# Patient Record
Sex: Female | Born: 1940 | Race: White | Hispanic: No | Smoking: Former smoker
Health system: Southern US, Community
[De-identification: ages and names within clinical notes are randomized; demographics above are authoritative.]

## PROBLEM LIST (undated history)

## (undated) DIAGNOSIS — M1990 Unspecified osteoarthritis, unspecified site: Secondary | ICD-10-CM

## (undated) DIAGNOSIS — J309 Allergic rhinitis, unspecified: Secondary | ICD-10-CM

## (undated) DIAGNOSIS — J449 Chronic obstructive pulmonary disease, unspecified: Secondary | ICD-10-CM

## (undated) DIAGNOSIS — M8580 Other specified disorders of bone density and structure, unspecified site: Secondary | ICD-10-CM

## (undated) DIAGNOSIS — N3281 Overactive bladder: Secondary | ICD-10-CM

## (undated) DIAGNOSIS — C4492 Squamous cell carcinoma of skin, unspecified: Secondary | ICD-10-CM

## (undated) DIAGNOSIS — I10 Essential (primary) hypertension: Secondary | ICD-10-CM

## (undated) DIAGNOSIS — G43909 Migraine, unspecified, not intractable, without status migrainosus: Secondary | ICD-10-CM

## (undated) DIAGNOSIS — F410 Panic disorder [episodic paroxysmal anxiety] without agoraphobia: Secondary | ICD-10-CM

## (undated) HISTORY — DX: Other specified disorders of bone density and structure, unspecified site: M85.80

## (undated) HISTORY — DX: Allergic rhinitis, unspecified: J30.9

## (undated) HISTORY — DX: Unspecified osteoarthritis, unspecified site: M19.90

## (undated) HISTORY — PX: CHOLECYSTECTOMY: SHX55

## (undated) HISTORY — DX: Migraine, unspecified, not intractable, without status migrainosus: G43.909

## (undated) HISTORY — DX: Chronic obstructive pulmonary disease, unspecified: J44.9

## (undated) HISTORY — PX: TOTAL ABDOMINAL HYSTERECTOMY W/ BILATERAL SALPINGOOPHORECTOMY: SHX83

## (undated) HISTORY — DX: Squamous cell carcinoma of skin, unspecified: C44.92

## (undated) HISTORY — DX: Overactive bladder: N32.81

## (undated) HISTORY — DX: Essential (primary) hypertension: I10

## (undated) HISTORY — DX: Panic disorder (episodic paroxysmal anxiety): F41.0

---

## 2002-02-05 ENCOUNTER — Encounter: Payer: Self-pay | Admitting: Family Medicine

## 2002-02-05 ENCOUNTER — Encounter: Admission: RE | Admit: 2002-02-05 | Discharge: 2002-02-05 | Payer: Self-pay | Admitting: Family Medicine

## 2002-06-19 ENCOUNTER — Encounter: Payer: Self-pay | Admitting: Family Medicine

## 2002-06-19 ENCOUNTER — Encounter: Admission: RE | Admit: 2002-06-19 | Discharge: 2002-06-19 | Payer: Self-pay | Admitting: Family Medicine

## 2003-06-21 ENCOUNTER — Encounter: Admission: RE | Admit: 2003-06-21 | Discharge: 2003-06-21 | Payer: Self-pay | Admitting: Family Medicine

## 2003-12-14 ENCOUNTER — Emergency Department (HOSPITAL_COMMUNITY): Admission: EM | Admit: 2003-12-14 | Discharge: 2003-12-14 | Payer: Self-pay | Admitting: Emergency Medicine

## 2004-07-29 ENCOUNTER — Ambulatory Visit: Payer: Self-pay | Admitting: Pulmonary Disease

## 2004-12-03 ENCOUNTER — Encounter: Admission: RE | Admit: 2004-12-03 | Discharge: 2004-12-03 | Payer: Self-pay | Admitting: Family Medicine

## 2004-12-10 ENCOUNTER — Encounter: Admission: RE | Admit: 2004-12-10 | Discharge: 2004-12-10 | Payer: Self-pay | Admitting: Family Medicine

## 2005-01-19 ENCOUNTER — Ambulatory Visit (HOSPITAL_COMMUNITY): Admission: RE | Admit: 2005-01-19 | Discharge: 2005-01-19 | Payer: Self-pay | Admitting: *Deleted

## 2005-01-19 ENCOUNTER — Encounter (INDEPENDENT_AMBULATORY_CARE_PROVIDER_SITE_OTHER): Payer: Self-pay | Admitting: *Deleted

## 2005-02-18 ENCOUNTER — Ambulatory Visit: Payer: Self-pay | Admitting: Pulmonary Disease

## 2005-07-27 ENCOUNTER — Encounter: Admission: RE | Admit: 2005-07-27 | Discharge: 2005-07-27 | Payer: Self-pay | Admitting: Psychiatry

## 2005-11-11 ENCOUNTER — Ambulatory Visit: Payer: Self-pay | Admitting: Pulmonary Disease

## 2005-12-25 ENCOUNTER — Encounter: Admission: RE | Admit: 2005-12-25 | Discharge: 2005-12-25 | Payer: Self-pay | Admitting: Family Medicine

## 2006-01-19 ENCOUNTER — Ambulatory Visit: Payer: Self-pay | Admitting: Pulmonary Disease

## 2006-01-21 ENCOUNTER — Ambulatory Visit: Payer: Self-pay | Admitting: Pulmonary Disease

## 2006-06-14 ENCOUNTER — Ambulatory Visit: Payer: Self-pay | Admitting: Pulmonary Disease

## 2006-09-25 ENCOUNTER — Emergency Department (HOSPITAL_COMMUNITY): Admission: EM | Admit: 2006-09-25 | Discharge: 2006-09-26 | Payer: Self-pay | Admitting: Emergency Medicine

## 2006-11-11 ENCOUNTER — Ambulatory Visit: Payer: Self-pay | Admitting: Pulmonary Disease

## 2006-12-28 ENCOUNTER — Encounter: Admission: RE | Admit: 2006-12-28 | Discharge: 2006-12-28 | Payer: Self-pay | Admitting: Family Medicine

## 2007-02-28 ENCOUNTER — Ambulatory Visit: Payer: Self-pay | Admitting: Emergency Medicine

## 2007-04-06 ENCOUNTER — Ambulatory Visit: Payer: Self-pay | Admitting: Emergency Medicine

## 2007-04-06 DIAGNOSIS — J449 Chronic obstructive pulmonary disease, unspecified: Secondary | ICD-10-CM

## 2007-07-14 HISTORY — PX: OTHER SURGICAL HISTORY: SHX169

## 2007-12-19 ENCOUNTER — Ambulatory Visit: Payer: Self-pay | Admitting: Internal Medicine

## 2007-12-30 ENCOUNTER — Encounter: Admission: RE | Admit: 2007-12-30 | Discharge: 2007-12-30 | Payer: Self-pay | Admitting: Family Medicine

## 2008-01-16 ENCOUNTER — Ambulatory Visit: Payer: Self-pay | Admitting: Emergency Medicine

## 2008-03-14 ENCOUNTER — Encounter: Payer: Self-pay | Admitting: Emergency Medicine

## 2008-05-29 ENCOUNTER — Ambulatory Visit: Payer: Self-pay | Admitting: Emergency Medicine

## 2008-06-26 ENCOUNTER — Ambulatory Visit (HOSPITAL_BASED_OUTPATIENT_CLINIC_OR_DEPARTMENT_OTHER): Admission: RE | Admit: 2008-06-26 | Discharge: 2008-06-26 | Payer: Self-pay | Admitting: Urology

## 2008-06-26 ENCOUNTER — Encounter: Payer: Self-pay | Admitting: Urology

## 2008-07-13 HISTORY — PX: OTHER SURGICAL HISTORY: SHX169

## 2008-09-11 ENCOUNTER — Ambulatory Visit: Payer: Self-pay | Admitting: Emergency Medicine

## 2008-12-31 ENCOUNTER — Encounter: Admission: RE | Admit: 2008-12-31 | Discharge: 2008-12-31 | Payer: Self-pay | Admitting: Family Medicine

## 2009-02-22 ENCOUNTER — Ambulatory Visit: Payer: Self-pay | Admitting: Emergency Medicine

## 2009-08-20 ENCOUNTER — Ambulatory Visit: Payer: Self-pay | Admitting: Emergency Medicine

## 2009-08-27 ENCOUNTER — Telehealth (INDEPENDENT_AMBULATORY_CARE_PROVIDER_SITE_OTHER): Payer: Self-pay | Admitting: *Deleted

## 2009-09-05 ENCOUNTER — Encounter: Payer: Self-pay | Admitting: Emergency Medicine

## 2009-09-23 ENCOUNTER — Encounter: Payer: Self-pay | Admitting: Emergency Medicine

## 2010-01-14 ENCOUNTER — Encounter: Admission: RE | Admit: 2010-01-14 | Discharge: 2010-01-14 | Payer: Self-pay | Admitting: Family Medicine

## 2010-02-19 ENCOUNTER — Ambulatory Visit: Payer: Self-pay | Admitting: Emergency Medicine

## 2010-04-10 ENCOUNTER — Telehealth (INDEPENDENT_AMBULATORY_CARE_PROVIDER_SITE_OTHER): Payer: Self-pay | Admitting: *Deleted

## 2010-04-24 ENCOUNTER — Telehealth (INDEPENDENT_AMBULATORY_CARE_PROVIDER_SITE_OTHER): Payer: Self-pay | Admitting: *Deleted

## 2010-07-10 ENCOUNTER — Encounter: Payer: Self-pay | Admitting: Emergency Medicine

## 2010-07-17 ENCOUNTER — Telehealth: Payer: Self-pay | Admitting: Emergency Medicine

## 2010-07-17 ENCOUNTER — Ambulatory Visit
Admission: RE | Admit: 2010-07-17 | Discharge: 2010-07-17 | Payer: Self-pay | Source: Home / Self Care | Attending: Emergency Medicine | Admitting: Emergency Medicine

## 2010-07-17 DIAGNOSIS — J9611 Chronic respiratory failure with hypoxia: Secondary | ICD-10-CM | POA: Insufficient documentation

## 2010-07-17 DIAGNOSIS — J9610 Chronic respiratory failure, unspecified whether with hypoxia or hypercapnia: Secondary | ICD-10-CM

## 2010-08-01 ENCOUNTER — Ambulatory Visit
Admission: RE | Admit: 2010-08-01 | Discharge: 2010-08-01 | Payer: Self-pay | Source: Home / Self Care | Attending: Emergency Medicine | Admitting: Emergency Medicine

## 2010-08-05 ENCOUNTER — Ambulatory Visit
Admission: RE | Admit: 2010-08-05 | Discharge: 2010-08-05 | Payer: Self-pay | Source: Home / Self Care | Attending: Emergency Medicine | Admitting: Emergency Medicine

## 2010-08-12 NOTE — Progress Notes (Signed)
Summary: prescript  for Advair---90 day supply  Phone Note Call from Patient   Caller: Patient Call For: byrum Summary of Call: pt is doing good on advair 250/50 would like prescript called to [facility]. [facility] [phone] Initial call taken by: Rickard Patience,  April 24, 2010 9:40 AM  Follow-up for Phone Call        called and spoke with pt.  per phone note on 04/10/2010, RB recommended pt restart Advair 250/50 and call us back in 2 weeks with feedback.  Pt states her breathing and cough have improved on Advair.  Pt requests a 90 day supply sent to her local pharmacy.  Will forward message to RB to make sure ok to send rx.  Aundra Millet Reynolds LPN  April 24, 2010 10:40 AM   Thank you, would go ahead and sent script Leslye Peer MD  April 24, 2010 11:20 AM  Follow-up by: Leslye Peer MD,  April 24, 2010 11:20 AM  Additional Follow-up for Phone Call Additional follow up Details #1::        rx sent to pharmacy.  pt aware.  Aundra Millet Reynolds LPN  April 24, 2010 11:48 AM     Prescriptions: ADVAIR DISKUS 250-50 MCG/DOSE  MISC (FLUTICASONE-SALMETEROL) inhale 1 puff two times a day  #180 x 3   Entered by:   Arman Filter LPN   Authorized by:   Leslye Peer MD   Signed by:   Arman Filter LPN on 42/59/5638   Method used:   [facility] to        [facility]  [phone]* (retail)       [address] [place], [place]  [address]       Ph: [phone] or [phone]       Fax: [phone]   RxID:   [id number]

## 2010-08-12 NOTE — Progress Notes (Signed)
Summary: resume using advair? coughing/ SOB  Phone Note Call from Patient Call back at [facility] Phone [phone]   Caller: Patient Call For: BYRUM Summary of Call: pt has begun using her inhaler again (which she hasn't had to use in yrs). also coughing again and having trouble breathing while in shower. wants to discuss getting back on advair.  [facility] Initial call taken by: Tivis Ringer, CNA,  April 10, 2010 9:43 AM  Follow-up for Phone Call        Called and spoke with pt.  She states that since she stopped the advair, she has had increased SOB with exertion and now has developed cough- mainly dry but sometimes produces clear sputum.  She has had to use rescue inhaler at least two times a day the past wk.  Would like to discuss starting back on advair.  Pls advise thanks Follow-up by: Vernie Murders,  April 10, 2010 10:42 AM  Additional Follow-up for Phone Call Additional follow up Details #1::        Please ask patient to restart her Advair, then call us in 2 weeks to see if she improves. Thanks Leslye Peer MD  April 10, 2010 5:07 PM  Additional Follow-up by: Leslye Peer MD,  April 10, 2010 5:07 PM    Additional Follow-up for Phone Call Additional follow up Details #2::    Called and spoke with pt and advised her to restart her advair. I advised pt to call us back in 2 weeks to give [place] feedback on how well she is doing since starting back advair. Pt advised me she would.  Carver Fila  April 10, 2010 5:11 PM

## 2010-08-12 NOTE — Assessment & Plan Note (Signed)
Summary: COPD   Visit Type:  Follow-up Primary Provider/Referring Provider:  Ehinger  CC:  COPD.  She says her breathing is doing well. No complaints today.Marland Kitchen  History of Present Illness: 70 year old female with severe COPD maintained on Advair and Spiriva.  Has been on exertional O2.  ROV 02/22/09 -- regular visit, has been doing well. Wearing her O2 with exertion. Taking Spiriva and Advair. Still very active. Hasn't restarted cigarettes, quit 4/09.  No flares or exacerbations since last visit.  Rarely uses as needed SABA.   ROV 08/20/09 -- Returns feeling well. Hasn't restarted smoking!  Hasn't been using her O2 with exertion. Using Spiriva and Advair.  No exacerbations since last visit. Able to exert without limitations. No wheezing. Sometimes has cough, usually related to nasal drainage. Hasn't needed her SABA.   ROV 02/19/10 -- regular f/u for COPD. She has been doing well, able to exert, do yardwork. Not smoking. No exacerbations.      Current Medications (verified): 1)  Advair Diskus 250-50 Mcg/dose  Misc (Fluticasone-Salmeterol) .... Inhale 1 Puff Two Times A Day 2)  Spiriva Handihaler 18 Mcg  Caps (Tiotropium Bromide Monohydrate) .... Inhale 1 Capule Once Daily 3)  Multivitamins   Tabs (Multiple Vitamin) .... Take 1 Tablet By Mouth Once A Day 4)  Calcium 600 600 Mg  Tabs (Calcium Carbonate) .... Take 1 Tablet By Mouth Once A Day 5)  Vitamin D3 2000 Unit Caps (Cholecalciferol) .Marland Kitchen.. 1 By Mouth Daily  Allergies (verified): 1)  ! Erythromycin 2)  ! Ceclor 3)  ! Biaxin 4)  ! Iodine  Vital Signs:  Patient profile:   70 year old female Height:      66 inches (167.64 cm) Weight:      132 pounds (60.00 kg) BMI:     21.38 O2 Sat:      94 % on Room air Temp:     98.3 degrees F (36.83 degrees C) oral Pulse rate:   89 / minute BP sitting:   134 / 82  (left arm) Cuff size:   regular  Vitals Entered By: Michel Bickers CMA (February 19, 2010 10:49 AM)  O2 Sat at Rest %:  94 O2 Flow:   Room air CC: COPD.  She says her breathing is doing well. No complaints today. Comments Medications reviewed with the patient. Daytime phone verified. Michel Bickers CMA  February 19, 2010 10:49 AM   Physical Exam  General:  well developed, well nourished, in no acute distress Head:  normocephalic and atraumatic Eyes:  conjunctiva and sclera clear Nose:  no deformity, discharge, inflammation, or lesions Mouth:  no exudate Neck:  no masses, thyromegaly, or abnormal cervical nodes Lungs:  distant but clear, no wheeze Heart:  regular rate and rhythm, S1, S2 without murmurs, rubs, gallops, or clicks Abdomen:  not examined Msk:  no deformity or scoliosis noted with normal posture Extremities:  trace pretibial edema.  Neurologic:  non-focal.  Skin:  intact without lesions or rashes Psych:  alert and cooperative; normal mood and affect; normal attention span and concentration   Medications Added to Medication List This Visit: 1)  Proair Hfa 108 (90 Base) Mcg/act Aers (Albuterol sulfate) .... 2 puffs q4h as needed for sob  Other Orders: Est. Patient Level IV ([address]) Prescription Created Electronically [phone])  Patient Instructions: 1)  Continue your Spiriva daily 2)  Stop your Advair for now. We will see how you do off this medication.  3)  If your breathing worsens off  the Advair, call our office so we can discuss restarting.  4)  Use ProAir as needed.  5)  Flu shot in the Fall.  6)  Follow up with Dr Delton Coombes in 6 months or as needed.  Prescriptions: PROAIR HFA 108 (90 BASE) MCG/ACT AERS (ALBUTEROL SULFATE) 2 puffs q4h as needed for SOB  #1 x 5   Entered and Authorized by:   Leslye Peer MD   Signed by:   Leslye Peer MD on 02/19/2010   Method used:   [facility] to        [facility]  [phone][facility] (retail)       [address] [place], [place]  [address]       Ph: [phone] or [phone]       Fax: [phone]   RxID:   [id number]

## 2010-08-12 NOTE — Assessment & Plan Note (Signed)
Summary: COPD   Visit Type:  Follow-up Primary Provider/Referring Provider:  Ehinger  CC:  COPD. The patient states her breathing is doing great. No changes..  History of Present Illness: 70 year old female with severe COPD maintained on Advair and Spiriva.  Has been on exertional O2.  ROV 02/22/09 -- regular visit, has been doing well. Wearing her O2 with exertion. Taking Spiriva and Advair. Still very active. Hasn't restarted cigarettes, quit 4/09.  No flares or exacerbations since last visit.  Rarely uses as needed SABA.   ROV 08/20/09 -- Returns feeling well. Hasn't restarted smoking!  Hasn't been using her O2 with exertion. Using Spiriva and Advair.  No exacerbations since last visit. Able to exert without limitations. No wheezing. Sometimes has cough, usually related to nasal drainage. Hasn't needed her SABA.      Current Medications (verified): 1)  Advair Diskus 250-50 Mcg/dose  Misc (Fluticasone-Salmeterol) .... Inhale 1 Puff Two Times A Day 2)  Spiriva Handihaler 18 Mcg  Caps (Tiotropium Bromide Monohydrate) .... Inhale 1 Capule Once Daily 3)  Multivitamins   Tabs (Multiple Vitamin) .... Take 1 Tablet By Mouth Once A Day 4)  Calcium 600 600 Mg  Tabs (Calcium Carbonate) .... Take 1 Tablet By Mouth Once A Day 5)  Proventil Hfa 108 (90 Base) Mcg/act  Aers (Albuterol Sulfate) .... 2 Puffs Every 4 Hours As Needed For Shortness of Breath 6)  Vitamin D3 2000 Unit Caps (Cholecalciferol) .Marland Kitchen.. 1 By Mouth Daily  Allergies (verified): 1)  ! Erythromycin 2)  ! Ceclor 3)  ! Biaxin 4)  ! Iodine  Vital Signs:  Patient profile:   70 year old female Height:      66 inches (167.64 cm) Weight:      134.50 pounds (61.14 kg) BMI:     21.79 O2 Sat:      98 % on Room air Temp:     97.7 degrees F (36.50 degrees C) oral Pulse rate:   90 / minute BP sitting:   112 / 74  (left arm) Cuff size:   regular  Vitals Entered By: Michel Bickers CMA (August 20, 2009 10:15 AM)  O2 Sat at Rest %:  98 O2  Flow:  Room air  Serial Vital Signs/Assessments:  Comments: 10:59 AM Ambulatory Pulse Oximetry  Resting; HR__90___    02 Sat_____ 97 Lap1 (185 feet)   HR__90___   02 Sat__96___ Lap2 (185 feet)   HR__105___   02 Sat__94___    Lap3 (185 feet)   HR___106_   02 Sat__94___  _x__Test Completed without Difficulty ___Test Stopped due to:  By: Michel Bickers CMA    Physical Exam  General:  well developed, well nourished, in no acute distress Head:  normocephalic and atraumatic Eyes:  conjunctiva and sclera clear Nose:  no deformity, discharge, inflammation, or lesions Mouth:  no exudate Neck:  no masses, thyromegaly, or abnormal cervical nodes Lungs:  distant but clear, no wheeze Heart:  regular rate and rhythm, S1, S2 without murmurs, rubs, gallops, or clicks Abdomen:  not examined Msk:  no deformity or scoliosis noted with normal posture Extremities:  trace pretibial edema.  Neurologic:  non-focal.  Skin:  intact without lesions or rashes Psych:  alert and cooperative; normal mood and affect; normal attention span and concentration   Impression & Recommendations:  Problem # 1:  COPD (ICD-496) Well-managed, but not clear to me that she should be exerting on RA.  - repeat walking oximetry today and discuss her O2  use - same BD's - f/u 6 months or as needed.   Medications Added to Medication List This Visit: 1)  Vitamin D3 2000 Unit Caps (Cholecalciferol) .Marland Kitchen.. 1 by mouth daily  Other Orders: Est. Patient Level III (09323)  Patient Instructions: 1)  Continue your Spiriva and Advair as your are taking them.  2)  Walking oximetry today showed that your level does not drop with exertion. 3)  Use your Proventil as needed  4)  Follow up with Dr Delton Coombes in 6 months or as needed.

## 2010-08-12 NOTE — Progress Notes (Signed)
Summary: Records request from [facility]  Request for records received from [facility]. Request forwarded to [employer]. Wilder Glade  August 27, 2009 12:55 PM

## 2010-08-12 NOTE — Miscellaneous (Signed)
Summary: Oxygen Recertification Notice  Oxygen Recertification Notice   Imported By: Lenard Forth 11/08/2009 12:32:34  _____________________________________________________________________  External Attachment:    Type:   Image     Comment:   External Document

## 2010-08-12 NOTE — Letter (Signed)
Summary: CMN for Oxygen/[facility]  CMN for Oxygen/[facility]   Imported By: Sherian Rein 09/25/2009 12:20:50  _____________________________________________________________________  External Attachment:    Type:   Image     Comment:   External Document

## 2010-08-13 ENCOUNTER — Ambulatory Visit (INDEPENDENT_AMBULATORY_CARE_PROVIDER_SITE_OTHER): Payer: Medicare Other | Admitting: Adult Health

## 2010-08-13 ENCOUNTER — Encounter: Payer: Self-pay | Admitting: Adult Health

## 2010-08-13 DIAGNOSIS — J9610 Chronic respiratory failure, unspecified whether with hypoxia or hypercapnia: Secondary | ICD-10-CM

## 2010-08-13 DIAGNOSIS — J449 Chronic obstructive pulmonary disease, unspecified: Secondary | ICD-10-CM

## 2010-08-14 NOTE — Assessment & Plan Note (Signed)
Summary: Acute NP office visit - COPD   Primary Provider/Referring Provider:  Ehinger  CC:  prod cough with yellow mucus and sore throat x49month - was given amoxicillin by PCP and pred taper by MW.  states she has improved but is not back to baseline.Marland Kitchen  History of Present Illness: 67 yowf quit smoking 2009  with severe COPD maintained on Advair and Spiriva.  Has been on exertional O2.  ROV 02/22/09 -- regular visit, has been doing well. Wearing her O2 with exertion. Taking Spiriva and Advair. Still very active. Hasn't restarted cigarettes, quit 4/09.  No flares or exacerbations since last visit.  Rarely uses as needed SABA.   ROV 08/20/09 -- Returns feeling well. Hasn't restarted smoking!  Hasn't been using her O2 with exertion. Using Spiriva and Advair.  No exacerbations since last visit. Able to exert without limitations. No wheezing. Sometimes has cough, usually related to nasal drainage. Hasn't needed her SABA.   ROV 02/19/10 -- regular f/u for COPD. She has been doing well, able to exert, do yardwork. Not smoking. No exacerbations. Continue your Spiriva daily Stop your Advair for now. We will see how you do off this medication.  If your breathing worsens off the Advair,  restarted  Use ProAir as needed.  July 17, 2010 ov acute onset x 8 days drippy nose worse short of breath.  forgetting am advair but consistent with spiriva. hasn't taken proaire yet as per Dr Kavin Leech instructions ? why?  rx by Ehinger with amox > mucus less yelow, still coughing violently and used up a half bottle of hycodan already.>>tx w/ steorid taper , changed to [facility]  August 01, 2010--Returns for an acute office visit. Complains of prod cough with yellow mucus, sore throat x50month - was given amoxicillin by PCP and pred taper by MW.  states she has improved but is not back to baseline. Still cough alot. Prednisone helped alot w/ decreased cough. Denies chest pain,  orthopnea, hemoptysis, fever, n/v/d, edema,  headache.   Medications Prior to Update: 1)  Spiriva Handihaler 18 Mcg  Caps (Tiotropium Bromide Monohydrate) .... Inhale 1 Capule Once Daily 2)  Multivitamins   Tabs (Multiple Vitamin) .... Take 1 Tablet By Mouth Once A Day 3)  Calcium 600 600 Mg  Tabs (Calcium Carbonate) .... Take 1 Tablet By Mouth Once A Day 4)  Vitamin D3 2000 Unit Caps (Cholecalciferol) .Marland Kitchen.. 1 By Mouth Daily 5)  Proair Hfa 108 (90 Base) Mcg/act Aers (Albuterol Sulfate) .... 2 Puffs Q4h As Needed For Sob 6)  Alprazolam 0.5 Mg Tabs (Alprazolam) .Marland Kitchen.. 1 Every 6 Hrs As Needed 7)  Sumatriptan Succinate 100 Mg Tabs (Sumatriptan Succinate) .Marland Kitchen.. 1 Once Daily As Needed For Migraine 8)  Mucinex 600 Mg Xr12h-Tab (Guaifenesin) .Marland Kitchen.. 1 Every 12 Hrs As Needed 9)  Amoxicillin 500 Mg Caps (Amoxicillin) .... 2 Two Times A Day Per Dr Manus Gunning 10)  Hycodan Syrup .Marland Kitchen.. 1 Tsp Every 6 Hrs As Needed 11)  Dulera 200-5 Mcg/act Aero (Mometasone Furo-Formoterol Fum) .... 2 Puffs First Thing  in Am and 2 Puffs Again in Pm About 12 Hours Later 12)  Prednisone 10 Mg  Tabs (Prednisone) .... 4 Each Am X 2 Days,  3 X 2days, 2x2 Days, and 1x2 Days  Current Medications (verified): 1)  Spiriva Handihaler 18 Mcg  Caps (Tiotropium Bromide Monohydrate) .... Inhale 1 Capule Once Daily 2)  Multivitamins   Tabs (Multiple Vitamin) .... Take 1 Tablet By Mouth Once A Day 3)  Calcium  600 600 Mg  Tabs (Calcium Carbonate) .... Take 1 Tablet By Mouth Once A Day 4)  Vitamin D3 2000 Unit Caps (Cholecalciferol) .Marland Kitchen.. 1 By Mouth Daily 5)  Proair Hfa 108 (90 Base) Mcg/act Aers (Albuterol Sulfate) .... 2 Puffs Q4h As Needed For Sob 6)  Alprazolam 0.5 Mg Tabs (Alprazolam) .Marland Kitchen.. 1 Every 6 Hrs As Needed 7)  Sumatriptan Succinate 100 Mg Tabs (Sumatriptan Succinate) .Marland Kitchen.. 1 Once Daily As Needed For Migraine 8)  Mucinex 600 Mg Xr12h-Tab (Guaifenesin) .Marland Kitchen.. 1 Every 12 Hrs As Needed 9)  Hycodan Syrup .Marland Kitchen.. 1 Tsp Every 6 Hrs As Needed 10)  Dulera 200-5 Mcg/act Aero (Mometasone  Furo-Formoterol Fum) .... 2 Puffs First Thing  in Am and 2 Puffs Again in Pm About 12 Hours Later  Allergies (verified): 1)  ! Erythromycin 2)  ! Ceclor 3)  ! Biaxin 4)  ! Iodine  Past History:  Past Medical History: Last updated: 07/17/2010 COPD     - HFA 75% p coaching July 17, 2010   Family History: Last updated: December 31, 2007 mother died age 105 from cancer father died age 43 from heart attack 2 siblings 1 brother alive age 97  excellent health 1 sister  alive age 52 hx of heart problems and  cancer  Social History: Last updated: 08/01/2010 retired former smoker - quit 2009 married 3 children  Risk Factors: Smoking Status: quit (04/06/2007)  Social History: retired former smoker - quit 2009 married 3 children  Review of Systems      See HPI  Vital Signs:  Patient profile:   70 year old female Height:      66 inches Weight:      135.13 pounds BMI:     21.89 O2 Sat:      92 % on Room air Temp:     98.8 degrees F oral Pulse rate:   103 / minute BP sitting:   142 / 78  (left arm) Cuff size:   regular  Vitals Entered By: Boone Master CNA/MA (August 01, 2010 4:09 PM)  O2 Flow:  Room air CC: prod cough with yellow mucus, sore throat x8month - was given amoxicillin by PCP and pred taper by MW.  states she has improved but is not back to baseline. Is Patient Diabetic? No Comments Medications reviewed with patient Daytime contact number verified with patient. Boone Master CNA/MA  August 01, 2010 4:10 PM    Physical Exam  Additional Exam:  HEENT mild turbinate edema.  Oropharynx no thrush or excess pnd or cobblestoning.  No JVD or cervical adenopathy. Mild accessory muscle hypertrophy. Trachea midline, nl thryroid. Chest was hyperinflated by percussion with diminished breath sounds and moderate increased exp time without wheeze. Hoover sign positive at mid inspiration. Regular rate and rhythm without murmur gallop or rub or increase P2 or edema.  Abd: no  hsm, nl excursion. Ext warm without cyanosis or clubbing.     Impression & Recommendations:  Problem # 1:  COPD (ICD-496) Slow to resolve exacerbation  xray pending.  albuterol neb in office  Plan;  Prednisone taper over next week.  Mucinex two times a day for congestion Delsym 2 tsp two times a day as needed cough  Increase fliuids and rest.  Please contact office for sooner follow up if symptoms do not improve or worsen   Medications Added to Medication List This Visit: 1)  Prednisone 10 Mg Tabs (Prednisone) .... 4 tabs for 2 days, then 3 tabs for 2 days, 2  tabs for 2 days, then 1 tab for 2 days, then stop  Other Orders: T-2 View CXR (71020TC) Est. Patient Level IV ([address]109)  Patient Instructions: 1)  Prednisone taper over next week.  2)  Mucinex two times a day for congestion 3)  Delsym 2 tsp two times a day as needed cough  4)  Increase fliuids and rest.  5)  Please contact office for sooner follow up if symptoms do not improve or worsen  Prescriptions: PREDNISONE 10 MG TABS (PREDNISONE) 4 tabs for 2 days, then 3 tabs for 2 days, 2 tabs for 2 days, then 1 tab for 2 days, then stop  #20 x 0   Entered and Authorized by:   Rubye Oaks NP   Signed by:   Rubye Oaks NP on 08/01/2010   Method used:   [facility]        [facility]  [phone]* (retail)       [address] [place], [place]  [address]       Ph: [phone] or [phone]       Fax: [phone]   RxID:   [id number]    Immunization History:  Influenza Immunization History:    Influenza:  historical (05/24/2010)

## 2010-08-14 NOTE — Assessment & Plan Note (Signed)
Summary: COPD, URI   Visit Type:  Follow-up Primary Provider/Referring Provider:  Manus Gunning  CC:  Followup COPD after 2 acute visits this month.  Pt states that her breathing has improved but not quite back to baseline.  Still c/o prod cough with yellow sputum.  She states that she had fever 3 days ago.  Marland Kitchen  History of Present Illness: 79 yowf quit smoking 2009  with severe COPD maintained on Advair and Spiriva.  Has been on exertional O2.  ROV 02/19/10 -- regular f/u for COPD. She has been doing well, able to exert, do yardwork. Not smoking. No exacerbations. Continue your Spiriva daily Stop your Advair for now. We will see how you do off this medication.  If your breathing worsens off the Advair,  restarted  Use ProAir as needed.  July 17, 2010 ov acute onset x 8 days drippy nose worse short of breath.  forgetting am advair but consistent with spiriva. hasn't taken proaire yet as per Dr Kavin Leech instructions ? why?  rx by Ehinger with amox > mucus less yelow, still coughing violently and used up a half bottle of hycodan already.>>tx w/ steorid taper , changed to [facility]  August 01, 2010--Returns for an acute office visit. Complains of prod cough with yellow mucus, sore throat x32month - was given amoxicillin by PCP and pred taper by MW.  states she has improved but is not back to baseline. Still cough alot. Prednisone helped alot w/ decreased cough. Denies chest pain,  orthopnea, hemoptysis, fever, n/v/d, edema, headache.   ROV 08/05/10 -- severe COPD, exacerbated in the last month with apparent URI. Was treated initially with abx, then with steroid taper (finished mid Jan). Changed from Advair to [facility] for possible contributipon to coughing. Started another pred taper for continued URI signs, yellow sputum, yellow nasal congestion, occas nasal blood. Cough is slightly better.   Current Medications (verified): 1)  Multivitamins   Tabs (Multiple Vitamin) .... Take 1 Tablet By Mouth Once A  Day 2)  Calcium 600 600 Mg  Tabs (Calcium Carbonate) .... Take 1 Tablet By Mouth Once A Day 3)  Vitamin D3 2000 Unit Caps (Cholecalciferol) .Marland Kitchen.. 1 By Mouth Daily 4)  Proair Hfa 108 (90 Base) Mcg/act Aers (Albuterol Sulfate) .... 2 Puffs Q4h As Needed For Sob 5)  Alprazolam 0.5 Mg Tabs (Alprazolam) .Marland Kitchen.. 1 Every 6 Hrs As Needed 6)  Sumatriptan Succinate 100 Mg Tabs (Sumatriptan Succinate) .Marland Kitchen.. 1 Once Daily As Needed For Migraine 7)  Mucinex 600 Mg Xr12h-Tab (Guaifenesin) .Marland Kitchen.. 1 Every 12 Hrs As Needed 8)  Dulera 200-5 Mcg/act Aero (Mometasone Furo-Formoterol Fum) .... 2 Puffs First Thing  in Am and 2 Puffs Again in Pm About 12 Hours Later 9)  Prednisone 10 Mg Tabs (Prednisone) .... 4 Tabs For 2 Days, Then 3 Tabs For 2 Days, 2 Tabs For 2 Days, Then 1 Tab For 2 Days, Then Stop  Allergies (verified): 1)  ! Erythromycin 2)  ! Ceclor 3)  ! Biaxin 4)  ! Iodine  Vital Signs:  Patient profile:   70 year old female Weight:      135 pounds O2 Sat:      97 % on Room air Temp:     98.0 degrees F oral Pulse rate:   101 / minute BP sitting:   162 / 90  (left arm)  Vitals Entered By: Vernie Murders (August 05, 2010 12:04 PM)  O2 Flow:  Room air  Physical Exam  General:  well  developed, well nourished, in no acute distress Head:  normocephalic and atraumatic Eyes:  conjunctiva and sclera clear Nose:  no deformity, discharge, inflammation, or lesions Mouth:  no exudate Neck:  no masses, thyromegaly, or abnormal cervical nodes Lungs:  distant but clear, no wheeze Heart:  regular rate and rhythm, S1, S2 without murmurs, rubs, gallops, or clicks Abdomen:  not examined Msk:  no deformity or scoliosis noted with normal posture Extremities:  trace pretibial edema.  Neurologic:  non-focal.  Skin:  intact without lesions or rashes Psych:  alert and cooperative; normal mood and affect; normal attention span and concentration   Impression & Recommendations:  Problem # 1:  COPD (ICD-496) With  another URI, being rx for AE - finish pred - nasal hygiene - decongestants - rov 2 weeks  Other Orders: Est. Patient Level IV (16109)  Patient Instructions: 1)  Finish your prednisone.  2)  Continue the [facility] and Spiriva; we will consider changing back to Advar in the future.  3)  Use nasal saline washes once daily for 2 weeks 4)  Try decongestants that contain either bromphenerimine or chlorphenerimine as directed  5)  Follow with Dr Delton Coombes in 2 weeks

## 2010-08-14 NOTE — Assessment & Plan Note (Signed)
Summary: Pulmonary/ acute ext ov with hfa 75%   Primary Provider/Referring Provider:  Ehinger  CC:  Increased SOB and prod cough with yellow sputum and wheezing x 1 wk. .  History of Present Illness: 26 yowf quit smoking 2009  with severe COPD maintained on Advair and Spiriva.  Has been on exertional O2.  ROV 02/22/09 -- regular visit, has been doing well. Wearing her O2 with exertion. Taking Spiriva and Advair. Still very active. Hasn't restarted cigarettes, quit 4/09.  No flares or exacerbations since last visit.  Rarely uses as needed SABA.   ROV 08/20/09 -- Returns feeling well. Hasn't restarted smoking!  Hasn't been using her O2 with exertion. Using Spiriva and Advair.  No exacerbations since last visit. Able to exert without limitations. No wheezing. Sometimes has cough, usually related to nasal drainage. Hasn't needed her SABA.   ROV 02/19/10 -- regular f/u for COPD. She has been doing well, able to exert, do yardwork. Not smoking. No exacerbations. Continue your Spiriva daily Stop your Advair for now. We will see how you do off this medication.  If your breathing worsens off the Advair,  restarted  Use ProAir as needed.  July 17, 2010 ov acute onset x 8 days drippy nose worse short of breath.  forgetting am advair but consistent with spiriva. hasn't taken proaire yet as per Dr Kavin Leech instructions ? why?  rx by Ehinger with amox > mucus less yelow, still coughing violently and used up a half bottle of hycodan already. Pt denies any significant sore throat, dysphagia, itching, sneezing,  nasal congestion or excess secretions,  fever, chills, sweats, unintended wt loss, pleuritic or exertional cp, hempoptysis,  orthopnea pnd or leg swelling. Pt also denies any obvious fluctuation in symptoms with weather or environmental change or other alleviating or aggravating factors.          Current Medications (verified): 1)  Advair Diskus 250-50 Mcg/dose  Misc (Fluticasone-Salmeterol) ....  Inhale 1 Puff Two Times A Day 2)  Spiriva Handihaler 18 Mcg  Caps (Tiotropium Bromide Monohydrate) .... Inhale 1 Capule Once Daily 3)  Multivitamins   Tabs (Multiple Vitamin) .... Take 1 Tablet By Mouth Once A Day 4)  Calcium 600 600 Mg  Tabs (Calcium Carbonate) .... Take 1 Tablet By Mouth Once A Day 5)  Vitamin D3 2000 Unit Caps (Cholecalciferol) .Marland Kitchen.. 1 By Mouth Daily 6)  Proair Hfa 108 (90 Base) Mcg/act Aers (Albuterol Sulfate) .... 2 Puffs Q4h As Needed For Sob 7)  Alprazolam 0.5 Mg Tabs (Alprazolam) .Marland Kitchen.. 1 Every 6 Hrs As Needed 8)  Sumatriptan Succinate 100 Mg Tabs (Sumatriptan Succinate) .Marland Kitchen.. 1 Once Daily As Needed For Migraine 9)  Mucinex 600 Mg Xr12h-Tab (Guaifenesin) .Marland Kitchen.. 1 Every 12 Hrs As Needed 10)  Amoxicillin 500 Mg Caps (Amoxicillin) .... 2 Two Times A Day Per Dr Manus Gunning 11)  Hycodan Syrup .Marland Kitchen.. 1 Tsp Every 6 Hrs As Needed  Allergies (verified): 1)  ! Erythromycin 2)  ! Ceclor 3)  ! Biaxin 4)  ! Iodine  Past History:  Past Medical History: COPD     - HFA 75% p coaching July 17, 2010   Vital Signs:  Patient profile:   70 year old female Weight:      135.50 pounds O2 Sat:      93 % on 3 L/min pulsed Temp:     98.2 degrees F oral Pulse rate:   102 / minute BP sitting:   146 / 76  (left arm)  Vitals Entered By: Vernie Murders (July 17, 2010 11:55 AM)  O2 Flow:  3 L/min pulsed CC: Increased SOB, prod cough with yellow sputum and wheezing x 1 wk.    Physical Exam  Additional Exam:  very anxious amb wf pink puffer pattern breathing wt 234 > 132 July 17, 2010  HEENT mild turbinate edema.  Oropharynx no thrush or excess pnd or cobblestoning.  No JVD or cervical adenopathy. Mild accessory muscle hypertrophy. Trachea midline, nl thryroid. Chest was hyperinflated by percussion with diminished breath sounds and moderate increased exp time without wheeze. Hoover sign positive at mid inspiration. Regular rate and rhythm without murmur gallop or rub or increase P2 or  edema.  Abd: no hsm, nl excursion. Ext warm without cyanosis or clubbing.     Impression & Recommendations:  Problem # 1:  COPD (ICD-496)   DDX of  difficult airways managment all start with A and  include Adherence, Ace Inhibitors, Acid Reflux, Active Sinus Disease, Alpha 1 Antitripsin deficiency, Anxiety masquerading as Airways dz,  ABPA,  allergy(esp in young), Aspiration (esp in elderly), Adverse effects of DPI,  Active smokers, plus one B  = Beta blocker use..    Adherence:  not following Dr Kavin Leech instructions, not yet on proaire as needed, not yet used the advair Rec trial of dulera 200 .  I spent extra time with the patient today explaining optimal mdi  technique.  This improved from  50-75% p coaching  ? adverse effect of Advair (excess cough in setting of relatively benign uri) > try off  ? acid reflux (excess cough) > short term rx.  Problem # 2:  RESPIRATORY FAILURE, CHRONIC (ICD-518.83) Relatively well compensated despite aecopd  Medications Added to Medication List This Visit: 1)  Alprazolam 0.5 Mg Tabs (Alprazolam) .Marland Kitchen.. 1 every 6 hrs as needed 2)  Sumatriptan Succinate 100 Mg Tabs (Sumatriptan succinate) .Marland Kitchen.. 1 once daily as needed for migraine 3)  Mucinex 600 Mg Xr12h-tab (Guaifenesin) .Marland Kitchen.. 1 every 12 hrs as needed 4)  Amoxicillin 500 Mg Caps (Amoxicillin) .... 2 two times a day per dr Manus Gunning 5)  Hycodan Syrup  .Marland Kitchen.. 1 tsp every 6 hrs as needed 6)  Dulera 200-5 Mcg/act Aero (Mometasone furo-formoterol fum) .... 2 puffs first thing  in am and 2 puffs again in pm about 12 hours later 7)  Prednisone 10 Mg Tabs (Prednisone) .... 4 each am x 2 days,  3 x 2days, 2x2 days, and 1x2 days  Other Orders: Prescription Created Electronically [id number]) Est. Patient Level IV (60454) HFA Instruction [id number])  Patient Instructions: 1)  Stop advair for now  2)  Start dulera 200 2 puffs first thing  in am and 2 puffs again in pm about 12 hours later and Work on inhaler technique:  relax  and blow all the way out then take a nice smooth deep breath back in, triggering the inhaler at same time you start breathing in  3)  Prednisone x 8 days taper 4)  Prilosec before bfast and pepcid 20 mg at bedtime as long as coughing ( reflux is to cough what oxygen is to fire)  5)  Dr Delton Coombes 2-3 weeks sooner if needed Prescriptions: PREDNISONE 10 MG  TABS (PREDNISONE) 4 each am x 2 days,  3 x 2days, 2x2 days, and 1x2 days  #20 x 0   Entered and Authorized by:   Nyoka Cowden MD   Signed by:   Nyoka Cowden MD on 07/17/2010  Method used:   Electronically to        [facility]  [phone]* (retail)       [address] [place], [place]  [address]       Ph: [phone] or [phone]       Fax: [phone]   RxID:   [id number]

## 2010-08-14 NOTE — Progress Notes (Signed)
Summary: sick  Phone Note Call from Patient Call back at Home Phone [phone]   Caller: Patient Call For: byrum Reason for Call: Talk to Nurse Summary of Call: Patient went to primary care doctor last week for what she thought was a cold and was prescribed abx. She still has 2 days remaining. Patient states she has not gotten any better--cough, sob, wheezing, chest tightness.  Patient calling requesting appt.   Initial call taken by: Lehman Prom,  July 17, 2010 8:40 AM  Follow-up for Phone Call        pt states she saw her pcp x 1 week ago for chest congestion, productive cough, and SOB and he gave hre rx for amoxicilin x10days. pt has completed 7 days of the course with no relief. She states she feel sworse. She c/o increased SOb and chest congestion and she has increased the use of her home O2 as well.  pt is requesting a n appt, so I set her to see MW today at 11:45. Carron Curie CMA  July 17, 2010 9:05 AM

## 2010-08-14 NOTE — Letter (Signed)
Summary: [facility]   Imported By: Sherian Rein 07/29/2010 15:50:09  _____________________________________________________________________  External Attachment:    Type:   Image     Comment:   External Document

## 2010-08-28 ENCOUNTER — Encounter: Payer: Self-pay | Admitting: Emergency Medicine

## 2010-08-28 ENCOUNTER — Ambulatory Visit (INDEPENDENT_AMBULATORY_CARE_PROVIDER_SITE_OTHER): Payer: Medicare Other | Admitting: Emergency Medicine

## 2010-08-28 DIAGNOSIS — J069 Acute upper respiratory infection, unspecified: Secondary | ICD-10-CM

## 2010-08-28 DIAGNOSIS — J449 Chronic obstructive pulmonary disease, unspecified: Secondary | ICD-10-CM

## 2010-08-28 NOTE — Assessment & Plan Note (Addendum)
Summary: Acute NP office visit - COPD   Vital Signs:  Patient profile:   70 year old female Height:      66 inches Weight:      138.50 pounds BMI:     22.44 O2 Sat:      95 % on Room air Temp:     99.3 degrees F oral Pulse rate:   94 / minute BP sitting:   136 / 80  (left arm) Cuff size:   regular  Vitals Entered By: Boone Master CNA/MA (August 13, 2010 11:37 AM)  O2 Flow:  Room air CC: still sick - states not coughing as much but mucus has turned green in color, low grade temp, sinus congestion - states she thinks she began declining again after finishing the pred taper.  denies wheezing, SOB Is Patient Diabetic? No Comments Medications reviewed with patient Daytime contact number verified with patient. Boone Master CNA/MA  August 13, 2010 11:38 AM    Primary Provider/Referring Provider:  Manus Gunning  CC:  still sick - states not coughing as much but mucus has turned green in color, low grade temp, sinus congestion - states she thinks she began declining again after finishing the pred taper.  denies wheezing, and SOB.  History of Present Illness: 89 yowf quit smoking 2009  with severe COPD maintained on Advair and Spiriva.  Has been on exertional O2. 2009>> FEV1 of1.35 or 59% of predicted, an FVC of 3.1 or 98% of predicted, and a ratio of 43% consistent with severe airflow limitation.  ROV 02/19/10 -- regular f/u for COPD. She has been doing well, able to exert, do yardwork. Not smoking. No exacerbations. Continue your Spiriva daily Stop your Advair for now. We will see how you do off this medication.  If your breathing worsens off the Advair,  restarted  Use ProAir as needed.  July 17, 2010 ov acute onset x 8 days drippy nose worse short of breath.  forgetting am advair but consistent with spiriva. hasn't taken proaire yet as per Dr Kavin Leech instructions ? why?  rx by Ehinger with amox > mucus less yelow, still coughing violently and used up a half bottle of hycodan  already.>>tx w/ steorid taper , changed to [facility]  August 01, 2010--Returns for an acute office visit. Complains of prod cough with yellow mucus, sore throat x3month - was given amoxicillin by PCP and pred taper by MW.  states she has improved but is not back to baseline. Still cough alot. Prednisone helped alot w/ decreased cough.   ROV 08/05/10 -- severe COPD, exacerbated in the last month with apparent URI. Was treated initially with abx, then with steroid taper (finished mid Jan). Changed from Advair to [facility] for possible contributipon to coughing. Started another pred taper for continued URI signs, yellow sputum, yellow nasal congestion, occas nasal blood. Cough is slightly better.  August 13, 2010 --Returns for persistent symptoms of cough and congestion. Seen several times over last month for bronchitic symptoms with wheezing , cough/congeseiton. Tx w/ steroid taper x2 , amoxicilin. She improves some but never totally resolves. Now over last few days cough is worse with thick green mucus. Sinus drainge andcongestion with low grade fever. CXR 2 weeks ago with no acute changes. Has been using saline rinse that does help. Denies chest pain,  orthopnea, hemoptysis, n/v/d, edema, headache.    Medications Prior to Update: 1)  Dulera 200-5 Mcg/act Aero (Mometasone Furo-Formoterol Fum) .... 2 Puffs First Thing  in Am and 2 Puffs Again in Pm About 12 Hours Later 2)  Multivitamins   Tabs (Multiple Vitamin) .... Take 1 Tablet By Mouth Once A Day 3)  Calcium 600 600 Mg  Tabs (Calcium Carbonate) .... Take 1 Tablet By Mouth Once A Day 4)  Vitamin D3 2000 Unit Caps (Cholecalciferol) .Marland Kitchen.. 1 By Mouth Daily 5)  Proair Hfa 108 (90 Base) Mcg/act Aers (Albuterol Sulfate) .... 2 Puffs Q4h As Needed For Sob 6)  Alprazolam 0.5 Mg Tabs (Alprazolam) .Marland Kitchen.. 1 Every 6 Hrs As Needed 7)  Sumatriptan Succinate 100 Mg Tabs (Sumatriptan Succinate) .Marland Kitchen.. 1 Once Daily As Needed For Migraine 8)  Mucinex 600 Mg Xr12h-Tab  (Guaifenesin) .Marland Kitchen.. 1 Every 12 Hrs As Needed 9)  Prednisone 10 Mg Tabs (Prednisone) .... 4 Tabs For 2 Days, Then 3 Tabs For 2 Days, 2 Tabs For 2 Days, Then 1 Tab For 2 Days, Then Stop  Current Medications (verified): 1)  Dulera 200-5 Mcg/act Aero (Mometasone Furo-Formoterol Fum) .... 2 Puffs First Thing  in Am and 2 Puffs Again in Pm About 12 Hours Later 2)  Multivitamins   Tabs (Multiple Vitamin) .... Take 1 Tablet By Mouth Once A Day 3)  Calcium 600 600 Mg  Tabs (Calcium Carbonate) .... Take 1 Tablet By Mouth Once A Day 4)  Vitamin D3 2000 Unit Caps (Cholecalciferol) .Marland Kitchen.. 1 By Mouth Daily 5)  Proair Hfa 108 (90 Base) Mcg/act Aers (Albuterol Sulfate) .... 2 Puffs Q4h As Needed For Sob 6)  Alprazolam 0.5 Mg Tabs (Alprazolam) .Marland Kitchen.. 1 Every 6 Hrs As Needed 7)  Sumatriptan Succinate 100 Mg Tabs (Sumatriptan Succinate) .Marland Kitchen.. 1 Once Daily As Needed For Migraine 8)  Mucinex 600 Mg Xr12h-Tab (Guaifenesin) .Marland Kitchen.. 1 Every 12 Hrs As Needed 9)  Levaquin 750 Mg Tabs (Levofloxacin) .Marland Kitchen.. 1 By Mouth Once Daily 10)  Spiriva Handihaler 18 Mcg Caps (Tiotropium Bromide Monohydrate) .... Inhale Contents of 1 Capsule Once A Day  Allergies (verified): 1)  ! Erythromycin 2)  ! Ceclor 3)  ! Biaxin 4)  ! Iodine  Past History:  Past Medical History: Last updated: 07/17/2010 COPD     - HFA 75% p coaching July 17, 2010   Family History: Last updated: 12/21/2007 mother died age 63 from cancer father died age 69 from heart attack 2 siblings 1 brother alive age 18  excellent health 1 sister  alive age 36 hx of heart problems and  cancer  Social History: Last updated: 08/01/2010 retired former smoker - quit 2009 married 3 children  Risk Factors: Smoking Status: quit (04/06/2007)  Review of Systems      See HPI  Physical Exam  Additional Exam:  GEN: A/Ox3; pleasant , NAD HEENT:  [place]/AT, , EACs-clear, TMs-wnl, NOSE-clea drainage sinus tenderness , THROAT-clear NECK:  Supple w/ fair ROM; no JVD;  normal carotid impulses w/o bruits; no thyromegaly or nodules palpated; no lymphadenopathy. RESP  Coarse BS w/no wheezing noted. CARD:  RRR, no m/r/g   GI:   Soft & nt; nml bowel sounds; no organomegaly or masses detected. [place]: Warm bil,  no calf tenderness edema, clubbing, pulses intact Neuro:intact w/ no focal deficitis noted.   Impression & Recommendations:  Problem # 1:  COPD (ICD-496)  Slow to resolve exacerbation with possible associated sinusitis Plan:  Levaquin 750mg  once daily for 7 days Mucinex two times a day as needed congestion  Delsym as needed cough  Increase water intake, rest  Tylenol as needed  Saline nasal rinse two  times a day  Please contact office for sooner follow up if symptoms do not improve or worsen  follow up Dr. Delton Coombes in 2 weeks as planned.   Orders: Est. Patient Level IV ([address])  Medications Added to Medication List This Visit: 1)  Levaquin 750 Mg Tabs (Levofloxacin) .Marland Kitchen.. 1 by mouth once daily 2)  Spiriva Handihaler 18 Mcg Caps (Tiotropium bromide monohydrate) .... Inhale contents of 1 capsule once a day  Patient Instructions: 1)  Levaquin 750mg  once daily for 7 days 2)  Mucinex two times a day as needed congestion  3)  Delsym as needed cough  4)  Increase water intake, rest  5)  Tylenol as needed  6)  Saline nasal rinse two times a day  7)  Please contact office for sooner follow up if symptoms do not improve or worsen  8)  follow up Dr. Delton Coombes in 2 weeks as planned.  Prescriptions: LEVAQUIN 750 MG TABS (LEVOFLOXACIN) 1 by mouth once daily  #7 x 0   Entered and Authorized by:   Rubye Oaks NP   Signed by:   Rubye Oaks NP on 08/13/2010   Method used:   [facility] to        [facility]  [phone]* (retail)       [address] [place], [place]  [address]       Ph: [phone] or [phone]       Fax: [phone]   RxID:   [id number]    Orders Added: 1)  Est. Patient Level IV [03474]

## 2010-09-03 NOTE — Assessment & Plan Note (Signed)
Summary: URI, COPD   Visit Type:  Follow-up Primary Provider/Referring Provider:  Ehinger  CC:  f/u 2 wks.-started last pm, temp 100(last pm), sorethroat, cough-yellow, no energy, no sob, and nowheezing.  History of Present Illness: 70 yowf quit smoking 2009  with severe COPD maintained on Advair and Spiriva.  Has been on exertional O2. 2009>> FEV1 of1.35 or 59% of predicted, an FVC of 3.1 or 98% of predicted, and a ratio of 43% consistent with severe airflow limitation.  July 17, 2010 ov acute onset x 8 days drippy nose worse short of breath.  forgetting am advair but consistent with spiriva. hasn't taken proaire yet as per Dr Kavin Leech instructions ? why?  rx by Ehinger with amox > mucus less yelow, still coughing violently and used up a half bottle of hycodan already.>>tx w/ steorid taper , changed to [facility]  August 01, 2010--Returns for an acute office visit. Complains of prod cough with yellow mucus, sore throat x43month - was given amoxicillin by PCP and pred taper by MW.  states she has improved but is not back to baseline. Still cough alot. Prednisone helped alot w/ decreased cough.   ROV 08/05/10 -- severe COPD, exacerbated in the last month with apparent URI. Was treated initially with abx, then with steroid taper (finished mid Jan). Changed from Advair to [facility] for possible contributipon to coughing. Started another pred taper for continued URI signs, yellow sputum, yellow nasal congestion, occas nasal blood. Cough is slightly better.   August 13, 2010 --Returns for persistent symptoms of cough and congestion. Seen several times over last month for bronchitic symptoms with wheezing , cough/congeseiton. Tx w/ steroid taper x2 , amoxicilin. She improves some but never totally resolves. Now over last few days cough is worse with thick green mucus. Sinus drainge and congestion with low grade fever. CXR 2 weeks ago with no acute changes. Has been using saline rinse that does help. Denies  chest pain,  orthopnea, hemoptysis, n/v/d, edema, headache.   08/28/10 -- hx severe COPD, allergies. Has been treated with several rounds of pred and abx for persistent symptoms. The residual symptoms and nasal and sinus congestion, clear drainage, sore throat started last night. Breathing is OK, no wheezing. Changed back to Advair from [facility] a week ago. Has not been requiring ProAir. Not using decongestants. She is using mucinex.   Preventive Screening-Counseling & Management  Alcohol-Tobacco     Smoking Status: quit     Year Quit: 2009  Current Medications (verified): 1)  Multivitamins   Tabs (Multiple Vitamin) .... Take 1 Tablet By Mouth Once A Day 2)  Calcium 600 600 Mg  Tabs (Calcium Carbonate) .... Take 1 Tablet By Mouth Once A Day 3)  Vitamin D3 2000 Unit Caps (Cholecalciferol) .Marland Kitchen.. 1 By Mouth Daily 4)  Proair Hfa 108 (90 Base) Mcg/act Aers (Albuterol Sulfate) .... 2 Puffs Q4h As Needed For Sob 5)  Alprazolam 0.5 Mg Tabs (Alprazolam) .Marland Kitchen.. 1 Every 6 Hrs As Needed 6)  Sumatriptan Succinate 100 Mg Tabs (Sumatriptan Succinate) .Marland Kitchen.. 1 Once Daily As Needed For Migraine 7)  Spiriva Handihaler 18 Mcg Caps (Tiotropium Bromide Monohydrate) .... Inhale Contents of 1 Capsule Once A Day 8)  Advair Diskus 250-50 Mcg/dose Aepb (Fluticasone-Salmeterol) .Marland Kitchen.. 1 Puff Two Times A Day  Allergies (verified): 1)  ! Erythromycin 2)  ! Ceclor 3)  ! Biaxin 4)  ! Iodine  Past History:  Family History: Last updated: 12/29/07 mother died age 48 from cancer father died age  56 from heart attack 2 siblings 1 brother alive age 1  excellent health 1 sister  alive age 66 hx of heart problems and  cancer  Social History: Last updated: 08/01/2010 retired former smoker - quit 2009 married 3 children  Risk Factors: Smoking Status: quit (08/28/2010)  Vital Signs:  Patient profile:   71 year old female Height:      66 inches Weight:      134.25 pounds O2 Sat:      96 % on Room air Temp:     98.4  degrees F oral Pulse rate:   76 / minute BP sitting:   112 / 80  (left arm) Cuff size:   regular  Vitals Entered By: Elray Buba RN (August 28, 2010 10:43 AM)  O2 Flow:  Room air CC: f/u 2 wks.-started last pm,temp 100(last pm),sorethroat,cough-yellow,no energy,no sob,nowheezing Is Patient Diabetic? No Comments Medications reviewed with patient ,phone # verified. Elray Buba RN  August 28, 2010 10:44 AM    Physical Exam  General:  well developed, well nourished, in no acute distress Head:  normocephalic and atraumatic Eyes:  conjunctiva and sclera clear Nose:  congested, clear drainage Mouth:  no exudate, + erythema Neck:  no masses, thyromegaly, or abnormal cervical nodes Lungs:  distant but clear, no wheeze Heart:  regular rate and rhythm, S1, S2 without murmurs, rubs, gallops, or clicks Abdomen:  not examined Msk:  no deformity or scoliosis noted with normal posture Extremities:  trace pretibial edema.  Neurologic:  non-focal.  Skin:  intact without lesions or rashes Psych:  alert and cooperative; normal mood and affect; normal attention span and concentration   Impression & Recommendations:  Problem # 1:  UPPER RESPIRATORY INFECTION (ICD-465.9)  Continue nasal saline washes two times a day  Start using OTC decongestants that contain clorphenerimine or bromphenerimine as directed up to every 4 hours If your symptoms persist w may decide to perform a CT scan of your sinuses Use your Spiriva, Advair as you are doing Follow up with Dr Delton Coombes in 2 -3 weeks.   The following medications were removed from the medication list:    Mucinex 600 Mg Xr12h-tab (Guaifenesin) .Marland Kitchen... 1 every 12 hrs as needed  Orders: Est. Patient Level IV ([address])  Problem # 2:  COPD (ICD-496) No current evidence flare, but high risk for this to occur  Medications Added to Medication List This Visit: 1)  Advair Diskus 250-50 Mcg/dose Aepb (Fluticasone-salmeterol) .Marland Kitchen.. 1 puff two times a  day  Patient Instructions: 1)  Continue nasal saline washes two times a day  2)  Start using OTC decongestants that contain clorphenerimine or bromphenerimine as directed up to every 4 hours 3)  If your symptoms persist w may decide to perform a CT scan of your sinuses 4)  Use your Spiriva, Advair as you are doing 5)  Follow up with Dr Delton Coombes in 2 -3 weeks.

## 2010-09-16 ENCOUNTER — Ambulatory Visit: Payer: Medicare Other | Admitting: Emergency Medicine

## 2010-09-22 ENCOUNTER — Encounter: Payer: Self-pay | Admitting: Emergency Medicine

## 2010-09-22 ENCOUNTER — Ambulatory Visit (INDEPENDENT_AMBULATORY_CARE_PROVIDER_SITE_OTHER): Payer: Medicare Other | Admitting: Emergency Medicine

## 2010-09-22 DIAGNOSIS — J449 Chronic obstructive pulmonary disease, unspecified: Secondary | ICD-10-CM

## 2010-09-30 NOTE — Assessment & Plan Note (Signed)
Summary: COPD   Visit Type:  Follow-up Primary Provider/Referring Provider:  Ehinger  CC:  COPD...pt says her breathing is doing well and has no complaints today.Marland Kitchen  History of Present Illness: 58 yowf quit smoking 2009  with severe COPD maintained on Advair and Spiriva.  Has been on exertional O2. 2009>> FEV1 of1.35 or 59% of predicted, an FVC of 3.1 or 98% of predicted, and a ratio of 43% consistent with severe airflow limitation.  July 17, 2010 ov acute onset x 8 days drippy nose worse short of breath.  forgetting am advair but consistent with spiriva. hasn't taken proaire yet as per Dr Kavin Leech instructions ? why?  rx by Ehinger with amox > mucus less yelow, still coughing violently and used up a half bottle of hycodan already.>>tx w/ steorid taper , changed to [facility]  August 01, 2010--Returns for an acute office visit. Complains of prod cough with yellow mucus, sore throat x24month - was given amoxicillin by PCP and pred taper by MW.  states she has improved but is not back to baseline. Still cough alot. Prednisone helped alot w/ decreased cough.   ROV 08/05/10 -- severe COPD, exacerbated in the last month with apparent URI. Was treated initially with abx, then with steroid taper (finished mid Jan). Changed from Advair to [facility] for possible contributipon to coughing. Started another pred taper for continued URI signs, yellow sputum, yellow nasal congestion, occas nasal blood. Cough is slightly better.   August 13, 2010 --Returns for persistent symptoms of cough and congestion. Seen several times over last month for bronchitic symptoms with wheezing , cough/congeseiton. Tx w/ steroid taper x2 , amoxicilin. She improves some but never totally resolves. Now over last few days cough is worse with thick green mucus. Sinus drainge and congestion with low grade fever. CXR 2 weeks ago with no acute changes. Has been using saline rinse that does help. Denies chest pain,  orthopnea, hemoptysis,  n/v/d, edema, headache.   08/28/10 -- hx severe COPD, allergies. Has been treated with several rounds of pred and abx for persistent symptoms. The residual symptoms and nasal and sinus congestion, clear drainage, sore throat started last night. Breathing is OK, no wheezing. Changed back to Advair from [facility] a week ago. Has not been requiring ProAir. Not using decongestants. She is using mucinex.   09/22/10 -- COPD, allergies, cough. Took about a week before she felt better. Used OTC decongestants. On Advair and Spiriva. Not requiring ProAir.   Preventive Screening-Counseling & Management  Alcohol-Tobacco     Smoking Status: quit  Current Medications (verified): 1)  Multivitamins   Tabs (Multiple Vitamin) .... Take 1 Tablet By Mouth Once A Day 2)  Calcium 600 600 Mg  Tabs (Calcium Carbonate) .... Take 1 Tablet By Mouth Once A Day 3)  Vitamin D3 2000 Unit Caps (Cholecalciferol) .Marland Kitchen.. 1 By Mouth Daily 4)  Proair Hfa 108 (90 Base) Mcg/act Aers (Albuterol Sulfate) .... 2 Puffs Q4h As Needed For Sob 5)  Alprazolam 0.5 Mg Tabs (Alprazolam) .Marland Kitchen.. 1 Every 6 Hrs As Needed 6)  Sumatriptan Succinate 100 Mg Tabs (Sumatriptan Succinate) .Marland Kitchen.. 1 Once Daily As Needed For Migraine 7)  Spiriva Handihaler 18 Mcg Caps (Tiotropium Bromide Monohydrate) .... Inhale Contents of 1 Capsule Once A Day 8)  Advair Diskus 250-50 Mcg/dose Aepb (Fluticasone-Salmeterol) .Marland Kitchen.. 1 Puff Two Times A Day  Allergies (verified): 1)  ! Erythromycin 2)  ! Ceclor 3)  ! Biaxin 4)  ! Iodine  Vital Signs:  Patient  profile:   70 year old female Height:      66 inches (167.64 cm) Weight:      137.13 pounds (62.33 kg) BMI:     22.21 O2 Sat:      96 % on Room air Temp:     98.3 degrees F (36.83 degrees C) oral Pulse rate:   87 / minute BP sitting:   136 / 74  (left arm) Cuff size:   regular  Vitals Entered By: Michel Bickers CMA (September 22, 2010 11:02 AM)  O2 Sat at Rest %:  96 O2 Flow:  Room air CC: COPD...pt says her breathing is  doing well and has no complaints today. Comments Medications reviewed with patient Michel Bickers CMA  September 22, 2010 11:07 AM   Physical Exam  General:  well developed, well nourished, in no acute distress Head:  normocephalic and atraumatic Eyes:  conjunctiva and sclera clear Nose:  no deformity, discharge, inflammation, or lesions Mouth:  no exudate,  Neck:  no masses, thyromegaly, or abnormal cervical nodes Lungs:  distant but clear, no wheeze Heart:  regular rate and rhythm, S1, S2 without murmurs, rubs, gallops, or clicks Abdomen:  not examined Msk:  no deformity or scoliosis noted with normal posture Extremities:  trace pretibial edema.  Neurologic:  non-focal.  Skin:  intact without lesions or rashes Psych:  alert and cooperative; normal mood and affect; normal attention span and concentration   Impression & Recommendations:  Problem # 1:  COPD (ICD-496) Continue your Spiriva and Advair  Use ProAir as needed  Follow up with Dr Delton Coombes in 6 months or as needed   Other Orders: Est. Patient Level III (16109)  Patient Instructions: 1)  Continue your Spiriva and Advair  2)  Use ProAir as needed  3)  Follow up with Dr Delton Coombes in 6 months or as needed

## 2010-11-25 NOTE — Op Note (Signed)
NAMECharmaine, [name]                 ACCOUNT NO.:  [id number]   MEDICAL RECORD NO.:  [id number]          PATIENT TYPE:  AMB   LOCATION:  [facility]                         FACILITY:  [facility]   PHYSICIAN:  Excell Seltzer. Annabell Howells, M.D.    DATE OF BIRTH:  Aug 20, 1940   DATE OF PROCEDURE:  06/26/2008  DATE OF DISCHARGE:                               OPERATIVE REPORT   PROCEDURE:  Cystoscopy, bilateral retrograde pyelograms with  interpretation, multiple bladder biopsies with fulguration.   PREOPERATIVE DIAGNOSIS:  Bladder tumor.   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   SURGEON:  Dr. Bjorn Pippin.   ANESTHESIA:  General.   SPECIMEN:  Biopsies from the bladder neck, left trigone, posterior wall,  right dome and left dome.   DRAINS:  18-French Foley catheter.   COMPLICATIONS:  None.   INDICATIONS:  [name] is a 70 year old white female who originally  presented with frequency and urgency with presumed history of recurrent  urinary tract infection.  She was found to have a negative urine, but  cystoscopy in the office revealed a papillary tumor near the left  trigone and was felt to require TURBT.  The ureteral orifice was not  apparent.  It was felt that retrograde pyelography was indicated.   FINDINGS OF THE PROCEDURE:  The patient was taken to the operating room  where general anesthetic was induced.  She was placed in lithotomy  position.  She was prepped with chlorhexidine solution because of an  iodine allergy.  She was draped in usual sterile fashion.  A time out  was performed.  She had been given Cipro, and PAS hose had been fitted.  A paralytic agent was given for the procedure as well.   Once positioned, cystoscopy was performed using the 22-French scope and  12 and 70 degrees lenses.  Examination revealed a normal urethra.  The  bladder wall had mild trabeculation.  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  There were  additional areas on the posterior wall and dome that were of concern.   The retrograde pyelograms were performed using a 5-French open-end  catheter and Omnipaque.   The left retrograde pyelogram revealed an unremarkable ureter and  intrarenal collecting system.   The right retrograde pyelogram revealed an unremarkable ureter and  intrarenal collecting system.   After completion of retrograde pyelogram, a cup biopsy forceps was used  to take biopsies from the left trigone, the posterior wall, the left and  right dome, and the bladder neck.  Attempt was made to take biopsies  adjacent to the left ureteral orifice.  However, I was unable to get  sufficient tissue.   Once the biopsies had been obtained, the biopsy sites were fulgurated  with a Bugbee electrode.   Once hemostasis achieved, an 18-French Foley  catheter was inserted.  The  balloon was filled with 10 mL of sterile fluid.  The catheter was placed  to straight drainage.  The patient's anesthetic was reversed.  She was  taken to recovery room in stable condition.  There were no  complications.      Excell Seltzer. Annabell Howells, M.D.  Electronically Signed     JJW/MEDQ  D:  06/26/2008  T:  06/26/2008  Job:  [id number]

## 2010-11-25 NOTE — Assessment & Plan Note (Signed)
[facility]                             PULMONARY OFFICE NOTE   Ariel Gonzalez                        MRN:          [id number]  DATE:02/28/2007                            DOB:          Feb 09, 1941    SUBJECTIVE:  Ariel Gonzalez is a 70 year old woman who has been followed by  Dr. Gailen Shelter for COPD.  She quit smoking approximately 2 years  ago and has been maintained on Advair 250/50 one inhalation b.i.d. and  Spiriva once daily.  She also uses DuoNeb on a p.r.n. basis.  She tells  me that she has been doing fairly well with decent exertional tolerance.  She is able to do her usual activities.  Over the last 2 days, she feels  that she has begun to develop some upper respiratory type symptoms with  some nasal congestion and nasal drainage.  This morning she began to  have some cough productive of greenish phlegm and some associated chest  tightness.  She has also had some low grade fevers.   CURRENT MEDICATIONS:  1. Advair 250/50, one inhalation b.i.d.  2. Spiriva one inhalation daily.  3. Multivitamin once daily.  4. Calcium 600 mg daily.  5. DuoNeb one inhaled nebulizer p.r.n.   PHYSICAL EXAMINATION:  GENERAL:  This is a well-appearing thin woman who  is in no distress on room air.  VITAL SIGNS:  Her weight is 126 pounds, temperature is 98.8, blood  pressure 120/80, heart rate 91, SpO2 92% on room air.  HEENT:  Benign.  NECK:  Has no lymphadenopathy or stridor.  LUNGS:  Distant but clear.  She has no wheezing in the normal  respiratory cycle or on a forced expiration.  HEART:  Regular without murmur.  ABDOMEN:  Benign.  EXTREMITIES:  No cyanosis, clubbing, or edema.   IMPRESSION:  Chronic obstructive pulmonary disease with a possible early  bronchitic flare in the setting of upper respiratory infection.   PLAN:  1. I will continue her Spiriva and Advair as her maintenance regimen.  2. I will give her a prescription for doxycycline  100 mg b.i.d. for 7      days.  She will fill this prescription if she continues to have      purulent sputum over the next couple of days.  I will not use any      prednisone at this time given the fact that she has no wheezing on      exam.  3. I will repeat her pulmonary function tests at her next visit after      this exacerbation has resolved.  4. I will add albuterol to be used on an as needed basis and      discontinue her DuoNeb.  5. I will see her back in 1 month to review the results of her      pulmonary function testing or sooner if she has any difficulty in      the interim.     Ariel Peer, MD  Electronically Signed    RSB/MedQ  DD: 02/28/2007  DT: 02/28/2007  Job #: [id number]   cc:   Bryan Lemma. Manus Gunning, M.D.

## 2010-11-25 NOTE — Assessment & Plan Note (Signed)
[facility]                             PULMONARY OFFICE NOTE   DAO, MEARNS                        MRN:          [id number]  DATE:04/06/2007                            DOB:          April 14, 1941    SUBJECTIVE:  This is a follow up visit for Ariel Gonzalez who is a 70-year-  old woman with severe chronic obstructive pulmonary disease. She was  followed by Dr. Danice Goltz and then seen by me last month. We  treated with doxycycline for an acute bronchitis which resolved with  this therapy. She now states that her breathing is doing quite well. She  is able to do her usual activities. She is not using supplemental  albuterol. She had pulmonary function testing performed today to compare  to her prior studies and she is here to review the results of these.   CURRENT MEDICATIONS:  1. Advair 250/50 1 inhalation b.i.d.  2. Spiriva 1 inhalation daily.  3. Multivitamin once daily.  4. Calcium 600 mg daily.  5. DuoNebs every 6 hours p.r.n. shortness of breath.   PHYSICAL EXAMINATION:  GENERAL:  This is a pleasant well appearing. She  is in no distress on room air.  VITAL SIGNS:  Temperature 98.4, blood pressure 136/74, heart rate 90,  SPO2 98% on room air.  HEENT:  Benign.  NECK:  Supple without lymphadenopathy or strider.  LUNGS:  Distant, but clear if she does not wheeze on a forced  expiration.  HEART:  Regular without murmur.  ABDOMEN:  Soft and nontender, nondistended, with positive bowel sounds.  EXTREMITIES:  No cyanosis, clubbing, or edema.   STUDIES:  Pulmonary function testing performed today showed an FEV1 of  1.35 or 59% of predicted, an FVC of 3.1 or 98% of predicted, and a ratio  of 43% consistent with severe airflow limitation. There was improvement  compared with her last study which was done in 2007 and these numbers  were grossly stable when compared with her initial PFTs from 2005. Her  lung volume showed mild hyperinflation.  Her diffusion capacity was  normal.   IMPRESSION:  Severe chronic obstructive pulmonary disease, currently  clinically stable.   PLAN:  1. Continue Spiriva 1 inhalation daily and Advair 250/50 1 inhalation      b.i.d. with supplemental albuterol as needed. She is not using this      frequently.  2. She will obtain her flu shot from Dr. Manus Gunning this fall. She tells      me that she has also been receiving her Pneumovax from Dr. Manus Gunning      and is not due for that inoculation for another few years.  3. I will follow up with Ariel Gonzalez in six months or sooner if she      has any difficulty in the interim.     Leslye Peer, MD  Electronically Signed    RSB/MedQ  DD: 04/06/2007  DT: 04/07/2007  Job #: [id number]   cc:   Bryan Lemma. Manus Gunning, M.D.

## 2010-11-28 NOTE — Assessment & Plan Note (Signed)
[facility]                             PULMONARY OFFICE NOTE   Ariel Gonzalez, Ariel Gonzalez                        MRN:          [id number]  DATE:06/14/2006                            DOB:          11/23/1940    This very pleasant 70 year old white female who I follow here for  chronic obstructive pulmonary disease.  She does have an asthmatic  bronchitic component.  She is currently maintained on Advair and  Spiriva.  The patient presents today stating that she is doing very  well.  She has finally discontinued smoking.  She denies any dyspnea on  exertion over her usual.  She actually has been increased in stamina and  her activities remain full during the day.   The patient denies any fevers, chills or sweats.   CURRENT MEDICATIONS:  Are as noted on the intake sheet.  These have been  reviewed and are accurate.   PHYSICAL EXAMINATION:  VITAL SIGNS:  Noted.  Oxygen saturation was 95%  on room air.  GENERAL:  This is a very well-developed, somewhat anxious female who is  in no acute distress.  HEENT:  Unremarkable.  NECK:  Supple.  No adenopathy noted, no JVD.  LUNGS:  Clear to auscultation bilaterally.  She is moving air very well.  CARDIAC:  Regular rate and rhythm.  No rubs, murmurs, or gallops.  EXTREMITIES:  The patient has no cyanosis, no clubbing, no edema noted.   IMPRESSION:  1. Chronic obstructive pulmonary disease with asthmatic bronchitic      component.  The patient is currently well compensated on her      current regimen.  The plan will be for the patient to continue      Advair and Spiriva as they currently are.  2. Note is made that the patient is up-to-date on her flu vaccine.  3. Follow up will be in 6 months' time.  She is to contact us prior to      that time should any problems arise.     Gailen Shelter, MD  Electronically Signed    CLG/MedQ  DD: 06/14/2006  DT: 06/14/2006  Job #: [id number]

## 2010-11-28 NOTE — Assessment & Plan Note (Signed)
[facility]                             PULMONARY OFFICE NOTE   Ariel Gonzalez, Ariel Gonzalez                        MRN:          [id number]  DATE:11/11/2006                            DOB:          Mar 30, 1941    This is a very pleasant 70 year old white female who follows here for  chronic obstructive pulmonary disease.  She does have an asthmatic  bronchitic component.  She is currently maintained on Advair and  Spiriva.  She follows here approximately every six months.  She has been  abstinent from cigarettes for over six months now.  She was commended on  this.  She denies any complaints.  She is actually doing fairly well.   CURRENT MEDICATIONS:  As noted on the intake sheet.  These have been  reviewed and are accurate.   PHYSICAL EXAMINATION:  VITAL SIGNS:  Oxygen saturation is 98% on room  air.  GENERAL:  This is a well-developed thin female who is in no acute  distress.  HEENT:  Unremarkable.  NECK:  Supple.  No adenopathy noted, no JVD.  LUNGS:  Clear to auscultation bilaterally though distant sounding.  No  wheezes noted today.  CARDIAC:  Regular rate and rhythm.  No rubs, murmurs or gallops heard.  ABDOMEN:  Benign.  EXTREMITIES:  Patient has no cyanosis, no clubbing, no edema noted.   IMPRESSION:  Chronic obstructive pulmonary disease with asthmatic  bronchitic component.  Patient is very well-compensated.   PLAN:  Therefore to continue Advair 250/50 one inhalation twice a day  and Spiriva one capsule inhaled daily.  She is to follow up in  approximately three months time with Dr. Levy Pupa.  At that time we  will order repeat PFTs as her last PFTs were done in July 2007.   Follow up prior to that on a p.r.n. basis.  The patient was notified  that I will be leaving the practice and that Dr. Delton Coombes will assume her  care, and she was satisfied with this.     Gailen Shelter, MD  Electronically Signed    CLG/MedQ  DD: 11/11/2006   DT: 11/11/2006  Job #: [id number]

## 2010-12-19 ENCOUNTER — Other Ambulatory Visit: Payer: Self-pay | Admitting: Family Medicine

## 2010-12-19 DIAGNOSIS — Z1231 Encounter for screening mammogram for malignant neoplasm of breast: Secondary | ICD-10-CM

## 2011-01-19 ENCOUNTER — Ambulatory Visit
Admission: RE | Admit: 2011-01-19 | Discharge: 2011-01-19 | Disposition: A | Discharge status: Home / Self Care | Payer: Medicare Other | Source: Ambulatory Visit | Attending: Family Medicine | Admitting: Family Medicine

## 2011-01-19 DIAGNOSIS — Z1231 Encounter for screening mammogram for malignant neoplasm of breast: Secondary | ICD-10-CM

## 2011-03-09 ENCOUNTER — Other Ambulatory Visit: Payer: Self-pay | Admitting: Family Medicine

## 2011-03-25 ENCOUNTER — Encounter: Payer: Self-pay | Admitting: Pulmonary Disease

## 2011-03-26 ENCOUNTER — Encounter: Payer: Self-pay | Admitting: Emergency Medicine

## 2011-03-26 ENCOUNTER — Ambulatory Visit (INDEPENDENT_AMBULATORY_CARE_PROVIDER_SITE_OTHER): Payer: Medicare Other | Admitting: Emergency Medicine

## 2011-03-26 VITALS — BP 130/82 | HR 84 | Temp 98.0°F | Ht 66.0 in | Wt 135.0 lb

## 2011-03-26 DIAGNOSIS — J449 Chronic obstructive pulmonary disease, unspecified: Secondary | ICD-10-CM

## 2011-03-26 DIAGNOSIS — J4489 Other specified chronic obstructive pulmonary disease: Secondary | ICD-10-CM

## 2011-03-26 NOTE — Patient Instructions (Signed)
Continue your Spiriva and Advair Start doing nasal saline washes daily for the next 2 weeks.  Start taking loratadine 10mg  daily for the next 2 weeks. Follow with Dr Delton Coombes in 6 months or sooner if you have any problems.

## 2011-03-26 NOTE — Assessment & Plan Note (Signed)
Clinically stable. Has started to have paroxysms of cough, ? Related to allergies.  - trial NSW every day - trial loratadine x 2 weeks to see if it helps.  - continue same BD regimen.

## 2011-03-26 NOTE — Progress Notes (Signed)
History of Present Illness:  68 yowf quit smoking 2009 with severe COPD maintained on Advair and Spiriva. Has been on exertional O2. 2009>> FEV1 of1.35 or 59% of predicted, an FVC of 3.1 or 98% of predicted, and a ratio of 43% consistent with severe airflow limitation.   August 01, 2010--Returns for an acute office visit. Complains of prod cough with yellow mucus, sore throat x50month - was given amoxicillin by PCP and pred taper by MW. states she has improved but is not back to baseline. Still cough alot. Prednisone helped alot w/ decreased cough.  ROV 08/05/10 -- severe COPD, exacerbated in the last month with apparent URI. Was treated initially with abx, then with steroid taper (finished mid Jan). Changed from Advair to [facility] for possible contributipon to coughing. Started another pred taper for continued URI signs, yellow sputum, yellow nasal congestion, occas nasal blood. Cough is slightly better.   August 13, 2010 --Returns for persistent symptoms of cough and congestion. Seen several times over last month for bronchitic symptoms with wheezing , cough/congeseiton. Tx w/ steroid taper x2 , amoxicilin. She improves some but never totally resolves. Now over last few days cough is worse with thick green mucus. Sinus drainge and congestion with low grade fever. CXR 2 weeks ago with no acute changes. Has been using saline rinse that does help. Denies chest pain, orthopnea, hemoptysis, n/v/d, edema, headache.   08/28/10 -- hx severe COPD, allergies. Has been treated with several rounds of pred and abx for persistent symptoms. The residual symptoms and nasal and sinus congestion, clear drainage, sore throat started last night. Breathing is OK, no wheezing. Changed back to Advair from [facility] a week ago. Has not been requiring ProAir. Not using decongestants. She is using mucinex.   09/22/10 -- COPD, allergies, cough. Took about a week before she felt better. Used OTC decongestants. On Advair and Spiriva. Not  requiring ProAir.   ROV 03/26/11 -- severe COPD, allergic rhinitis, cough. Returns for regular follow up. She had done well since March until the last few weeks. She may have had some increased nasal drainage, congestion. She is coughing up clear mucous. Feels well, no fever, no purulent sputum. Taking Advair and Spiriva, never needs SABA. No flares since last visit.   Gen: Pleasant, well-nourished, in no distress,  normal affect  ENT: No lesions,  mouth clear,  oropharynx clear, no postnasal drip  Neck: No JVD, no TMG, no carotid bruits  Lungs: No use of accessory muscles, no dullness to percussion, clear without rales or rhonchi  Cardiovascular: RRR, heart sounds normal, no murmur or gallops, no peripheral edema  Musculoskeletal: No deformities, no cyanosis or clubbing  Neuro: alert, non focal  Skin: Warm, no lesions or rashes  COPD Clinically stable. Has started to have paroxysms of cough, ? Related to allergies.  - trial NSW every day - trial loratadine x 2 weeks to see if it helps.  - continue same BD regimen.

## 2011-04-17 LAB — POCT HEMOGLOBIN-HEMACUE: Hemoglobin: 12.1 g/dL (ref 12.0–15.0)

## 2011-05-31 ENCOUNTER — Other Ambulatory Visit: Payer: Self-pay | Admitting: Emergency Medicine

## 2011-09-29 ENCOUNTER — Encounter: Payer: Self-pay | Admitting: Emergency Medicine

## 2011-09-29 ENCOUNTER — Ambulatory Visit (INDEPENDENT_AMBULATORY_CARE_PROVIDER_SITE_OTHER): Payer: Medicare Other | Admitting: Emergency Medicine

## 2011-09-29 VITALS — BP 118/76 | HR 77 | Temp 97.6°F | Ht 66.0 in | Wt 136.4 lb

## 2011-09-29 DIAGNOSIS — J4489 Other specified chronic obstructive pulmonary disease: Secondary | ICD-10-CM

## 2011-09-29 DIAGNOSIS — J449 Chronic obstructive pulmonary disease, unspecified: Secondary | ICD-10-CM

## 2011-09-29 NOTE — Patient Instructions (Signed)
Please continue your current medications as you are taking them Use your nasal saline and loratadine when you need them Follow with Dr Delton Coombes in 6 months or sooner if you have any problems

## 2011-09-29 NOTE — Assessment & Plan Note (Signed)
Doing well -  - continue current BD's - loratadine and NSW when she needs it.  - rov 6 months.

## 2011-09-29 NOTE — Progress Notes (Signed)
History of Present Illness:  17 yowf quit smoking 2009 with severe COPD maintained on Advair and Spiriva. Has been on exertional O2. 2009>> FEV1 of1.35 or 59% of predicted, an FVC of 3.1 or 98% of predicted, and a ratio of 43% consistent with severe airflow limitation.   August 13, 2010 --Returns for persistent symptoms of cough and congestion. Seen several times over last month for bronchitic symptoms with wheezing , cough/congeseiton. Tx w/ steroid taper x2 , amoxicilin. She improves some but never totally resolves. Now over last few days cough is worse with thick green mucus. Sinus drainge and congestion with low grade fever. CXR 2 weeks ago with no acute changes. Has been using saline rinse that does help. Denies chest pain, orthopnea, hemoptysis, n/v/d, edema, headache.   08/28/10 -- hx severe COPD, allergies. Has been treated with several rounds of pred and abx for persistent symptoms. The residual symptoms and nasal and sinus congestion, clear drainage, sore throat started last night. Breathing is OK, no wheezing. Changed back to Advair from [facility] a week ago. Has not been requiring ProAir. Not using decongestants. She is using mucinex.   09/22/10 -- COPD, allergies, cough. Took about a week before she felt better. Used OTC decongestants. On Advair and Spiriva. Not requiring ProAir.   ROV 03/26/11 -- severe COPD, allergic rhinitis, cough. Returns for regular follow up. She had done well since March until the last few weeks. She may have had some increased nasal drainage, congestion. She is coughing up clear mucous. Feels well, no fever, no purulent sputum. Taking Advair and Spiriva, never needs SABA. No flares since last visit.   ROV 09/29/11 -- severe  COPD, allergic rhinitis, cough. Returns for regular follow up. Has done well since last visit, no exacerbations.  Has been using NSW and loratadine prn, helps with drainage and allows her to avoid progression. Remains on Advair + Spiriva. Good  exertional tolerance.    Filed Vitals:   09/29/11 1039  BP: 118/76  Pulse: 77  Temp: 97.6 F (36.4 C)   Gen: Pleasant, well-nourished, in no distress,  normal affect  ENT: No lesions,  mouth clear,  oropharynx clear, no postnasal drip  Neck: No JVD, no TMG, no carotid bruits  Lungs: No use of accessory muscles, no dullness to percussion, clear without rales or rhonchi  Cardiovascular: RRR, heart sounds normal, no murmur or gallops, no peripheral edema  Musculoskeletal: No deformities, no cyanosis or clubbing  Neuro: alert, non focal  Skin: Warm, no lesions or rashes  COPD Doing well -  - continue current BD's - loratadine and NSW when she needs it.  - rov 6 months.

## 2011-10-23 ENCOUNTER — Other Ambulatory Visit: Payer: Self-pay | Admitting: Emergency Medicine

## 2011-10-26 ENCOUNTER — Other Ambulatory Visit: Payer: Self-pay | Admitting: Emergency Medicine

## 2011-12-22 ENCOUNTER — Other Ambulatory Visit: Payer: Self-pay | Admitting: Family Medicine

## 2011-12-22 DIAGNOSIS — Z1231 Encounter for screening mammogram for malignant neoplasm of breast: Secondary | ICD-10-CM

## 2012-01-21 ENCOUNTER — Ambulatory Visit
Admission: RE | Admit: 2012-01-21 | Discharge: 2012-01-21 | Disposition: A | Discharge status: Home / Self Care | Payer: Medicare Other | Source: Ambulatory Visit | Attending: Family Medicine | Admitting: Family Medicine

## 2012-01-21 DIAGNOSIS — Z1231 Encounter for screening mammogram for malignant neoplasm of breast: Secondary | ICD-10-CM | POA: Diagnosis not present

## 2012-04-01 ENCOUNTER — Ambulatory Visit (INDEPENDENT_AMBULATORY_CARE_PROVIDER_SITE_OTHER): Payer: Medicare Other | Admitting: Emergency Medicine

## 2012-04-01 ENCOUNTER — Encounter: Payer: Self-pay | Admitting: Emergency Medicine

## 2012-04-01 VITALS — BP 140/80 | HR 82 | Temp 98.6°F | Ht 66.5 in | Wt 132.0 lb

## 2012-04-01 DIAGNOSIS — J069 Acute upper respiratory infection, unspecified: Secondary | ICD-10-CM | POA: Diagnosis not present

## 2012-04-01 DIAGNOSIS — J449 Chronic obstructive pulmonary disease, unspecified: Secondary | ICD-10-CM | POA: Diagnosis not present

## 2012-04-01 NOTE — Assessment & Plan Note (Signed)
URI vs allergy flare. She is high risk for sinusitis or COPD exacerbation.  - reviewed sx of bacterial infxn or COPD flare with her. She will call me if needed - NSW - nasal steroid - loratadine

## 2012-04-01 NOTE — Progress Notes (Signed)
History of Present Illness:  53 yowf quit smoking 2009 with severe COPD maintained on Advair and Spiriva. Has been on exertional O2. 2009>> FEV1 of1.35 or 59% of predicted, an FVC of 3.1 or 98% of predicted, and a ratio of 43% consistent with severe airflow limitation.   August 13, 2010 --Returns for persistent symptoms of cough and congestion. Seen several times over last month for bronchitic symptoms with wheezing , cough/congeseiton. Tx w/ steroid taper x2 , amoxicilin. She improves some but never totally resolves. Now over last few days cough is worse with thick green mucus. Sinus drainge and congestion with low grade fever. CXR 2 weeks ago with no acute changes. Has been using saline rinse that does help. Denies chest pain, orthopnea, hemoptysis, n/v/d, edema, headache.   08/28/10 -- hx severe COPD, allergies. Has been treated with several rounds of pred and abx for persistent symptoms. The residual symptoms and nasal and sinus congestion, clear drainage, sore throat started last night. Breathing is OK, no wheezing. Changed back to Advair from [facility] a week ago. Has not been requiring ProAir. Not using decongestants. She is using mucinex.   09/22/10 -- COPD, allergies, cough. Took about a week before she felt better. Used OTC decongestants. On Advair and Spiriva. Not requiring ProAir.   ROV 03/26/11 -- severe COPD, allergic rhinitis, cough. Returns for regular follow up. She had done well since March until the last few weeks. She may have had some increased nasal drainage, congestion. She is coughing up clear mucous. Feels well, no fever, no purulent sputum. Taking Advair and Spiriva, never needs SABA. No flares since last visit.   ROV 09/29/11 -- severe  COPD, allergic rhinitis, cough. Returns for regular follow up. Has done well since last visit, no exacerbations.  Has been using NSW and loratadine prn, helps with drainage and allows her to avoid progression. Remains on Advair + Spiriva. Good  exertional tolerance.   ROV 04/01/12 -- severe  COPD, allergic rhinitis, cough. She started to have more PND about 2 days ago, has led to sore throat, cough. She has been doing NSW bid. She hasn't taken loratadine regularly, did start nasal decongestant OTC. No other flares since last visit.    Filed Vitals:   04/01/12 1628  BP: 140/80  Pulse: 82  Temp: 98.6 F (37 C)   Gen: Pleasant, well-nourished, in no distress,  normal affect  ENT: No lesions,  mouth clear,  oropharynx clear, no postnasal drip  Neck: No JVD, no TMG, no carotid bruits  Lungs: No use of accessory muscles, no dullness to percussion, clear without rales or rhonchi  Cardiovascular: RRR, heart sounds normal, no murmur or gallops, no peripheral edema  Musculoskeletal: No deformities, no cyanosis or clubbing  Neuro: alert, non focal  Skin: Warm, no lesions or rashes  UPPER RESPIRATORY INFECTION URI vs allergy flare. She is high risk for sinusitis or COPD exacerbation.  - reviewed sx of bacterial infxn or COPD flare with her. She will call me if needed - NSW - nasal steroid - loratadine   COPD - continue same regimen.

## 2012-04-01 NOTE — Assessment & Plan Note (Signed)
-   continue same regimen 

## 2012-04-01 NOTE — Patient Instructions (Addendum)
Please continue your inhaled medications as you are taking them Continue your nasal saline washes 1 to 2 times a day Start taking loratadine (Claritin) 10mg  daily Start using nasonex 2 sprays each side daily Use decongestants that contain chlorpheniramine or brompheniramine as directed Call our office if you develop fever, colored nasal drainage, wheezing, shortness of breath Follow with Dr Delton Coombes in 6 months or sooner if you have any problems

## 2012-04-11 ENCOUNTER — Ambulatory Visit: Payer: Medicare Other | Admitting: Emergency Medicine

## 2012-04-17 ENCOUNTER — Other Ambulatory Visit: Payer: Self-pay | Admitting: Emergency Medicine

## 2012-04-28 DIAGNOSIS — Z23 Encounter for immunization: Secondary | ICD-10-CM | POA: Diagnosis not present

## 2012-06-25 ENCOUNTER — Other Ambulatory Visit: Payer: Self-pay | Admitting: Emergency Medicine

## 2012-06-28 DIAGNOSIS — J449 Chronic obstructive pulmonary disease, unspecified: Secondary | ICD-10-CM | POA: Diagnosis not present

## 2012-06-28 DIAGNOSIS — Z1331 Encounter for screening for depression: Secondary | ICD-10-CM | POA: Diagnosis not present

## 2012-06-28 DIAGNOSIS — M899 Disorder of bone, unspecified: Secondary | ICD-10-CM | POA: Diagnosis not present

## 2012-06-28 DIAGNOSIS — Z1211 Encounter for screening for malignant neoplasm of colon: Secondary | ICD-10-CM | POA: Diagnosis not present

## 2012-06-28 DIAGNOSIS — Z0000 Encounter for general adult medical examination without abnormal findings: Secondary | ICD-10-CM | POA: Diagnosis not present

## 2012-06-28 DIAGNOSIS — E7800 Pure hypercholesterolemia, unspecified: Secondary | ICD-10-CM | POA: Diagnosis not present

## 2012-06-28 DIAGNOSIS — M519 Unspecified thoracic, thoracolumbar and lumbosacral intervertebral disc disorder: Secondary | ICD-10-CM | POA: Diagnosis not present

## 2012-06-28 DIAGNOSIS — M949 Disorder of cartilage, unspecified: Secondary | ICD-10-CM | POA: Diagnosis not present

## 2012-06-28 DIAGNOSIS — E559 Vitamin D deficiency, unspecified: Secondary | ICD-10-CM | POA: Diagnosis not present

## 2012-07-29 ENCOUNTER — Other Ambulatory Visit: Payer: Self-pay | Admitting: Emergency Medicine

## 2012-08-08 DIAGNOSIS — Z1211 Encounter for screening for malignant neoplasm of colon: Secondary | ICD-10-CM | POA: Diagnosis not present

## 2012-09-14 DIAGNOSIS — R350 Frequency of micturition: Secondary | ICD-10-CM | POA: Diagnosis not present

## 2012-09-14 DIAGNOSIS — G43009 Migraine without aura, not intractable, without status migrainosus: Secondary | ICD-10-CM | POA: Diagnosis not present

## 2012-10-05 ENCOUNTER — Encounter: Payer: Self-pay | Admitting: Emergency Medicine

## 2012-10-05 ENCOUNTER — Ambulatory Visit (INDEPENDENT_AMBULATORY_CARE_PROVIDER_SITE_OTHER): Payer: Medicare Other | Admitting: Emergency Medicine

## 2012-10-05 VITALS — BP 128/80 | HR 104 | Temp 97.0°F | Ht 66.5 in | Wt 132.0 lb

## 2012-10-05 DIAGNOSIS — J449 Chronic obstructive pulmonary disease, unspecified: Secondary | ICD-10-CM

## 2012-10-05 DIAGNOSIS — J309 Allergic rhinitis, unspecified: Secondary | ICD-10-CM

## 2012-10-05 NOTE — Patient Instructions (Addendum)
Please continue your Spiriva and Advair Use albuterol as needed Spirometry today Follow with Dr Delton Coombes in 6 months or sooner if you have any problems

## 2012-10-05 NOTE — Assessment & Plan Note (Signed)
-   start loratadine and NSW in the Spring and Fall

## 2012-10-05 NOTE — Progress Notes (Signed)
History of Present Illness:  61 yowf quit smoking 2009 with severe COPD maintained on Advair and Spiriva. Has been on exertional O2. 2009>> FEV1 of 1.35 or 59% of predicted, an FVC of 3.1 or 98% of predicted, and a ratio of 43% consistent with severe airflow limitation.   ROV 10/05/12 -- f/u visit for severe COPD, allergic rhinitis, cough. Her regimen is Advair + Spiriva. She has done well for months, but tells me that she has a gastroenteritis over the last weekend. Now feels better, never developed resp sx. No flares since last visit. She uses SABA very rarely. She isn't doing [place] or loratadine right now, but plans to start in the Spring. She is active, does housework. Would have to stop to rest with several flights stairs. Rare wheeze. Rare cough.    Filed Vitals:   10/05/12 0921  BP: 128/80  Pulse: 104  Temp: 97 F (36.1 C)   Gen: Pleasant, well-nourished, in no distress,  normal affect  ENT: No lesions,  mouth clear,  oropharynx clear, no postnasal drip  Neck: No JVD, no TMG, no carotid bruits  Lungs: No use of accessory muscles, no dullness to percussion, clear without rales or rhonchi  Cardiovascular: RRR, heart sounds normal, no murmur or gallops, no peripheral edema  Musculoskeletal: No deformities, no cyanosis or clubbing  Neuro: alert, non focal  Skin: Warm, no lesions or rashes  COPD - continue Advair + Spiriva - SABA prn - spirometry today - rov 6 months  Allergic rhinitis - start loratadine and NSW in the Spring and Fall

## 2012-10-05 NOTE — Assessment & Plan Note (Signed)
-   continue Advair + Spiriva - SABA prn - spirometry today - rov 6 months

## 2012-10-08 ENCOUNTER — Other Ambulatory Visit: Payer: Self-pay | Admitting: Emergency Medicine

## 2012-10-19 ENCOUNTER — Telehealth: Payer: Self-pay | Admitting: Emergency Medicine

## 2012-10-19 MED ORDER — TIOTROPIUM BROMIDE MONOHYDRATE 18 MCG IN CAPS: ORAL_CAPSULE | RESPIRATORY_TRACT | Status: DC

## 2012-10-19 NOTE — Telephone Encounter (Signed)
I spoke with pt. She is requesting new rx for spiriva to be sent to [facility]. i advised pt will call in RX. She voiced her understanding and needed nothing further.

## 2012-11-04 ENCOUNTER — Other Ambulatory Visit: Payer: Self-pay | Admitting: Family Medicine

## 2012-11-04 DIAGNOSIS — L821 Other seborrheic keratosis: Secondary | ICD-10-CM | POA: Diagnosis not present

## 2012-11-04 DIAGNOSIS — L820 Inflamed seborrheic keratosis: Secondary | ICD-10-CM | POA: Diagnosis not present

## 2012-12-12 ENCOUNTER — Other Ambulatory Visit: Payer: Self-pay

## 2012-12-12 DIAGNOSIS — Z1231 Encounter for screening mammogram for malignant neoplasm of breast: Secondary | ICD-10-CM

## 2013-01-23 ENCOUNTER — Ambulatory Visit
Admission: RE | Admit: 2013-01-23 | Discharge: 2013-01-23 | Disposition: A | Discharge status: Home / Self Care | Payer: Medicare Other | Source: Ambulatory Visit

## 2013-01-23 DIAGNOSIS — Z1231 Encounter for screening mammogram for malignant neoplasm of breast: Secondary | ICD-10-CM | POA: Diagnosis not present

## 2013-04-04 ENCOUNTER — Encounter: Payer: Self-pay | Admitting: Emergency Medicine

## 2013-04-04 ENCOUNTER — Ambulatory Visit (INDEPENDENT_AMBULATORY_CARE_PROVIDER_SITE_OTHER): Payer: Medicare Other | Admitting: Emergency Medicine

## 2013-04-04 VITALS — BP 130/80 | HR 87 | Temp 97.0°F | Ht 66.5 in | Wt 134.0 lb

## 2013-04-04 DIAGNOSIS — J449 Chronic obstructive pulmonary disease, unspecified: Secondary | ICD-10-CM | POA: Diagnosis not present

## 2013-04-04 DIAGNOSIS — J309 Allergic rhinitis, unspecified: Secondary | ICD-10-CM

## 2013-04-04 NOTE — Assessment & Plan Note (Signed)
-   continue advair and spiriva - albuterol prn - flu shot - rov 6 months

## 2013-04-04 NOTE — Progress Notes (Signed)
History of Present Illness:  43 yowf quit smoking 2009 with severe COPD maintained on Advair and Spiriva. Has been on exertional O2. 2009>> FEV1 of 1.35 or 59% of predicted, an FVC of 3.1 or 98% of predicted, and a ratio of 43% consistent with severe airflow limitation.   ROV 10/05/12 -- f/u visit for severe COPD, allergic rhinitis, cough. Her regimen is Advair + Spiriva. She has done well for months, but tells me that she has a gastroenteritis over the last weekend. Now feels better, never developed resp sx. No flares since last visit. She uses SABA very rarely. She isn't doing [place] or loratadine right now, but plans to start in the Spring. She is active, does housework. Would have to stop to rest with several flights stairs. Rare wheeze. Rare cough.   ROV 04/04/13 -- severe COPD, allergic rhinitis, cough. She has been doing well physically but has been dealing w her husband having alzheimer's. She had some allergies this Spring, took loratadine and nasal washes prn. She remains on Advair on Spiriva. She uses albuterol very rarely. No exacerbations. Her activity level may be a little less, but it isn't breathing that is limiting her.   Filed Vitals:   04/04/13 1039  BP: 130/80  Pulse: 87  Temp: 97 F (36.1 C)  TempSrc: Oral  Height: 5' 6.5" (1.689 m)  Weight: 134 lb (60.782 kg)  SpO2: 95%   Gen: Pleasant, well-nourished, in no distress,  normal affect  ENT: No lesions,  mouth clear,  oropharynx clear, no postnasal drip  Neck: No JVD, no TMG, no carotid bruits  Lungs: No use of accessory muscles, no dullness to percussion, clear without rales or rhonchi  Cardiovascular: RRR, heart sounds normal, no murmur or gallops, no peripheral edema  Musculoskeletal: No deformities, no cyanosis or clubbing  Neuro: alert, non focal  Skin: Warm, no lesions or rashes  COPD - continue advair and spiriva - albuterol prn - flu shot - rov 6 months  Allergic rhinitis - restart nasal saline and  loratadine prn for allergy flares.

## 2013-04-04 NOTE — Assessment & Plan Note (Signed)
-   restart nasal saline and loratadine prn for allergy flares.

## 2013-04-04 NOTE — Patient Instructions (Addendum)
Please continue your Spiriva and Advair Use albuterol as needed Get your Flu Shot with Dr Manus Gunning as planned Restart your nasal saline washes and your loratadine if allergies start to flare Follow with Dr Delton Coombes in 6 months or sooner if you have any problems

## 2013-04-18 ENCOUNTER — Ambulatory Visit (INDEPENDENT_AMBULATORY_CARE_PROVIDER_SITE_OTHER): Payer: Medicare Other

## 2013-04-18 DIAGNOSIS — Z23 Encounter for immunization: Secondary | ICD-10-CM

## 2013-05-09 ENCOUNTER — Other Ambulatory Visit: Payer: Self-pay | Admitting: Emergency Medicine

## 2013-07-07 DIAGNOSIS — E7800 Pure hypercholesterolemia, unspecified: Secondary | ICD-10-CM | POA: Diagnosis not present

## 2013-07-07 DIAGNOSIS — M519 Unspecified thoracic, thoracolumbar and lumbosacral intervertebral disc disorder: Secondary | ICD-10-CM | POA: Diagnosis not present

## 2013-07-07 DIAGNOSIS — Z1331 Encounter for screening for depression: Secondary | ICD-10-CM | POA: Diagnosis not present

## 2013-07-07 DIAGNOSIS — J449 Chronic obstructive pulmonary disease, unspecified: Secondary | ICD-10-CM | POA: Diagnosis not present

## 2013-07-07 DIAGNOSIS — M899 Disorder of bone, unspecified: Secondary | ICD-10-CM | POA: Diagnosis not present

## 2013-07-07 DIAGNOSIS — Z1211 Encounter for screening for malignant neoplasm of colon: Secondary | ICD-10-CM | POA: Diagnosis not present

## 2013-07-07 DIAGNOSIS — Z0000 Encounter for general adult medical examination without abnormal findings: Secondary | ICD-10-CM | POA: Diagnosis not present

## 2013-07-07 DIAGNOSIS — E559 Vitamin D deficiency, unspecified: Secondary | ICD-10-CM | POA: Diagnosis not present

## 2013-07-12 ENCOUNTER — Ambulatory Visit (INDEPENDENT_AMBULATORY_CARE_PROVIDER_SITE_OTHER)
Admission: RE | Admit: 2013-07-12 | Discharge: 2013-07-12 | Disposition: A | Discharge status: Home / Self Care | Payer: Medicare Other | Source: Ambulatory Visit | Attending: Adult Health | Admitting: Adult Health

## 2013-07-12 ENCOUNTER — Encounter: Payer: Self-pay | Admitting: Adult Health

## 2013-07-12 ENCOUNTER — Ambulatory Visit (INDEPENDENT_AMBULATORY_CARE_PROVIDER_SITE_OTHER): Payer: Medicare Other | Admitting: Adult Health

## 2013-07-12 VITALS — BP 140/84 | HR 97 | Temp 98.0°F | Ht 66.5 in | Wt 131.2 lb

## 2013-07-12 DIAGNOSIS — J111 Influenza due to unidentified influenza virus with other respiratory manifestations: Secondary | ICD-10-CM | POA: Diagnosis not present

## 2013-07-12 DIAGNOSIS — J449 Chronic obstructive pulmonary disease, unspecified: Secondary | ICD-10-CM

## 2013-07-12 MED ORDER — LEVOFLOXACIN 500 MG PO TABS: 500.0000 mg | ORAL_TABLET | Freq: Every day | ORAL | Status: AC

## 2013-07-12 MED ORDER — ALBUTEROL SULFATE HFA 108 (90 BASE) MCG/ACT IN AERS: INHALATION_SPRAY | RESPIRATORY_TRACT | Status: DC

## 2013-07-12 NOTE — Progress Notes (Signed)
History of Present Illness:  56 yowf quit smoking 2009 with severe COPD maintained on Advair and Spiriva. Has been on exertional O2. 2009>> FEV1 of 1.35 or 59% of predicted, an FVC of 3.1 or 98% of predicted, and a ratio of 43% consistent with severe airflow limitation.   ROV 10/05/12 -- f/u visit for severe COPD, allergic rhinitis, cough. Her regimen is Advair + Spiriva. She has done well for months, but tells me that she has a gastroenteritis over the last weekend. Now feels better, never developed resp sx. No flares since last visit. She uses SABA very rarely. She isn't doing [place] or loratadine right now, but plans to start in the Spring. She is active, does housework. Would have to stop to rest with several flights stairs. Rare wheeze. Rare cough.   ROV 04/04/13 -- severe COPD, allergic rhinitis, cough. She has been doing well physically but has been dealing w her husband having alzheimer's. She had some allergies this Spring, took loratadine and nasal washes prn. She remains on Advair on Spiriva. She uses albuterol very rarely. No exacerbations. Her activity level may be a little less, but it isn't breathing that is limiting her.   07/12/13 Acute OV  Complains of 4 days of sore throat, chills, fever of 101 highest, prod cough w/ brown phlem, chest tx, increase SOB. Not taking anything OTC.  Symptoms began suddenly with body aches, sore throat , fever , and cough. Today feels some better with no fever and not as achy. Now coughing up thick green mucus .  No chest pain , orthopnea , edema or n/v/d.    ROS Constitutional:   No  weight loss, night sweats,   +Fevers, chills, fatigue, or  lassitude.  HEENT:   No headaches,  Difficulty swallowing,  Tooth/dental problems, or   +Sore throat,                No sneezing, itching, ear ache, + nasal congestion, post nasal drip,   CV:  No chest pain,  Orthopnea, PND, swelling in lower extremities, anasarca, dizziness, palpitations, syncope.   GI  No  heartburn, indigestion, abdominal pain, nausea, vomiting, diarrhea, change in bowel habits, loss of appetite, bloody stools.   Resp:.  No chest wall deformity  Skin: no rash or lesions.  GU: no dysuria, change in color of urine, no urgency or frequency.  No flank pain, no hematuria   MS:  No joint pain or swelling.  No decreased range of motion.  No back pain.  Psych:  No change in mood or affect. No depression or anxiety.  No memory loss.       Filed Vitals:   07/12/13 1204  BP: 140/84  Pulse: 97  Temp: 98 F (36.7 C)  TempSrc: Oral  Height: 5' 6.5" (1.689 m)  Weight: 131 lb 3.2 oz (59.512 kg)  SpO2: 95%   Gen: Pleasant, elderly  in no distress,  normal affect  ENT: No lesions,  mouth clear,  oropharynx clear, no postnasal drip  Neck: No JVD, no TMG, no carotid bruits  Lungs: No use of accessory muscles, no dullness to percussion, clear without rales or rhonchi  Cardiovascular: RRR, heart sounds normal, no murmur or gallops, no peripheral edema  Musculoskeletal: No deformities, no cyanosis or clubbing  Neuro: alert, non focal  Skin: Warm, no lesions or rashes  No problem-specific assessment & plan notes found for this encounter.

## 2013-07-12 NOTE — Assessment & Plan Note (Addendum)
Probable Influenza with COPD flare  Now 4 days into Flu , fever resolved, -doubt tamiflu would be beneficial  >check cxr   Plan  Levaquin 500mg  daily for 7 days  Mucinex DM Twice daily  As needed  Cough/congestion  Fluids and rest  Please contact office for sooner follow up if symptoms do not improve or worsen or seek emergency care  I will call with xray results

## 2013-07-12 NOTE — Patient Instructions (Addendum)
Levaquin 500mg  daily for 7 days  Mucinex DM Twice daily  As needed  Cough/congestion  Fluids and rest  Please contact office for sooner follow up if symptoms do not improve or worsen or seek emergency care  I will call with xray results  Follow up Dr. Delton Coombes  In 6 weeks and As needed

## 2013-07-27 DIAGNOSIS — M949 Disorder of cartilage, unspecified: Secondary | ICD-10-CM | POA: Diagnosis not present

## 2013-07-27 DIAGNOSIS — M899 Disorder of bone, unspecified: Secondary | ICD-10-CM | POA: Diagnosis not present

## 2013-08-09 DIAGNOSIS — Z1211 Encounter for screening for malignant neoplasm of colon: Secondary | ICD-10-CM | POA: Diagnosis not present

## 2013-08-17 DIAGNOSIS — H35379 Puckering of macula, unspecified eye: Secondary | ICD-10-CM | POA: Diagnosis not present

## 2013-09-16 DIAGNOSIS — J449 Chronic obstructive pulmonary disease, unspecified: Secondary | ICD-10-CM | POA: Diagnosis not present

## 2013-09-16 DIAGNOSIS — I10 Essential (primary) hypertension: Secondary | ICD-10-CM | POA: Diagnosis not present

## 2013-09-18 ENCOUNTER — Other Ambulatory Visit: Payer: Self-pay | Admitting: Emergency Medicine

## 2013-09-18 DIAGNOSIS — I10 Essential (primary) hypertension: Secondary | ICD-10-CM | POA: Diagnosis not present

## 2013-09-18 DIAGNOSIS — F430 Acute stress reaction: Secondary | ICD-10-CM | POA: Diagnosis not present

## 2013-10-05 DIAGNOSIS — R030 Elevated blood-pressure reading, without diagnosis of hypertension: Secondary | ICD-10-CM | POA: Diagnosis not present

## 2013-10-25 ENCOUNTER — Encounter: Payer: Self-pay | Admitting: Emergency Medicine

## 2013-10-25 ENCOUNTER — Telehealth: Payer: Self-pay | Admitting: Emergency Medicine

## 2013-10-25 ENCOUNTER — Ambulatory Visit (INDEPENDENT_AMBULATORY_CARE_PROVIDER_SITE_OTHER): Payer: Medicare Other | Admitting: Emergency Medicine

## 2013-10-25 VITALS — BP 128/80 | HR 89 | Ht 66.0 in | Wt 119.0 lb

## 2013-10-25 DIAGNOSIS — J449 Chronic obstructive pulmonary disease, unspecified: Secondary | ICD-10-CM | POA: Diagnosis not present

## 2013-10-25 DIAGNOSIS — J309 Allergic rhinitis, unspecified: Secondary | ICD-10-CM | POA: Diagnosis not present

## 2013-10-25 NOTE — Patient Instructions (Addendum)
Please continue your Spiriva and Advair You may want to check you insurance formulary to see if Spiriva and Advair are the preferred (lowest tier) medications  Restart your Claritin 10mg  daily through the allergy season.  Follow with Dr Lamonte Sakai in 6 months or sooner if you have any problems

## 2013-10-25 NOTE — Assessment & Plan Note (Signed)
-   restart Claritin 10mg  daily through the allergy season

## 2013-10-25 NOTE — Progress Notes (Signed)
History of Present Illness:  63 yowf quit smoking 2009 with severe COPD maintained on Advair and Spiriva. Has been on exertional O2. 2009>> FEV1 of 1.35 or 59% of predicted, an FVC of 3.1 or 98% of predicted, and a ratio of 43% consistent with severe airflow limitation.   ROV 10/05/12 -- f/u visit for severe COPD, allergic rhinitis, cough. Her regimen is Advair + Spiriva. She has done well for months, but tells me that she has a gastroenteritis over the last weekend. Now feels better, never developed resp sx. No flares since last visit. She uses SABA very rarely. She isn't doing [place] or loratadine right now, but plans to start in the Spring. She is active, does housework. Would have to stop to rest with several flights stairs. Rare wheeze. Rare cough.   ROV 04/04/13 -- severe COPD, allergic rhinitis, cough. She has been doing well physically but has been dealing w her husband having alzheimer's. She had some allergies this Spring, took loratadine and nasal washes prn. She remains on Advair on Spiriva. She uses albuterol very rarely. No exacerbations. Her activity level may be a little less, but it isn't breathing that is limiting her.   07/12/13 Acute OV  Complains of 4 days of sore throat, chills, fever of 101 highest, prod cough w/ brown phlem, chest tx, increase SOB. Not taking anything OTC.  Symptoms began suddenly with body aches, sore throat , fever , and cough. Today feels some better with no fever and not as achy. Now coughing up thick green mucus .  No chest pain , orthopnea , edema or n/v/d.   ROV 10/25/13 -- severe COPD, allergic rhinitis, cough. She is on Spiriva and Advair. She reports that she has done fairly well, has had some cough recently. Not currently on regular allergy meds. She unfortunately just lost her husband.     Filed Vitals:   10/25/13 1053  BP: 128/80  Pulse: 89  Height: 5\' 6"  (1.676 m)  Weight: 199 lb (90.266 kg)  SpO2: 96%   Gen: Pleasant, elderly  in no distress,   normal affect  ENT: No lesions,  mouth clear,  oropharynx clear, no postnasal drip  Neck: No JVD, no TMG, no carotid bruits  Lungs: No use of accessory muscles, no dullness to percussion, clear without rales or rhonchi  Cardiovascular: RRR, heart sounds normal, no murmur or gallops, no peripheral edema  Musculoskeletal: No deformities, no cyanosis or clubbing  Neuro: alert, non focal  Skin: Warm, no lesions or rashes   COPD - same regimen - will check to see if formulary prefers other BD's   Allergic rhinitis - restart Claritin 10mg  daily through the allergy season

## 2013-10-25 NOTE — Assessment & Plan Note (Addendum)
-   same regimen - will check to see if formulary prefers other BD's

## 2013-10-25 NOTE — Telephone Encounter (Signed)
Per OV 10/25/13: Patient Instructions      Please continue your Spiriva and Advair You may want to check you insurance formulary to see if Spiriva and Advair are the preferred (lowest tier) medications   Restart your Claritin 10mg  daily through the allergy season.  Follow with Dr Lamonte Sakai in 6 months or sooner if you have any problems  --  Pt reports she checked over her paperwork when she got home. She looked at her weight on paper it was down as 199 lbs. She weighs 119. I advised will fix this. Nothing further needed

## 2013-11-14 ENCOUNTER — Telehealth: Payer: Self-pay | Admitting: Emergency Medicine

## 2013-11-14 MED ORDER — TIOTROPIUM BROMIDE MONOHYDRATE 18 MCG IN CAPS: 18.0000 ug | ORAL_CAPSULE | Freq: Every day | RESPIRATORY_TRACT | Status: DC

## 2013-11-14 NOTE — Telephone Encounter (Signed)
Rx has been sent in. lmtcb x1

## 2013-11-15 NOTE — Telephone Encounter (Signed)
Pt aware that Rx available for pick up  Nothing further needed.

## 2013-11-17 ENCOUNTER — Telehealth: Payer: Self-pay | Admitting: Emergency Medicine

## 2013-11-17 NOTE — Telephone Encounter (Signed)
Called spoke with pt. Pt reports her financial situation changed dramatically bc she lost her husband 4 weeks ago.  We called in spiriva for her on 11/14/13. She went to pick this up yesterday it was $445.99. She called insurance company what alternative/preffered for her medications. Was advised there was no alternative/preferred for spiriva/advair. Both are tier 3 medications.  She was then told after her deductible it will still be over $300. Was told for [place] to call [employer] clinical pharm review and submit a tier exception. [phone].  I advised pt will call and do this 1st thing Monday AM If we get this approved she will need RX sent to mail order bc she was told she could get this for free. She will need to set up an account with a mail order pharm 1st before we can send this.

## 2013-11-20 NOTE — Telephone Encounter (Signed)
I called [phone]. Was advised a form will be faxed for tier exception for advair and spiriva. This will be faxed to [phone]. Will forward to [place] to look out for.  Ref # [id number]

## 2013-11-21 NOTE — Telephone Encounter (Signed)
Forms received and completed. Placed in [place] folder to sign. I will forward message to [facility] to follow-up on approval/denial. [place], CMA

## 2013-11-24 DIAGNOSIS — H1130 Conjunctival hemorrhage, unspecified eye: Secondary | ICD-10-CM | POA: Diagnosis not present

## 2013-11-27 NOTE — Telephone Encounter (Signed)
RB has signed all [employer]  Papers and they have been faxed back [phone]. Pt is aware this has been done and awaiting decision.

## 2013-11-28 NOTE — Telephone Encounter (Signed)
Patient calling stating she is calling to give Ariel Gonzalez ID # [id number]

## 2013-11-28 NOTE — Telephone Encounter (Signed)
Pt aware we will contact her once we receive decision from [facility].

## 2013-12-08 NOTE — Telephone Encounter (Addendum)
Spoke with Gerald Stabs at [phone] and he stated that Advair 250/50 is approved as of 11/28/13 through 11/2014.  Spiriva has been denied and they will transfer me to pharm clinical line to do over the phone appeal (part D re-determination)  Spoke with Juliann Pulse in the re-determination department and gave her clinical information verbally. She stated I would need to fax clinical supporting information with Dx. And reason for appeal and why this medication is needed for this pt. Clinical notes and diagnosis has been faxed to # below.  Awaiting decision on appeal (up to 7 business days to get final determination)  [phone] Ref# [id number] 7 days for decision to be made.

## 2013-12-08 NOTE — Telephone Encounter (Signed)
Spoke with representative at patient insurance in regards to the tier exception for pt's Spiriva and Advair.  States they never received the forms for the Advair-- need these faxed again.  Joycelyn Rua was denied coverage d/t missing information on the original form faxed.  D/t this account now being closed since denial, an appeal will need to be completed.  Per representative, letter needs to state the reason and benefit of the patient having this medication.  Appeal dept # [phone] ID# [id number] Ref # [id number]  I was unable to get a live person on the phone in appeal dept-- automated message when number dialed. States that appeal letter needs to be mailed to  [place], [place]  [phone]  Please advise Trinda Pascal if you have a denial letter for the [place] on 11/29/13 - letter was faxed to [phone] Please advise also if you have original form for the Advair as they did not receive this when faxed.  Thanks.

## 2013-12-11 ENCOUNTER — Telehealth: Payer: Self-pay | Admitting: Emergency Medicine

## 2013-12-12 NOTE — Telephone Encounter (Signed)
Spoke with the pt and notified of recs per Meghan  She verbalized understanding  Will forward back to Meghan to f/u on final determination on spiriva

## 2013-12-12 NOTE — Telephone Encounter (Signed)
  Ariel Gonzalez at 12/08/2013 9:46 AM     Status: Addendum        Spoke with Gerald Stabs at [phone] and he stated that Advair 250/50 is approved as of 11/28/13 through 11/2014. Spiriva has been denied and they will transfer me to pharm clinical line to do over the phone appeal (part D re-determination)  Spoke with Juliann Pulse in the re-determination department and gave her clinical information verbally.  She stated I would need to fax clinical supporting information with Dx. And reason for appeal and why this medication is needed for this pt.  Clinical notes and diagnosis has been faxed to # below. Awaiting decision on appeal (up to 7 business days to get final determination)  [phone]  Ref# [id number]  [id number]  7 days for decision to be made.    LMOM x 1 for pt to return call

## 2013-12-14 NOTE — Telephone Encounter (Signed)
Spoke Ariel Gonzalez at [phone] in ref to appeal on spiriva.  Clinicals and dx faxed 12/08/13 with 7 business days to reply.  Case is still pending decision should be made by tomorrow at the latest. Pt aware of all of this and I will call tomorrow morning for status check.  Pt to come pick up samples of spiriva to last until decision made

## 2013-12-14 NOTE — Telephone Encounter (Signed)
Please advise Meghan thanks 

## 2013-12-15 NOTE — Telephone Encounter (Signed)
Per the 6.1.15 phone note that was created dealing w/ the same issue:  Rosana Berger, CMA at 12/12/2013 4:24 PM     Spoke with the pt and notified of recs per [facility]  She verbalized understanding  Will forward back to Meghan to f/u on final determination on spiriva

## 2013-12-15 NOTE — Telephone Encounter (Signed)
This is a duplicate message on the same issue - please see the 5.8.15 phone note for further details.  Will sign off.

## 2013-12-18 MED ORDER — TIOTROPIUM BROMIDE MONOHYDRATE 18 MCG IN CAPS: 18.0000 ug | ORAL_CAPSULE | Freq: Every day | RESPIRATORY_TRACT | Status: DC

## 2013-12-18 NOTE — Telephone Encounter (Signed)
Meghan, have you received any updates on pt's Spiriva?  Thanks!

## 2013-12-18 NOTE — Telephone Encounter (Signed)
Advised pt to call [facility] and have them run the rx again for spiriva. She verbalized understanding and had no further questions

## 2013-12-18 NOTE — Telephone Encounter (Signed)
Per Romie Minus in appeals department Spiriva has been approved through 07/12/14. I have left message for pt to call back to relay this information and see if she needs update rx sent to pharm

## 2013-12-19 ENCOUNTER — Other Ambulatory Visit: Payer: Self-pay

## 2013-12-19 DIAGNOSIS — Z1231 Encounter for screening mammogram for malignant neoplasm of breast: Secondary | ICD-10-CM

## 2014-01-08 DIAGNOSIS — I10 Essential (primary) hypertension: Secondary | ICD-10-CM | POA: Diagnosis not present

## 2014-01-08 DIAGNOSIS — F430 Acute stress reaction: Secondary | ICD-10-CM | POA: Diagnosis not present

## 2014-01-24 ENCOUNTER — Ambulatory Visit
Admission: RE | Admit: 2014-01-24 | Discharge: 2014-01-24 | Disposition: A | Discharge status: Home / Self Care | Payer: Medicare Other | Source: Ambulatory Visit

## 2014-01-24 DIAGNOSIS — Z1231 Encounter for screening mammogram for malignant neoplasm of breast: Secondary | ICD-10-CM

## 2014-02-21 ENCOUNTER — Telehealth: Payer: Self-pay | Admitting: Emergency Medicine

## 2014-02-21 MED ORDER — FLUTICASONE-SALMETEROL 250-50 MCG/DOSE IN AEPB: INHALATION_SPRAY | RESPIRATORY_TRACT | Status: DC

## 2014-02-21 NOTE — Telephone Encounter (Signed)
Called spoke with pt. She needed advair sent to [facility]. I have done so. Nothing further needed

## 2014-02-21 NOTE — Telephone Encounter (Signed)
Called spoke with pt. She reports her advair is $189.00 for 3 month supply. Pt is wanting [place] to do a tier exception.  She is bringing her paperwork tomorrow she has to do so. Samples also left for pick up. Nothing further needed

## 2014-02-22 ENCOUNTER — Telehealth: Payer: Self-pay | Admitting: Emergency Medicine

## 2014-02-22 NOTE — Telephone Encounter (Signed)
Forms will be left in triage regarding her spiriva approval.

## 2014-02-22 NOTE — Telephone Encounter (Signed)
Called spoke w/ pt. She is asking if we can do tier exception for her advair. # to call [phone] ID #: [id number] Called and was on hold x 15 min. Was then d/c'd. wcb

## 2014-02-23 NOTE — Telephone Encounter (Signed)
Tier exception form has been filled out and placed in RB look at to be signed on Monday when he is back in the office.

## 2014-02-23 NOTE — Telephone Encounter (Signed)
Called [employer] and spoke with tariq. He reports looking into pt insurance and advair is not eligible for tier exception. Also not able for appeal.  Called made pt aware. Nothing further needed

## 2014-02-23 NOTE — Telephone Encounter (Signed)
ATC # Provided. Was advised it was a "35 min wait d/t high call volume" wcb

## 2014-02-26 NOTE — Telephone Encounter (Signed)
Thank you for trying. We may need to consider change to an alternative

## 2014-05-04 ENCOUNTER — Ambulatory Visit (INDEPENDENT_AMBULATORY_CARE_PROVIDER_SITE_OTHER): Payer: Medicare Other | Admitting: Emergency Medicine

## 2014-05-04 ENCOUNTER — Encounter: Payer: Self-pay | Admitting: Emergency Medicine

## 2014-05-04 VITALS — BP 154/98 | HR 80 | Temp 97.8°F | Ht 66.5 in | Wt 129.2 lb

## 2014-05-04 DIAGNOSIS — J449 Chronic obstructive pulmonary disease, unspecified: Secondary | ICD-10-CM

## 2014-05-04 DIAGNOSIS — Z23 Encounter for immunization: Secondary | ICD-10-CM

## 2014-05-04 NOTE — Progress Notes (Signed)
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 Has been on exertional O2. 2009>> FEV1 of 1.35 or 59% of predicted, an FVC of 3.1 or 98% of predicted, and a ratio of 43% consistent with severe airflow limitation.   ROV 10/05/12 -- f/u visit for severe COPD, allergic rhinitis, cough. Her regimen is Advair + Spiriva. She has done well for months, but tells me that she has a gastroenteritis over the last weekend. Now feels better, never developed resp sx. No flares since last visit. She uses SABA very rarely. She isn't doing [place] or loratadine right now, but plans to start in the Spring. She is active, does housework. Would have to stop to rest with several flights stairs. Rare wheeze. Rare cough.   ROV 04/04/13 -- severe COPD, allergic rhinitis, cough. She has been doing well physically but has been dealing w her husband having alzheimer's. She had some allergies this Spring, took loratadine and nasal washes prn. She remains on Advair on Spiriva. She uses albuterol very rarely. No exacerbations. Her activity level may be a little less, but it isn't breathing that is limiting her.   07/12/13 Acute OV  Complains of 4 days of sore throat, chills, fever of 101 highest, prod cough w/ brown phlem, chest tx, increase SOB. Not taking anything OTC.  Symptoms began suddenly with body aches, sore throat , fever , and cough. Today feels some better with no fever and not as achy. Now coughing up thick green mucus .  No chest pain , orthopnea , edema or n/v/d.   ROV 10/25/13 -- severe COPD, allergic rhinitis, cough. She is on Spiriva and Advair. She reports that she has done fairly well, has had some cough recently. Not currently on regular allergy meds. She unfortunately just lost her husband.   ROV 05/04/14 -- followup visit for COPD allergic rhinitis and cough.she has been down after the loss of her husband, but she believes that her breathing has been stable.  She is concerned that she is about to have to start paying more for her inhaled meds due to new insurance. No flares since last time.     Filed Vitals:   05/04/14 1452  BP: 154/98  Pulse: 80  Temp: 97.8 F (36.6 C)  TempSrc: Oral  Height: 5' 6.5" (1.689 m)  Weight: 129 lb 3.2 oz (58.605 kg)  SpO2: 94%   Gen: Pleasant, elderly  in no distress,  normal affect  ENT: No lesions,  mouth clear,  oropharynx clear, no postnasal drip  Neck: No JVD, no TMG, no carotid bruits  Lungs: No use of accessory muscles, no dullness to percussion, clear without rales or rhonchi  Cardiovascular: RRR, heart sounds normal, no murmur or gallops, no peripheral edema  Musculoskeletal: No deformities, no cyanosis or clubbing  Neuro: alert, non focal  Skin: Warm, no lesions or rashes   COPD (chronic obstructive pulmonary disease) Please continue your Spiriva and Advair for now Look at your insurance medication formulary to see which tier coverage is available for [employer] and for Anoro. Both of these medications may be options for you in the future Flu Shot today Follow with Dr Lamonte Sakai in 6 months or sooner if you have any problems

## 2014-05-04 NOTE — Assessment & Plan Note (Signed)
Please continue your Spiriva and Advair for now Look at your insurance medication formulary to see which tier coverage is available for Stiolto and for Anoro. Both of these medications may be options for you in the future Flu Shot today Follow with Dr Lamonte Sakai in 6 months or sooner if you have any problems

## 2014-05-04 NOTE — Patient Instructions (Signed)
Please continue your Spiriva and Advair for now Look at your insurance medication formulary to see which tier coverage is available for Stiolto and for Anoro. Both of these medications may be options for you in the future Flu Shot today Follow with Dr Lamonte Sakai in 6 months or sooner if you have any problems

## 2014-06-01 DIAGNOSIS — F322 Major depressive disorder, single episode, severe without psychotic features: Secondary | ICD-10-CM | POA: Diagnosis not present

## 2014-06-01 DIAGNOSIS — R5383 Other fatigue: Secondary | ICD-10-CM | POA: Diagnosis not present

## 2014-06-01 DIAGNOSIS — F411 Generalized anxiety disorder: Secondary | ICD-10-CM | POA: Diagnosis not present

## 2014-06-13 ENCOUNTER — Telehealth: Payer: Self-pay | Admitting: Emergency Medicine

## 2014-06-13 NOTE — Telephone Encounter (Signed)
Yes - we had discussed getting the [place].  Can she set up a nurse visit to do this?

## 2014-06-13 NOTE — Telephone Encounter (Signed)
Pt was seen last month with rb, received flu shot, was told that she needed to wait until after 12/1 to get pneumonia vaccine.  Pt is calling to see if she needs an appt for this.  RB do you want pt to receive this, and if so does she need an appt with you?  Thanks!

## 2014-06-14 NOTE — Telephone Encounter (Signed)
Called pt and she is scheduled to come in for prevnar 13 on Monday 12/7. Nothing further needed

## 2014-06-18 ENCOUNTER — Ambulatory Visit (INDEPENDENT_AMBULATORY_CARE_PROVIDER_SITE_OTHER): Payer: Medicare Other

## 2014-06-18 DIAGNOSIS — Z23 Encounter for immunization: Secondary | ICD-10-CM

## 2014-07-09 DIAGNOSIS — E785 Hyperlipidemia, unspecified: Secondary | ICD-10-CM | POA: Diagnosis not present

## 2014-07-09 DIAGNOSIS — M8580 Other specified disorders of bone density and structure, unspecified site: Secondary | ICD-10-CM | POA: Diagnosis not present

## 2014-07-09 DIAGNOSIS — F410 Panic disorder [episodic paroxysmal anxiety] without agoraphobia: Secondary | ICD-10-CM | POA: Diagnosis not present

## 2014-07-09 DIAGNOSIS — F322 Major depressive disorder, single episode, severe without psychotic features: Secondary | ICD-10-CM | POA: Diagnosis not present

## 2014-07-09 DIAGNOSIS — J449 Chronic obstructive pulmonary disease, unspecified: Secondary | ICD-10-CM | POA: Diagnosis not present

## 2014-07-09 DIAGNOSIS — F39 Unspecified mood [affective] disorder: Secondary | ICD-10-CM | POA: Diagnosis not present

## 2014-07-09 DIAGNOSIS — Z1389 Encounter for screening for other disorder: Secondary | ICD-10-CM | POA: Diagnosis not present

## 2014-07-09 DIAGNOSIS — I10 Essential (primary) hypertension: Secondary | ICD-10-CM | POA: Diagnosis not present

## 2014-07-09 DIAGNOSIS — Z0000 Encounter for general adult medical examination without abnormal findings: Secondary | ICD-10-CM | POA: Diagnosis not present

## 2014-09-04 ENCOUNTER — Telehealth: Payer: Self-pay | Admitting: Emergency Medicine

## 2014-09-04 MED ORDER — ALBUTEROL SULFATE HFA 108 (90 BASE) MCG/ACT IN AERS: INHALATION_SPRAY | RESPIRATORY_TRACT | Status: DC

## 2014-09-04 MED ORDER — FLUTICASONE-SALMETEROL 250-50 MCG/DOSE IN AEPB: INHALATION_SPRAY | RESPIRATORY_TRACT | Status: DC

## 2014-09-04 MED ORDER — TIOTROPIUM BROMIDE MONOHYDRATE 18 MCG IN CAPS: 18.0000 ug | ORAL_CAPSULE | Freq: Every day | RESPIRATORY_TRACT | Status: DC

## 2014-09-04 NOTE — Telephone Encounter (Signed)
Rx's have been sent in. Pt is aware. Nothing further was needed. 

## 2014-09-10 DIAGNOSIS — H35379 Puckering of macula, unspecified eye: Secondary | ICD-10-CM | POA: Diagnosis not present

## 2014-09-10 DIAGNOSIS — H35373 Puckering of macula, bilateral: Secondary | ICD-10-CM | POA: Diagnosis not present

## 2014-09-13 ENCOUNTER — Telehealth: Payer: Self-pay | Admitting: Emergency Medicine

## 2014-09-13 MED ORDER — PREDNISONE 10 MG PO TABS: ORAL_TABLET | ORAL | Status: DC

## 2014-09-13 MED ORDER — DOXYCYCLINE HYCLATE 100 MG PO TABS
100.0000 mg | ORAL_TABLET | Freq: Two times a day (BID) | ORAL | Status: DC
Start: 2014-09-13 — End: 2015-06-13

## 2014-09-13 NOTE — Telephone Encounter (Signed)
Spoke with pt. Reports wheezing, coughing and SOB. Onset was 2 days ago. Did have a low grade fever last night. We do not have any available appointments today.  RB - please advise. Thanks.

## 2014-09-13 NOTE — Telephone Encounter (Signed)
Please give doxycycline 100mg  bid x 7 days Prednisone Take 40mg  daily for 3 days, then 30mg  daily for 3 days, then 20mg  daily for 3 days, then 10mg  daily for 3 days, then stop

## 2014-09-13 NOTE — Telephone Encounter (Signed)
Pt called back & wants to add that mucus is yellowish green. Her phone # [phone]

## 2014-09-18 ENCOUNTER — Telehealth: Payer: Self-pay | Admitting: Emergency Medicine

## 2014-09-18 NOTE — Telephone Encounter (Signed)
Per SN----  RB called in abx and prednisone on 3/3.  She will need to make sure she is taking her advair, spiriva and albuterol as directed.  Will need ROV is not any better.  thanks

## 2014-09-18 NOTE — Telephone Encounter (Signed)
Called and spoke with pt and she stated that she is still having the wheezing, cough with clear sputum, she is having a panic attack daily but is followed by her PCP for this.  She stated that the xanax does help, but she is worried about the wheezing. Pt was not able to come in today for appt, since she has no transportation.  Wanting recs on what to do about her symptoms and will come in next week for appt.  RB is out of the office today.  SN please advise. Thanks  Allergies  Allergen Reactions  . Cefaclor     rash  . Clarithromycin     rash  . Erythromycin     rash  . Iodine     rash    Current Outpatient Prescriptions on File Prior to Visit  Medication Sig Dispense Refill  . albuterol (PROAIR HFA) 108 (90 BASE) MCG/ACT inhaler INHALE 2 PUFFS EVERY 4 HOURS AS NEEDED 3 Inhaler 1  . ALPRAZolam (XANAX) 0.5 MG tablet Take 0.5 mg by mouth. 1 every 6 hours as needed     . amLODipine (NORVASC) 5 MG tablet Take 5 mg by mouth daily.    . calcium carbonate (OS-CAL) 600 MG TABS Take 600 mg by mouth daily.      . Cholecalciferol (PA VITAMIN D-3) 2000 UNITS CAPS Take by mouth daily.      Marland Kitchen doxycycline (VIBRA-TABS) 100 MG tablet Take 1 tablet (100 mg total) by mouth 2 (two) times daily. 14 tablet 0  . Fluticasone-Salmeterol (ADVAIR DISKUS) 250-50 MCG/DOSE AEPB USE 1 INHALATION TWICE A DAY 180 each 1  . Ibuprofen (ADVIL PO) Take by mouth as needed.    . Multiple Vitamin (MULTIVITAMIN) tablet Take 1 tablet by mouth daily.      . predniSONE (DELTASONE) 10 MG tablet 4 x 2 days, 3 x 2 days, 2 x 2 days, 1 x 2 days, then stop 30 tablet 0  . SUMAtriptan (IMITREX) 100 MG tablet Take 100 mg by mouth. Once daily as needed for migraine      . tiotropium (SPIRIVA HANDIHALER) 18 MCG inhalation capsule Place 1 capsule (18 mcg total) into inhaler and inhale daily. 90 capsule 1   No current facility-administered medications on file prior to visit.

## 2014-09-18 NOTE — Telephone Encounter (Signed)
Pt is aware is aware of SN' recommendations. Nothing further was needed.

## 2014-11-20 ENCOUNTER — Ambulatory Visit (INDEPENDENT_AMBULATORY_CARE_PROVIDER_SITE_OTHER): Payer: Medicare Other | Admitting: Emergency Medicine

## 2014-11-20 ENCOUNTER — Encounter: Payer: Self-pay | Admitting: Emergency Medicine

## 2014-11-20 VITALS — BP 136/72 | HR 79 | Ht 66.0 in | Wt 126.0 lb

## 2014-11-20 DIAGNOSIS — J309 Allergic rhinitis, unspecified: Secondary | ICD-10-CM | POA: Diagnosis not present

## 2014-11-20 DIAGNOSIS — J449 Chronic obstructive pulmonary disease, unspecified: Secondary | ICD-10-CM | POA: Diagnosis not present

## 2014-11-20 NOTE — Progress Notes (Signed)
History of Present Illness:  45 yowf quit smoking 2009 with severe COPD maintained on Advair and Spiriva. Has been on exertional O2. 2009>> FEV1 of 1.35 or 59% of predicted, an FVC of 3.1 or 98% of predicted, and a ratio of 43% consistent with severe airflow limitation.   ROV 10/05/12 -- f/u visit for severe COPD, allergic rhinitis, cough. Her regimen is Advair + Spiriva. She has done well for months, but tells me that she has a gastroenteritis over the last weekend. Now feels better, never developed resp sx. No flares since last visit. She uses SABA very rarely. She isn't doing [place] or loratadine right now, but plans to start in the Spring. She is active, does housework. Would have to stop to rest with several flights stairs. Rare wheeze. Rare cough.   ROV 04/04/13 -- severe COPD, allergic rhinitis, cough. She has been doing well physically but has been dealing w her husband having alzheimer's. She had some allergies this Spring, took loratadine and nasal washes prn. She remains on Advair on Spiriva. She uses albuterol very rarely. No exacerbations. Her activity level may be a little less, but it isn't breathing that is limiting her.   07/12/13 Acute OV  Complains of 4 days of sore throat, chills, fever of 101 highest, prod cough w/ brown phlem, chest tx, increase SOB. Not taking anything OTC.  Symptoms began suddenly with body aches, sore throat , fever , and cough. Today feels some better with no fever and not as achy. Now coughing up thick green mucus .  No chest pain , orthopnea , edema or n/v/d.   ROV 10/25/13 -- severe COPD, allergic rhinitis, cough. She is on Spiriva and Advair. She reports that she has done fairly well, has had some cough recently. Not currently on regular allergy meds. She unfortunately just lost her husband.   ROV 05/04/14 -- followup visit for COPD allergic rhinitis and cough.she has been down after the loss of her husband, but she believes that her breathing has been stable.  She is concerned that she is about to have to start paying more for her inhaled meds due to new insurance. No flares since last time.   ROV 12/21/14 -- hx of COPD and allergic rhinitis. She was treated for an AE in March with abx and doxy. She recovered well. She has had some increased drainage over the last month and has taken loratadine prn. Does [place] prn.  She remains on spiriva and advair. She uses proair very rarely. She remains active, no limitations due to breathing.      Filed Vitals:   11/20/14 1148  BP: 136/72  Pulse: 79  Height: 5\' 6"  (1.676 m)  Weight: 126 lb (57.153 kg)  SpO2: 92%   Gen: Pleasant, elderly  in no distress,  normal affect  ENT: No lesions,  mouth clear,  oropharynx clear, no postnasal drip  Neck: No JVD, no TMG, no carotid bruits  Lungs: No use of accessory muscles, no dullness to percussion, clear without rales or rhonchi  Cardiovascular: RRR, heart sounds normal, no murmur or gallops, no peripheral edema  Musculoskeletal: No deformities, no cyanosis or clubbing  Neuro: alert, non focal  Skin: Warm, no lesions or rashes   COPD (chronic obstructive pulmonary disease) She has had a single exacerbation in the last year. She does appear to be stable at this time. Her COPD has been influenced by her allergic rhinitis which is been slightly less well controlled over the last month.  She is improving, is stable on her current regimen. Will continue her allergy regimen in the Spring / Fall. continue spiriva and advair.   Allergic rhinitis Continue nasal saline and loratadine as needed, typically during spring and fall months

## 2014-11-20 NOTE — Assessment & Plan Note (Signed)
Continue nasal saline and loratadine as needed, typically during spring and fall months

## 2014-11-20 NOTE — Assessment & Plan Note (Signed)
She has had a single exacerbation in the last year. She does appear to be stable at this time. Her COPD has been influenced by her allergic rhinitis which is been slightly less well controlled over the last month. She is improving, is stable on her current regimen. Will continue her allergy regimen in the Spring / Fall. continue spiriva and advair.

## 2014-11-20 NOTE — Patient Instructions (Signed)
Please continue your same inhaled medications as you have been taking them .  Continue loratadine and nasal saline washes as needed Follow with Dr Lamonte Sakai in 6 months or sooner if you have any problems

## 2014-12-05 ENCOUNTER — Other Ambulatory Visit: Payer: Self-pay | Admitting: Family Medicine

## 2014-12-05 DIAGNOSIS — L820 Inflamed seborrheic keratosis: Secondary | ICD-10-CM | POA: Diagnosis not present

## 2014-12-17 ENCOUNTER — Other Ambulatory Visit: Payer: Self-pay

## 2014-12-17 DIAGNOSIS — Z1231 Encounter for screening mammogram for malignant neoplasm of breast: Secondary | ICD-10-CM

## 2014-12-20 ENCOUNTER — Telehealth: Payer: Self-pay | Admitting: Emergency Medicine

## 2014-12-20 MED ORDER — FLUTICASONE-SALMETEROL 250-50 MCG/DOSE IN AEPB: INHALATION_SPRAY | RESPIRATORY_TRACT | Status: DC

## 2014-12-20 MED ORDER — TIOTROPIUM BROMIDE MONOHYDRATE 18 MCG IN CAPS: 18.0000 ug | ORAL_CAPSULE | Freq: Every day | RESPIRATORY_TRACT | Status: DC

## 2014-12-20 NOTE — Telephone Encounter (Signed)
Pt requesting refills on Spiriva and Advair to be sent to [facility] mail order pharmacy. Rx's sent. Nothing further needed at this time.

## 2015-01-08 DIAGNOSIS — F410 Panic disorder [episodic paroxysmal anxiety] without agoraphobia: Secondary | ICD-10-CM | POA: Diagnosis not present

## 2015-01-08 DIAGNOSIS — M8580 Other specified disorders of bone density and structure, unspecified site: Secondary | ICD-10-CM | POA: Diagnosis not present

## 2015-01-08 DIAGNOSIS — F322 Major depressive disorder, single episode, severe without psychotic features: Secondary | ICD-10-CM | POA: Diagnosis not present

## 2015-01-08 DIAGNOSIS — F39 Unspecified mood [affective] disorder: Secondary | ICD-10-CM | POA: Diagnosis not present

## 2015-01-08 DIAGNOSIS — I10 Essential (primary) hypertension: Secondary | ICD-10-CM | POA: Diagnosis not present

## 2015-01-08 DIAGNOSIS — J449 Chronic obstructive pulmonary disease, unspecified: Secondary | ICD-10-CM | POA: Diagnosis not present

## 2015-01-29 ENCOUNTER — Ambulatory Visit
Admission: RE | Admit: 2015-01-29 | Discharge: 2015-01-29 | Disposition: A | Discharge status: Home / Self Care | Payer: Medicare Other | Source: Ambulatory Visit

## 2015-01-29 DIAGNOSIS — Z1231 Encounter for screening mammogram for malignant neoplasm of breast: Secondary | ICD-10-CM | POA: Diagnosis not present

## 2015-05-17 ENCOUNTER — Telehealth: Payer: Self-pay | Admitting: Emergency Medicine

## 2015-05-17 MED ORDER — FLUTICASONE-SALMETEROL 250-50 MCG/DOSE IN AEPB: INHALATION_SPRAY | RESPIRATORY_TRACT | Status: DC

## 2015-05-17 MED ORDER — TIOTROPIUM BROMIDE MONOHYDRATE 18 MCG IN CAPS: 18.0000 ug | ORAL_CAPSULE | Freq: Every day | RESPIRATORY_TRACT | Status: DC

## 2015-05-17 NOTE — Telephone Encounter (Signed)
Patient returned call, may be reached at [phone]

## 2015-05-17 NOTE — Telephone Encounter (Signed)
Called spoke with pt. She needs her advair and spiriva sent into [facility]. i have done so. Nothing further needed

## 2015-05-17 NOTE — Telephone Encounter (Signed)
lmomtcb x1 

## 2015-05-29 ENCOUNTER — Ambulatory Visit: Payer: Medicare Other | Admitting: Emergency Medicine

## 2015-06-13 ENCOUNTER — Encounter: Payer: Self-pay | Admitting: Emergency Medicine

## 2015-06-13 ENCOUNTER — Ambulatory Visit (INDEPENDENT_AMBULATORY_CARE_PROVIDER_SITE_OTHER): Payer: Medicare Other | Admitting: Emergency Medicine

## 2015-06-13 VITALS — BP 114/64 | HR 89 | Ht 66.5 in | Wt 130.0 lb

## 2015-06-13 DIAGNOSIS — J449 Chronic obstructive pulmonary disease, unspecified: Secondary | ICD-10-CM | POA: Diagnosis not present

## 2015-06-13 DIAGNOSIS — Z23 Encounter for immunization: Secondary | ICD-10-CM | POA: Diagnosis not present

## 2015-06-13 DIAGNOSIS — R059 Cough, unspecified: Secondary | ICD-10-CM | POA: Insufficient documentation

## 2015-06-13 DIAGNOSIS — J309 Allergic rhinitis, unspecified: Secondary | ICD-10-CM | POA: Diagnosis not present

## 2015-06-13 DIAGNOSIS — R05 Cough: Secondary | ICD-10-CM

## 2015-06-13 NOTE — Assessment & Plan Note (Signed)
Restart Claritin and call if not improved in 3 weks

## 2015-06-13 NOTE — Progress Notes (Signed)
History of Present Illness:  60 yowf quit smoking 2009 with severe COPD maintained on Advair and Spiriva. Has been on exertional O2. 2009>> FEV1 of 1.35 or 59% of predicted, an FVC of 3.1 or 98% of predicted, and a ratio of 43% consistent with severe airflow limitation.   ROV 10/05/12 -- f/u visit for severe COPD, allergic rhinitis, cough. Her regimen is Advair + Spiriva. She has done well for months, but tells me that she has a gastroenteritis over the last weekend. Now feels better, never developed resp sx. No flares since last visit. She uses SABA very rarely. She isn't doing [place] or loratadine right now, but plans to start in the Spring. She is active, does housework. Would have to stop to rest with several flights stairs. Rare wheeze. Rare cough.   ROV 04/04/13 -- severe COPD, allergic rhinitis, cough. She has been doing well physically but has been dealing w her husband having alzheimer's. She had some allergies this Spring, took loratadine and nasal washes prn. She remains on Advair on Spiriva. She uses albuterol very rarely. No exacerbations. Her activity level may be a little less, but it isn't breathing that is limiting her.   07/12/13 Acute OV  Complains of 4 days of sore throat, chills, fever of 101 highest, prod cough w/ brown phlem, chest tx, increase SOB. Not taking anything OTC.  Symptoms began suddenly with body aches, sore throat , fever , and cough. Today feels some better with no fever and not as achy. Now coughing up thick green mucus .  No chest pain , orthopnea , edema or n/v/d.   ROV 10/25/13 -- severe COPD, allergic rhinitis, cough. She is on Spiriva and Advair. She reports that she has done fairly well, has had some cough recently. Not currently on regular allergy meds. She unfortunately just lost her husband.   ROV 05/04/14 -- followup visit for COPD allergic rhinitis and cough.she has been down after the loss of her husband, but she believes that her breathing has been stable.  She is concerned that she is about to have to start paying more for her inhaled meds due to new insurance. No flares since last time.   ROV 12/21/14 -- hx of COPD and allergic rhinitis. She was treated for an AE in March with abx and doxy. She recovered well. She has had some increased drainage over the last month and has taken loratadine prn. Does [place] prn.  She remains on spiriva and advair. She uses proair very rarely. She remains active, no limitations due to breathing.     ROV  06/13/15 --Doing well, but noticed increased SOB with visit to higher altitude on exertion and needed to use inhaler several times.Took several days to recover. Needs Spiriva and Advair for maintenance. Has noticed some wheezing at night, but is not SOB when she notes it. Has a cough that started this spring. Sometimes productive with clear sputum, not clear if worse in morning or evening. No sick contacts.No previous history of GERD.    Filed Vitals:   06/13/15 1638  BP: 114/64  Pulse: 89  Height: 5' 6.5" (1.689 m)  Weight: 130 lb (58.968 kg)  SpO2: 93%   Gen: Pleasant, elderly  in no distress,  normal affect  ENT: No lesions,  mouth clear,  oropharynx slightly red, intermittent postnasal drip  Neck: No JVD, no TMG, no carotid bruits  Lungs: No use of accessory muscles, no dullness to percussion, clear without rales or rhonchi, but expiratory  wheeze noted with forced expiration.  Cardiovascular: RRR, heart sounds normal, no murmur or gallops, no peripheral edema  Musculoskeletal: No deformities, no cyanosis or clubbing  Neuro: alert, non focal  Skin: Warm, no lesions or rashes   COPD (chronic obstructive pulmonary disease) Resume Claritin 10 mg one time daily.  Continue  Spiriva and Advair as maintenance.  Albuterol inhaler prn for wheezing or  shortness of breath. If cough does not improve within 3 weeks, pt. to let us know so we can do further work up. Delsym cough syrup as needed for cough. Flu shot  today.   Allergic rhinitis Restart Claritin and call if not improved in 3 weks  Cough Start Claritin  As cough may be secondary to allergies. Pt. To call within 3 weeks if not improved for further work up.   Eric Form, NP   Attending Note:  I have examined patient, reviewed labs, studies and notes. I have discussed the case with Gladstone Pih, and I agree with the data and plans as amended above. She has more cough, suspect do to allergic rhinitis. She has wheeze on a forced expiration on my exam. Seems to be tolerating Advair and Spiriva - discussed with her the potential for Advair to exacerbate cough. If her cough does not improve with the addition of loratadine then will work up further with CXR, possibly airway exam.   Baltazar Apo, MD, PhD 06/13/2015, 5:49 PM [facility] [phone] or if no answer [phone]

## 2015-06-13 NOTE — Assessment & Plan Note (Signed)
Resume Claritin 10 mg one time daily.  Continue  Spiriva and Advair as maintenance.  Albuterol inhaler prn for wheezing or  shortness of breath. If cough does not improve within 3 weeks, pt. to let us know so we can do further work up. Delsym cough syrup as needed for cough. Flu shot today.

## 2015-06-13 NOTE — Patient Instructions (Addendum)
Continue your Spiriva and Advair as you are doing. Use your Albuterol inhaler as needed for wheezing or when you have shortness of breath. Resume Claritin 10 mg every day, as your cough may be allergies. If cough does not improve within 3 weeks, let us know so we can do further work up. Delsym cough syrup as needed for cough.. Flu shot today.

## 2015-06-13 NOTE — Assessment & Plan Note (Signed)
Start Claritin  As cough may be secondary to allergies. Pt. To call within 3 weeks if not improved for further work up.

## 2015-07-02 ENCOUNTER — Other Ambulatory Visit: Payer: Self-pay | Admitting: Acute Care

## 2015-07-02 DIAGNOSIS — Z87891 Personal history of nicotine dependence: Secondary | ICD-10-CM

## 2015-07-11 DIAGNOSIS — E785 Hyperlipidemia, unspecified: Secondary | ICD-10-CM | POA: Diagnosis not present

## 2015-07-11 DIAGNOSIS — F322 Major depressive disorder, single episode, severe without psychotic features: Secondary | ICD-10-CM | POA: Diagnosis not present

## 2015-07-11 DIAGNOSIS — E559 Vitamin D deficiency, unspecified: Secondary | ICD-10-CM | POA: Diagnosis not present

## 2015-07-11 DIAGNOSIS — F410 Panic disorder [episodic paroxysmal anxiety] without agoraphobia: Secondary | ICD-10-CM | POA: Diagnosis not present

## 2015-07-11 DIAGNOSIS — J449 Chronic obstructive pulmonary disease, unspecified: Secondary | ICD-10-CM | POA: Diagnosis not present

## 2015-07-11 DIAGNOSIS — Z1211 Encounter for screening for malignant neoplasm of colon: Secondary | ICD-10-CM | POA: Diagnosis not present

## 2015-07-11 DIAGNOSIS — F39 Unspecified mood [affective] disorder: Secondary | ICD-10-CM | POA: Diagnosis not present

## 2015-07-11 DIAGNOSIS — Z1389 Encounter for screening for other disorder: Secondary | ICD-10-CM | POA: Diagnosis not present

## 2015-07-11 DIAGNOSIS — I10 Essential (primary) hypertension: Secondary | ICD-10-CM | POA: Diagnosis not present

## 2015-07-11 DIAGNOSIS — Z0000 Encounter for general adult medical examination without abnormal findings: Secondary | ICD-10-CM | POA: Diagnosis not present

## 2015-07-11 DIAGNOSIS — M8580 Other specified disorders of bone density and structure, unspecified site: Secondary | ICD-10-CM | POA: Diagnosis not present

## 2015-07-23 ENCOUNTER — Inpatient Hospital Stay: Admission: RE | Admit: 2015-07-23 | Payer: Medicare Other | Source: Ambulatory Visit

## 2015-07-23 ENCOUNTER — Encounter: Payer: Medicare Other | Admitting: Acute Care

## 2015-07-29 ENCOUNTER — Other Ambulatory Visit: Payer: Self-pay | Admitting: Acute Care

## 2015-07-30 ENCOUNTER — Ambulatory Visit (INDEPENDENT_AMBULATORY_CARE_PROVIDER_SITE_OTHER)
Admission: RE | Admit: 2015-07-30 | Discharge: 2015-07-30 | Disposition: A | Discharge status: Home / Self Care | Payer: Medicare Other | Source: Ambulatory Visit | Attending: Acute Care | Admitting: Acute Care

## 2015-07-30 ENCOUNTER — Ambulatory Visit (INDEPENDENT_AMBULATORY_CARE_PROVIDER_SITE_OTHER): Payer: Medicare Other | Admitting: Acute Care

## 2015-07-30 ENCOUNTER — Encounter: Payer: Self-pay | Admitting: Acute Care

## 2015-07-30 DIAGNOSIS — Z87891 Personal history of nicotine dependence: Secondary | ICD-10-CM | POA: Diagnosis not present

## 2015-07-30 NOTE — Progress Notes (Signed)
Shared Decision Making Visit Lung Cancer Screening Program [phone])   Eligibility:  Age 75 y.o.  Pack Years Smoking History Calculation 50 pack years (# packs/per year x # years smoked)  Recent History of coughing up blood  no  Unexplained weight loss? no ( >Than 15 pounds within the last 6 months )  Prior History Lung / other cancer no (Diagnosis within the last 5 years already requiring surveillance chest CT Scans).  Smoking Status Former Smoker  Former Smokers: Years since quit: 8 years  Quit Date: 04/01/2008  Visit Components:  Discussion included one or more decision making aids. yes  Discussion included risk/benefits of screening. yes  Discussion included potential follow up diagnostic testing for abnormal scans. yes  Discussion included meaning and risk of over diagnosis. yes  Discussion included meaning and risk of False Positives. yes  Discussion included meaning of total radiation exposure. yes  Counseling Included:  Importance of adherence to annual lung cancer LDCT screening. yes  Impact of comorbidities on ability to participate in the program. yes  Ability and willingness to under diagnostic treatment. yes  Smoking Cessation Counseling:  Current Smokers:   Discussed importance of smoking cessation.NA; Former smoker  Information about tobacco cessation classes and interventions provided to patient. yes  Patient provided with "ticket" for LDCT Scan. yes  Symptomatic Patient. no  Counseling: former smoker  Diagnosis Code: Tobacco Use Z72.0  Asymptomatic Patient yes  Counseling Former smoker  Former Smokers:   Discussed the importance of maintaining cigarette abstinence. yes  Diagnosis Code: Personal History of Nicotine Dependence. [id number]  Information about tobacco cessation classes and interventions provided to patient. Yes  Patient provided with "ticket" for LDCT Scan. yes  Written Order for Lung Cancer Screening with LDCT placed in  [facility]. Yes (CT Chest Lung Cancer Screening Low Dose W/O CM) [id number] Z12.2-Screening of respiratory organs Z87.891-Personal history of nicotine dependence  I spent 15 minutes of face to face time with Ariel Gonzalez discussing the risks and benefits of lung cancer screening. We viewed a power point together that explained in detail the above noted topics. We took the time to pause the power point at intervals to allow for questions to be asked and answered to ensure understanding. We discussed that she had taken the single most powerful action possible to decrease her risk of developing lung cancer when she quit smoking. I counseled her to remain smoke free, and to contact me if she ever had the desire to smoke again so that I can provide resources and tools to help support the effort to remain smoke free. We discussed the time and location of the scan, and that either [facility] or I will call with the results within  24-48 hours of receiving them. [name] has my card and contact information in the event she needs to speak with me, in addition to a copy of the power point we reviewed as a resource. She verbalized understanding of all of the above and had no further questions upon leaving the office.    Magdalen Spatz, NP

## 2015-08-05 ENCOUNTER — Telehealth: Payer: Self-pay | Admitting: Emergency Medicine

## 2015-08-05 MED ORDER — TIOTROPIUM BROMIDE MONOHYDRATE 18 MCG IN CAPS: 18.0000 ug | ORAL_CAPSULE | Freq: Every day | RESPIRATORY_TRACT | Status: DC

## 2015-08-05 MED ORDER — FLUTICASONE-SALMETEROL 250-50 MCG/DOSE IN AEPB: INHALATION_SPRAY | RESPIRATORY_TRACT | Status: DC

## 2015-08-05 NOTE — Telephone Encounter (Signed)
Spoke with pt. Needs refills on Spiriva and Advair. Both prescriptions have been sent to [facility]. Nothing further was needed.

## 2015-08-20 DIAGNOSIS — M859 Disorder of bone density and structure, unspecified: Secondary | ICD-10-CM | POA: Diagnosis not present

## 2015-08-20 DIAGNOSIS — M8589 Other specified disorders of bone density and structure, multiple sites: Secondary | ICD-10-CM | POA: Diagnosis not present

## 2015-09-12 ENCOUNTER — Ambulatory Visit: Payer: Medicare Other | Admitting: Cardiovascular Disease

## 2015-10-02 ENCOUNTER — Ambulatory Visit (INDEPENDENT_AMBULATORY_CARE_PROVIDER_SITE_OTHER): Payer: Medicare Other | Admitting: Cardiovascular Disease

## 2015-10-02 ENCOUNTER — Encounter: Payer: Self-pay | Admitting: Cardiovascular Disease

## 2015-10-02 VITALS — BP 110/76 | HR 83 | Ht 66.5 in | Wt 131.6 lb

## 2015-10-02 DIAGNOSIS — I2510 Atherosclerotic heart disease of native coronary artery without angina pectoris: Secondary | ICD-10-CM | POA: Diagnosis not present

## 2015-10-02 DIAGNOSIS — E785 Hyperlipidemia, unspecified: Secondary | ICD-10-CM | POA: Diagnosis not present

## 2015-10-02 DIAGNOSIS — I2584 Coronary atherosclerosis due to calcified coronary lesion: Secondary | ICD-10-CM | POA: Diagnosis not present

## 2015-10-02 MED ORDER — ATORVASTATIN CALCIUM 40 MG PO TABS: 40.0000 mg | ORAL_TABLET | Freq: Every day | ORAL | Status: DC

## 2015-10-02 NOTE — Progress Notes (Signed)
Cardiology Office Note   Date:  10/02/2015   ID:  Ariel Gonzalez, DOB 08/21/1940, MRN [id number]  PCP:  Simona Huh, MD  Cardiologist:    Thayer Headings, MD   Chief Complaint  Patient presents with  . Coronary Artery Disease   Problem List 1. COPD 2. Coronary Artery Calcification    History of Present Illness: Ariel Gonzalez is a 75 y.o. female who presents for coronary artery calcifications .  Records from Dr. Marisue Humble were reviewed.  She has a history of smoking. A CT scan was recently performed to evaluate her for the possibility of lung cancer. She was found have coronary artery calcifications.  Has had a chronic cough.  Does not exercise  - has COPD  Uses home O2 at times - especially after being in the shower.   Stays active.  No syncope.  Lives at the [place] in [place], laundry, shopping - never has CP .  stopped smoking - 2 years    Past Medical History  Diagnosis Date  . COPD (chronic obstructive pulmonary disease) ([place])     HFA 75% p coaching July 17, 2010  . HTN (hypertension)   . Panic attacks   . Arthritis     CERVICAL AND LUMBER DDD  . Allergic rhinitis   . Overactive bladder   . Osteopenia   . Squamous cell carcinoma of skin   . Migraine     Past Surgical History  Procedure Laterality Date  . Cholecystectomy    . Total abdominal hysterectomy w/ bilateral salpingoophorectomy    . Cataracts Bilateral 2009  . Jaw bone graft  2009  . Betatched retina  2010     Current Outpatient Prescriptions  Medication Sig Dispense Refill  . albuterol (PROAIR HFA) 108 (90 BASE) MCG/ACT inhaler INHALE 2 PUFFS EVERY 4 HOURS AS NEEDED (Patient taking differently: Inhale 2 puffs into the lungs every 4 (four) hours as needed. INHALE 2 PUFFS EVERY 4 HOURS AS NEEDED) 3 Inhaler 1  . ALPRAZolam (XANAX) 0.5 MG tablet Take 0.5 mg by mouth 3 (three) times daily as needed for anxiety or sleep. 1 every 6 hours as needed    . amLODipine (NORVASC) 5  MG tablet Take 5 mg by mouth daily.    . calcium carbonate (OS-CAL) 600 MG TABS Take 600 mg by mouth daily.      . Cholecalciferol (PA VITAMIN D-3) 2000 UNITS CAPS Take by mouth daily.      . citalopram (CELEXA) 20 MG tablet Take 20 mg by mouth daily.    . Fluticasone-Salmeterol (ADVAIR DISKUS) 250-50 MCG/DOSE AEPB USE 1 INHALATION TWICE A DAY (Patient taking differently: Inhale 1 puff into the lungs 2 (two) times daily. USE 1 INHALATION TWICE A DAY) 180 each 1  . Ibuprofen (ADVIL PO) Take 1 tablet by mouth as needed (AS NEEDED FOR MODERATE PAIN AND HEADCAHES).     . Multiple Vitamin (MULTIVITAMIN) tablet Take 1 tablet by mouth daily.      Marland Kitchen tiotropium (SPIRIVA HANDIHALER) 18 MCG inhalation capsule Place 1 capsule (18 mcg total) into inhaler and inhale daily. 90 capsule 1   No current facility-administered medications for this visit.    Allergies:   Cefaclor; Clarithromycin; Erythromycin; and Iodine    Social History:  The patient  reports that she quit smoking about 7 years ago. Her smoking use included Cigarettes. She has a 50 pack-year smoking history. She has never used smokeless tobacco. She reports that she  does not drink alcohol or use illicit drugs.   Family History:  The patient's family history includes Cancer in her mother and sister; Coronary artery disease in her father; Heart attack in her father.   Father died at age 65 due to MI .    ROS:  Please see the history of present illness.    Review of Systems: Constitutional:  denies fever, chills, diaphoresis, appetite change and fatigue.  HEENT: denies photophobia, eye pain, redness, hearing loss, ear pain, congestion, sore throat, rhinorrhea, sneezing, neck pain, neck stiffness and tinnitus.  Respiratory: denies SOB, DOE, cough, chest tightness, and wheezing.  Cardiovascular: denies chest pain, palpitations and leg swelling.  Gastrointestinal: denies nausea, vomiting, abdominal pain, diarrhea, constipation, blood in stool.    Genitourinary: denies dysuria, urgency, frequency, hematuria, flank pain and difficulty urinating.  Musculoskeletal: denies  myalgias, back pain, joint swelling, arthralgias and gait problem.   Skin: denies pallor, rash and wound.  Neurological: denies dizziness, seizures, syncope, weakness, light-headedness, numbness and headaches.   Hematological: denies adenopathy, easy bruising, personal or family bleeding history.  Psychiatric/ Behavioral: denies suicidal ideation, mood changes, confusion, nervousness, sleep disturbance and agitation.       All other systems are reviewed and negative.    PHYSICAL EXAM: VS:  BP 110/76 mmHg  Pulse 83  Ht 5' 6.5" (1.689 m)  Wt 131 lb 9.6 oz (59.693 kg)  BMI 20.92 kg/m2 , BMI Body mass index is 20.92 kg/(m^2). GEN: Well nourished, well developed, in no acute distress HEENT: normal Neck: no JVD, carotid bruits, or masses Cardiac: RRR; no murmurs, rubs, or gallops,no edema  Respiratory:  clear to auscultation bilaterally, normal work of breathing GI: soft, nontender, nondistended, + BS MS: no deformity or atrophy Skin: warm and dry, no rash Neuro:  Strength and sensation are intact Psych: normal   EKG:  EKG is ordered today. The ekg ordered today demonstrates NSR at 83.  NS ST abn inferiorly    Recent Labs: No results found for requested labs within last 365 days.    Lipid Panel No results found for: CHOL, TRIG, HDL, CHOLHDL, VLDL, LDLCALC, LDLDIRECT   Labs from [place] office shows an LDL of 135, total of 230   Wt Readings from Last 3 Encounters:  10/02/15 131 lb 9.6 oz (59.693 kg)  06/13/15 130 lb (58.968 kg)  11/20/14 126 lb (57.153 kg)      Other studies Reviewed: Additional studies/ records that were reviewed today include: . Review of the above records demonstrates:    ASSESSMENT AND PLAN:  1.  Coronary artery calcifications Ariel Gonzalez has A long history of smoking.  She had a recent CT scan and was found to have coronary  artery calcifications.  She has NS ST abn on her ECG.   I think that we should get a Lexiscan myoview for further eval . She has no symptoms of angina - but is not able to exercise to any degree because  Of her COPD .   2. Hyperlipidemia:   LDL is 135.   Has evidence of CAD .   Goal is 70  Will start Atorvastatin 40 .  I'll see her in 3 months for OV and fasting lipids, liver, BMP .    Current medicines are reviewed at length with the patient today.  The patient does not have concerns regarding medicines.  The following changes have been made:  no change  Labs/ tests ordered today include:  No orders of the defined types were  placed in this encounter.     Disposition:   FU with me in 3 months      Dayanne Yiu, Wonda Cheng, MD  10/02/2015 9:20 AM    [facility] [place], [place], [place]  [address] Phone: [phone]; Fax: [phone]   [facility]  [address] [place], [place]  [address] [phone]   Fax [phone]

## 2015-10-02 NOTE — Patient Instructions (Signed)
Medication Instructions:  START Atorvastatin 40 mg once daily   Labwork: Your physician recommends that you return for lab work in: 3 months on the day of or a few days before your office visit with Dr. Acie Fredrickson.  You will need to FAST for this appointment - nothing to eat or drink after midnight the night before except water.    Testing/Procedures: Your physician has requested that you have a lexiscan myoview. For further information please visit [email or web address]. Please follow instruction sheet, as given.    Follow-Up: Your physician recommends that you schedule a follow-up appointment in: 3 months with Dr. Acie Fredrickson.    If you need a refill on your cardiac medications before your next appointment, please call your pharmacy.   Thank you for choosing [facility]! Christen Bame, RN [phone]

## 2015-10-07 ENCOUNTER — Telehealth (HOSPITAL_COMMUNITY): Payer: Self-pay | Admitting: *Deleted

## 2015-10-07 NOTE — Telephone Encounter (Signed)
Patient given detailed instructions per Myocardial Perfusion Study Information Sheet for the test on 10/10/15 at 0945. Patient notified to arrive 15 minutes early and that it is imperative to arrive on time for appointment to keep from having the test rescheduled.  If you need to cancel or reschedule your appointment, please call the office within 24 hours of your appointment. Failure to do so may result in a cancellation of your appointment, and a $50 no show fee. Patient verbalized understanding.Abie Killian, Ranae Palms

## 2015-10-10 ENCOUNTER — Ambulatory Visit (HOSPITAL_COMMUNITY): Payer: Medicare Other | Attending: Cardiovascular Disease

## 2015-10-10 DIAGNOSIS — I10 Essential (primary) hypertension: Secondary | ICD-10-CM | POA: Diagnosis not present

## 2015-10-10 DIAGNOSIS — R0602 Shortness of breath: Secondary | ICD-10-CM | POA: Diagnosis not present

## 2015-10-10 DIAGNOSIS — I2584 Coronary atherosclerosis due to calcified coronary lesion: Secondary | ICD-10-CM | POA: Diagnosis not present

## 2015-10-10 DIAGNOSIS — I2510 Atherosclerotic heart disease of native coronary artery without angina pectoris: Secondary | ICD-10-CM

## 2015-10-10 MED ORDER — TECHNETIUM TC 99M SESTAMIBI GENERIC - CARDIOLITE
31.6000 | Freq: Once | INTRAVENOUS | Status: AC | PRN
  Administered 2015-10-10: 32 via INTRAVENOUS

## 2015-10-10 MED ORDER — REGADENOSON 0.4 MG/5ML IV SOLN
0.4000 mg | Freq: Once | INTRAVENOUS | Status: AC
  Administered 2015-10-10: 0.4 mg via INTRAVENOUS

## 2015-10-10 MED ORDER — TECHNETIUM TC 99M SESTAMIBI GENERIC - CARDIOLITE
10.4000 | Freq: Once | INTRAVENOUS | Status: AC | PRN
  Administered 2015-10-10: 10 via INTRAVENOUS

## 2015-10-11 LAB — MYOCARDIAL PERFUSION IMAGING
CHL CUP NUCLEAR SSS: 11
LHR: 0.31
LVDIAVOL: 65 mL (ref 46–106)
LVSYSVOL: 23 mL
Peak HR: 90 {beats}/min
Rest HR: 69 {beats}/min
SDS: 1
SRS: 10
TID: 1.07

## 2015-12-12 ENCOUNTER — Other Ambulatory Visit (INDEPENDENT_AMBULATORY_CARE_PROVIDER_SITE_OTHER): Payer: Medicare Other | Admitting: *Deleted

## 2015-12-12 DIAGNOSIS — J410 Simple chronic bronchitis: Secondary | ICD-10-CM | POA: Diagnosis not present

## 2015-12-12 DIAGNOSIS — I2584 Coronary atherosclerosis due to calcified coronary lesion: Secondary | ICD-10-CM

## 2015-12-12 DIAGNOSIS — I2510 Atherosclerotic heart disease of native coronary artery without angina pectoris: Secondary | ICD-10-CM | POA: Diagnosis not present

## 2015-12-12 DIAGNOSIS — E785 Hyperlipidemia, unspecified: Secondary | ICD-10-CM

## 2015-12-12 LAB — COMPREHENSIVE METABOLIC PANEL
ALT: 17 U/L (ref 6–29)
AST: 17 U/L (ref 10–35)
Albumin: 4.2 g/dL (ref 3.6–5.1)
Alkaline Phosphatase: 85 U/L (ref 33–130)
BILIRUBIN TOTAL: 1 mg/dL (ref 0.2–1.2)
BUN: 16 mg/dL (ref 7–25)
CO2: 29 mmol/L (ref 20–31)
Calcium: 9.1 mg/dL (ref 8.6–10.4)
Chloride: 103 mmol/L (ref 98–110)
Creat: 0.72 mg/dL (ref 0.60–0.93)
GLUCOSE: 76 mg/dL (ref 65–99)
Potassium: 4 mmol/L (ref 3.5–5.3)
Sodium: 140 mmol/L (ref 135–146)
Total Protein: 6.3 g/dL (ref 6.1–8.1)

## 2015-12-12 LAB — LIPID PANEL
CHOLESTEROL: 137 mg/dL (ref 125–200)
HDL: 86 mg/dL (ref >=46)
LDL Cholesterol: 36 mg/dL (ref <130)
TRIGLYCERIDES: 75 mg/dL (ref <150)
Total CHOL/HDL Ratio: 1.6 Ratio (ref <=5.0)
VLDL: 15 mg/dL (ref <30)

## 2015-12-12 NOTE — Addendum Note (Signed)
Addended by: Eulis Foster on: 12/12/2015 09:18 AM   Modules accepted: Orders

## 2015-12-18 ENCOUNTER — Encounter: Payer: Self-pay | Admitting: Cardiovascular Disease

## 2015-12-18 ENCOUNTER — Ambulatory Visit (INDEPENDENT_AMBULATORY_CARE_PROVIDER_SITE_OTHER): Payer: Medicare Other | Admitting: Cardiovascular Disease

## 2015-12-18 VITALS — BP 114/50 | HR 70 | Ht 66.5 in | Wt 132.4 lb

## 2015-12-18 DIAGNOSIS — E785 Hyperlipidemia, unspecified: Secondary | ICD-10-CM

## 2015-12-18 DIAGNOSIS — I2510 Atherosclerotic heart disease of native coronary artery without angina pectoris: Secondary | ICD-10-CM | POA: Diagnosis not present

## 2015-12-18 DIAGNOSIS — I2584 Coronary atherosclerosis due to calcified coronary lesion: Secondary | ICD-10-CM

## 2015-12-18 MED ORDER — ATORVASTATIN CALCIUM 40 MG PO TABS: 40.0000 mg | ORAL_TABLET | Freq: Every day | ORAL | Status: DC

## 2015-12-18 NOTE — Progress Notes (Signed)
Cardiology Office Note   Date:  12/18/2015   ID:  [name], DOB [birth year], MRN [id number]  PCP:  Simona Huh, MD  Cardiologist:    Mertie Moores, MD   No chief complaint on file.  Problem List 1. COPD 2. Coronary Artery Calcification    History of Present Illness: Ariel Gonzalez is a 75 y.o. female who presents for coronary artery calcifications .  Records from Dr. Marisue Humble were reviewed.  She has a history of smoking. A CT scan was recently performed to evaluate her for the possibility of lung cancer. She was found have coronary artery calcifications.  Has had a chronic cough.  Does not exercise  - has COPD  Uses home O2 at times - especially after being in the shower.   Stays active.  No syncope.  Lives at the [place] in [place], laundry, shopping - never has CP .  stopped smoking - 2 years   12/18/2015: Ariel Gonzalez  presents today for follow-up of her coronary calcifications. She's had a stress Myoview study that was low risk and she has no ischemia. She has normal left trigger systolic function with an ejection fraction of 66%.  No CP , chronic COPD - unable to walk  Past Medical History  Diagnosis Date  . COPD (chronic obstructive pulmonary disease) ([place])     HFA 75% p coaching July 17, 2010  . HTN (hypertension)   . Panic attacks   . Arthritis     CERVICAL AND LUMBER DDD  . Allergic rhinitis   . Overactive bladder   . Osteopenia   . Squamous cell carcinoma of skin   . Migraine     Past Surgical History  Procedure Laterality Date  . Cholecystectomy    . Total abdominal hysterectomy w/ bilateral salpingoophorectomy    . Cataracts Bilateral 2009  . Jaw bone graft  2009  . Betatched retina  2010     Current Outpatient Prescriptions  Medication Sig Dispense Refill  . albuterol (PROAIR HFA) 108 (90 BASE) MCG/ACT inhaler INHALE 2 PUFFS EVERY 4 HOURS AS NEEDED (Patient taking differently: Inhale 2 puffs into the lungs every 4 (four)  hours as needed. INHALE 2 PUFFS EVERY 4 HOURS AS NEEDED) 3 Inhaler 1  . ALPRAZolam (XANAX) 0.5 MG tablet Take 0.5 mg by mouth 3 (three) times daily as needed for anxiety or sleep. 1 every 6 hours as needed    . amLODipine (NORVASC) 5 MG tablet Take 5 mg by mouth daily.    Marland Kitchen atorvastatin (LIPITOR) 40 MG tablet Take 1 tablet (40 mg total) by mouth daily. 30 tablet 11  . calcium carbonate (OS-CAL) 600 MG TABS Take 600 mg by mouth daily.      . Cholecalciferol (PA VITAMIN D-3) 2000 UNITS CAPS Take 2,000 Units by mouth daily.     . citalopram (CELEXA) 20 MG tablet Take 20 mg by mouth daily.    . Fluticasone-Salmeterol (ADVAIR DISKUS) 250-50 MCG/DOSE AEPB USE 1 INHALATION TWICE A DAY (Patient taking differently: Inhale 1 puff into the lungs 2 (two) times daily. USE 1 INHALATION TWICE A DAY) 180 each 1  . Ibuprofen (ADVIL PO) Take 1 tablet by mouth as needed (AS NEEDED FOR MODERATE PAIN AND HEADCAHES).     . Multiple Vitamin (MULTIVITAMIN) tablet Take 1 tablet by mouth daily.      Marland Kitchen tiotropium (SPIRIVA HANDIHALER) 18 MCG inhalation capsule Place 1 capsule (18 mcg total) into inhaler and inhale daily. 90 capsule  1   No current facility-administered medications for this visit.    Allergies:   Cefaclor; Clarithromycin; Erythromycin; and Iodine    Social History:  The patient  reports that she quit smoking about 7 years ago. Her smoking use included Cigarettes. She has a 50 pack-year smoking history. She has never used smokeless tobacco. She reports that she does not drink alcohol or use illicit drugs.   Family History:  The patient's family history includes Cancer in her mother and sister; Coronary artery disease in her father; Heart attack in her father.   Father died at age 72 due to MI .    ROS:  Please see the history of present illness.    Review of Systems: Constitutional:  denies fever, chills, diaphoresis, appetite change and fatigue.  HEENT: denies photophobia, eye pain, redness, hearing  loss, ear pain, congestion, sore throat, rhinorrhea, sneezing, neck pain, neck stiffness and tinnitus.  Respiratory: denies SOB, DOE, cough, chest tightness, and wheezing.  Cardiovascular: denies chest pain, palpitations and leg swelling.  Gastrointestinal: denies nausea, vomiting, abdominal pain, diarrhea, constipation, blood in stool.  Genitourinary: denies dysuria, urgency, frequency, hematuria, flank pain and difficulty urinating.  Musculoskeletal: denies  myalgias, back pain, joint swelling, arthralgias and gait problem.   Skin: denies pallor, rash and wound.  Neurological: denies dizziness, seizures, syncope, weakness, light-headedness, numbness and headaches.   Hematological: denies adenopathy, easy bruising, personal or family bleeding history.  Psychiatric/ Behavioral: denies suicidal ideation, mood changes, confusion, nervousness, sleep disturbance and agitation.       All other systems are reviewed and negative.    PHYSICAL EXAM: VS:  BP 114/50 mmHg  Pulse 70  Ht 5' 6.5" (1.689 m)  Wt 132 lb 6.4 oz (60.056 kg)  BMI 21.05 kg/m2 , BMI Body mass index is 21.05 kg/(m^2). GEN: Well nourished, well developed, in no acute distress HEENT: normal Neck: no JVD, carotid bruits, or masses Cardiac: RRR; no murmurs, rubs, or gallops,no edema  Respiratory:  miild wheezing  GI: soft, nontender, nondistended, + BS MS: no deformity or atrophy Skin: warm and dry, no rash Neuro:  Strength and sensation are intact Psych: normal  EKG:  EKG is not ordered today.  Recent Labs: 12/12/2015: ALT 17; BUN 16; Creat 0.72; Potassium 4.0; Sodium 140    Lipid Panel    Component Value Date/Time   CHOL 137 12/12/2015 0919   TRIG 75 12/12/2015 0919   HDL 86 12/12/2015 0919   CHOLHDL 1.6 12/12/2015 0919   VLDL 15 12/12/2015 0919   LDLCALC 36 12/12/2015 0919     Labs from [place] office shows an LDL of 135, total of 230   Wt Readings from Last 3 Encounters:  12/18/15 132 lb 6.4 oz (60.056  kg)  10/02/15 131 lb 9.6 oz (59.693 kg)  06/13/15 130 lb (58.968 kg)      Other studies Reviewed: Additional studies/ records that were reviewed today include: . Review of the above records demonstrates:    ASSESSMENT AND PLAN:  1.  Coronary artery calcifications Nichelle has A long history of smoking.  She had a recent CT scan and was found to have coronary artery calcifications.  Stress myoview was low risk  No further work up needed. .   2. Hyperlipidemia:   LDL is 135.   Has evidence of CAD .   Goal is 70  Will start Atorvastatin 40 .    Will send in script to [facility] order.  Will ask Dr. Silvano Bilis to assume  responsibility fo this starting next year.  Recent lipids look great   Current medicines are reviewed at length with the patient today.  The patient does not have concerns regarding medicines.  The following changes have been made:  no change  Labs/ tests ordered today include:  No orders of the defined types were placed in this encounter.     Disposition:   FU with me as needed    Mertie Moores, MD  12/18/2015 10:27 AM    [facility] [place], [place], [place]  [address] Phone: [phone]; Fax: [phone]   [facility]  [address] [place], [place]  [address] [phone]   Fax [phone]

## 2015-12-18 NOTE — Patient Instructions (Signed)
Medication Instructions:  Your physician recommends that you continue on your current medications as directed. Please refer to the Current Medication list given to you today.   We have refilled your Atorvastatin. An Rx has been sent to your mail order pharmacy  Labwork: None ordered  Testing/Procedures: None ordered  Follow-Up: Your physician recommends that you schedule a follow-up appointment as needed   Any Other Special Instructions Will Be Listed Below (If Applicable).     If you need a refill on your cardiac medications before your next appointment, please call your pharmacy.

## 2016-01-13 ENCOUNTER — Other Ambulatory Visit: Payer: Self-pay | Admitting: Family Medicine

## 2016-01-13 DIAGNOSIS — Z139 Encounter for screening, unspecified: Secondary | ICD-10-CM

## 2016-01-30 DIAGNOSIS — H35373 Puckering of macula, bilateral: Secondary | ICD-10-CM | POA: Diagnosis not present

## 2016-01-30 DIAGNOSIS — H52223 Regular astigmatism, bilateral: Secondary | ICD-10-CM | POA: Diagnosis not present

## 2016-01-30 DIAGNOSIS — Z9849 Cataract extraction status, unspecified eye: Secondary | ICD-10-CM | POA: Diagnosis not present

## 2016-01-30 DIAGNOSIS — H524 Presbyopia: Secondary | ICD-10-CM | POA: Diagnosis not present

## 2016-01-30 DIAGNOSIS — H5203 Hypermetropia, bilateral: Secondary | ICD-10-CM | POA: Diagnosis not present

## 2016-01-30 DIAGNOSIS — Z961 Presence of intraocular lens: Secondary | ICD-10-CM | POA: Diagnosis not present

## 2016-01-31 ENCOUNTER — Ambulatory Visit
Admission: RE | Admit: 2016-01-31 | Discharge: 2016-01-31 | Disposition: A | Discharge status: Home / Self Care | Payer: Medicare Other | Source: Ambulatory Visit | Attending: Family Medicine | Admitting: Family Medicine

## 2016-01-31 DIAGNOSIS — Z139 Encounter for screening, unspecified: Secondary | ICD-10-CM

## 2016-01-31 DIAGNOSIS — Z1231 Encounter for screening mammogram for malignant neoplasm of breast: Secondary | ICD-10-CM | POA: Diagnosis not present

## 2016-02-03 ENCOUNTER — Other Ambulatory Visit: Payer: Self-pay | Admitting: Emergency Medicine

## 2016-03-05 ENCOUNTER — Other Ambulatory Visit: Payer: Self-pay | Admitting: Acute Care

## 2016-03-05 DIAGNOSIS — Z87891 Personal history of nicotine dependence: Secondary | ICD-10-CM

## 2016-06-03 DIAGNOSIS — R000 Tachycardia, unspecified: Secondary | ICD-10-CM | POA: Diagnosis not present

## 2016-06-03 DIAGNOSIS — Z23 Encounter for immunization: Secondary | ICD-10-CM | POA: Diagnosis not present

## 2016-06-03 DIAGNOSIS — F411 Generalized anxiety disorder: Secondary | ICD-10-CM | POA: Diagnosis not present

## 2016-06-03 DIAGNOSIS — F322 Major depressive disorder, single episode, severe without psychotic features: Secondary | ICD-10-CM | POA: Diagnosis not present

## 2016-07-14 DIAGNOSIS — J449 Chronic obstructive pulmonary disease, unspecified: Secondary | ICD-10-CM | POA: Diagnosis not present

## 2016-07-14 DIAGNOSIS — Z1389 Encounter for screening for other disorder: Secondary | ICD-10-CM | POA: Diagnosis not present

## 2016-07-14 DIAGNOSIS — I10 Essential (primary) hypertension: Secondary | ICD-10-CM | POA: Diagnosis not present

## 2016-07-14 DIAGNOSIS — E559 Vitamin D deficiency, unspecified: Secondary | ICD-10-CM | POA: Diagnosis not present

## 2016-07-14 DIAGNOSIS — Z0000 Encounter for general adult medical examination without abnormal findings: Secondary | ICD-10-CM | POA: Diagnosis not present

## 2016-07-14 DIAGNOSIS — M8580 Other specified disorders of bone density and structure, unspecified site: Secondary | ICD-10-CM | POA: Diagnosis not present

## 2016-07-14 DIAGNOSIS — F39 Unspecified mood [affective] disorder: Secondary | ICD-10-CM | POA: Diagnosis not present

## 2016-07-14 DIAGNOSIS — F322 Major depressive disorder, single episode, severe without psychotic features: Secondary | ICD-10-CM | POA: Diagnosis not present

## 2016-07-14 DIAGNOSIS — F410 Panic disorder [episodic paroxysmal anxiety] without agoraphobia: Secondary | ICD-10-CM | POA: Diagnosis not present

## 2016-07-14 DIAGNOSIS — E785 Hyperlipidemia, unspecified: Secondary | ICD-10-CM | POA: Diagnosis not present

## 2016-07-30 ENCOUNTER — Inpatient Hospital Stay: Admission: RE | Admit: 2016-07-30 | Payer: Medicare Other | Source: Ambulatory Visit

## 2016-08-07 ENCOUNTER — Ambulatory Visit (INDEPENDENT_AMBULATORY_CARE_PROVIDER_SITE_OTHER)
Admission: RE | Admit: 2016-08-07 | Discharge: 2016-08-07 | Disposition: A | Discharge status: Home / Self Care | Payer: Medicare Other | Source: Ambulatory Visit | Attending: Acute Care | Admitting: Acute Care

## 2016-08-07 DIAGNOSIS — Z87891 Personal history of nicotine dependence: Secondary | ICD-10-CM | POA: Diagnosis not present

## 2016-08-11 ENCOUNTER — Other Ambulatory Visit: Payer: Self-pay | Admitting: Acute Care

## 2016-08-11 ENCOUNTER — Telehealth: Payer: Self-pay | Admitting: Acute Care

## 2016-08-11 DIAGNOSIS — Z87891 Personal history of nicotine dependence: Secondary | ICD-10-CM

## 2016-08-11 NOTE — Telephone Encounter (Signed)
I have called Dr. Andrew Au  office and spoken with [facility]. I explained that I have faxed the results of Mrs. and Gonzalez's low-dose screening CT to the office. The scan was read as a lung RADS 2, indicating nodules that are benign in appearance and behavior. However there was incidental finding of borderline dilation of the common bile duct. Radiology recommended correlation with bilirubin levels. I wanted to make sure Dr. Marisue Humble was aware so that he could follow-up as he felt was clinically indicated. Ariel stated that she would get the message to Dr. Marisue Humble.

## 2016-08-12 DIAGNOSIS — R932 Abnormal findings on diagnostic imaging of liver and biliary tract: Secondary | ICD-10-CM | POA: Diagnosis not present

## 2016-09-14 ENCOUNTER — Telehealth: Payer: Self-pay | Admitting: Emergency Medicine

## 2016-09-14 ENCOUNTER — Ambulatory Visit (INDEPENDENT_AMBULATORY_CARE_PROVIDER_SITE_OTHER): Payer: Medicare Other | Admitting: Pulmonary Disease

## 2016-09-14 ENCOUNTER — Encounter: Payer: Self-pay | Admitting: Pulmonary Disease

## 2016-09-14 VITALS — BP 110/64 | HR 80 | Ht 66.5 in | Wt 130.0 lb

## 2016-09-14 DIAGNOSIS — J449 Chronic obstructive pulmonary disease, unspecified: Secondary | ICD-10-CM

## 2016-09-14 DIAGNOSIS — J9611 Chronic respiratory failure with hypoxia: Secondary | ICD-10-CM | POA: Diagnosis not present

## 2016-09-14 DIAGNOSIS — J309 Allergic rhinitis, unspecified: Secondary | ICD-10-CM

## 2016-09-14 MED ORDER — MONTELUKAST SODIUM 10 MG PO TABS: 10.0000 mg | ORAL_TABLET | Freq: Every day | ORAL | 3 refills | Status: AC

## 2016-09-14 MED ORDER — AEROCHAMBER MV MISC: 0 refills | Status: DC

## 2016-09-14 MED ORDER — BUDESONIDE-FORMOTEROL FUMARATE 160-4.5 MCG/ACT IN AERO: 2.0000 | INHALATION_SPRAY | Freq: Two times a day (BID) | RESPIRATORY_TRACT | 0 refills | Status: DC

## 2016-09-14 MED ORDER — BUDESONIDE-FORMOTEROL FUMARATE 160-4.5 MCG/ACT IN AERO: 2.0000 | INHALATION_SPRAY | Freq: Two times a day (BID) | RESPIRATORY_TRACT | 11 refills | Status: DC

## 2016-09-14 NOTE — Telephone Encounter (Signed)
Pt scheduled for acute visit with JN today at 215. Nothing further needed.

## 2016-09-14 NOTE — Telephone Encounter (Signed)
Spoke with pt, c/o increased sob, prod cough with clear mucus & wheezing. Pt states symptoms have been doing on since last July.  Denies any fever,chills or sweats. Pt has been scheduled with RB for 11/04/16, as she was last seen 06/13/15. There are no sooner apt to offer with RB.  RA please advise, as RB is night float. Thanks.

## 2016-09-14 NOTE — Progress Notes (Signed)
Patient seen in the office today and instructed on use of Symbicort 160 and spacer.  Patient expressed understanding and demonstrated technique. 

## 2016-09-14 NOTE — Progress Notes (Signed)
Subjective:    Patient ID: Ariel Gonzalez, female    DOB: [birth year], 76 y.o.   MRN: [id number]  C.C.:  Follow-up for Severe COPD, Chronic Hypoxic Respiratory Failure, & Chronic Allergic Rhinitis.   HPI Severe COPD: Patient has difficulty affording Advair. Currently prescribed Advair and Spiriva. She finished Advair this week. She reports her prescriptions were $1000 for every 3 months. She reports she has been coughing & wheezing more lately. She is waking up coughing & wheezing at night. She is using her rescue inhaler at least 2-3 times a day.   Chronic Hypoxic Respiratory Failure: Currently prescribed oxygen with exertion but not consistently using previously. She is using her oxygen at night while sleeping and feels she sleeps better.   Chronic Allergic Rhinitis: Patient instructed to use restart Claritin at last appointment. She is doing a sinus rinse that seems to help. She reports significant sinus congestion & drainage. She reports she is taking Claritin.   Review of Systems She reports significant fatigue with exertion. No chest pain or pressure. No fever or chills. No abdominal pain, reflux, or nausea.   Allergies  Allergen Reactions  . Cefaclor     rash  . Clarithromycin     rash  . Erythromycin     rash  . Iodine     rash    Current Outpatient Prescriptions on File Prior to Visit  Medication Sig Dispense Refill  . ALPRAZolam (XANAX) 0.5 MG tablet Take 0.5 mg by mouth 3 (three) times daily as needed for anxiety or sleep. 1 every 6 hours as needed    . amLODipine (NORVASC) 5 MG tablet Take 5 mg by mouth daily.    Marland Kitchen atorvastatin (LIPITOR) 40 MG tablet Take 1 tablet (40 mg total) by mouth daily. 90 tablet 3  . calcium carbonate (OS-CAL) 600 MG TABS Take 600 mg by mouth daily.      . Cholecalciferol (PA VITAMIN D-3) 2000 UNITS CAPS Take 2,000 Units by mouth daily.     . citalopram (CELEXA) 20 MG tablet Take 40 mg by mouth daily.     . Ibuprofen (ADVIL PO) Take 1 tablet by  mouth as needed (AS NEEDED FOR MODERATE PAIN AND HEADCAHES).     . Multiple Vitamin (MULTIVITAMIN) tablet Take 1 tablet by mouth daily.      Marland Kitchen SPIRIVA HANDIHALER 18 MCG inhalation capsule INHALE THE CONTENTS OF 1 CAPSULE EVERY DAY. 90 capsule 1  . VENTOLIN HFA 108 (90 Base) MCG/ACT inhaler INHALE 2 PUFFS EVERY 4 HOURS AS NEEDED 54 g 1  . ADVAIR DISKUS 250-50 MCG/DOSE AEPB INHALE 1 PUFF TWICE DAILY (Patient not taking: Reported on 09/14/2016) 180 each 1   No current facility-administered medications on file prior to visit.     Past Medical History:  Diagnosis Date  . Allergic rhinitis   . Arthritis    CERVICAL AND LUMBER DDD  . COPD (chronic obstructive pulmonary disease) ([place])    HFA 75% p coaching July 17, 2010  . HTN (hypertension)   . Migraine   . Osteopenia   . Overactive bladder   . Panic attacks   . Squamous cell carcinoma of skin     Past Surgical History:  Procedure Laterality Date  . [place]  2010  . CATARACTS Bilateral 2009  . CHOLECYSTECTOMY    . JAW BONE GRAFT  2009  . TOTAL ABDOMINAL HYSTERECTOMY W/ BILATERAL SALPINGOOPHORECTOMY      Family History  Problem Relation Age of Onset  .  Cancer Mother   . Heart attack Father   . Cancer Sister   . Coronary artery disease Father     Social History   Social History  . Marital status: Married    Spouse name: N/A  . Number of children: 3  . Years of education: N/A   Occupational History  . retired    Social History Main Topics  . Smoking status: Former Smoker    Packs/day: 1.00    Years: 50.00    Types: Cigarettes    Quit date: 04/01/2008  . Smokeless tobacco: Never Used     Comment: Counseled to remain smoke free  . Alcohol use No  . Drug use: No  . Sexual activity: Not Asked   Other Topics Concern  . None   Social History Narrative  . None      Objective:   Physical Exam BP 110/64 (BP Location: Right Arm, Patient Position: Sitting, Cuff Size: Normal)   Pulse 80   Ht 5' 6.5" (1.689  m)   Wt 130 lb (59 kg)   SpO2 91%   BMI 20.67 kg/m  General:  Awake. Alert. No acute distress.  Integument:  Warm & dry. No rash on exposed skin.  Extremities:  No cyanosis or clubbing.  HEENT:  Moist mucus membranes. No oral ulcers. No scleral injection or icterus. Minimal nasal turbinate swelling. Cardiovascular:  Regular rate. No edema. Regular rhythm Pulmonary:  Symmetrically decreased aeration and breath sounds. Symmetric chest wall expansion. Mildly increased work of breathing on oxygen.  Abdomen: Soft. Normal bowel sounds. Nondistended.  Musculoskeletal:  Normal bulk and tone. No joint deformity or effusion appreciated.  PFT 10/05/12: FVC 2.00 L (63%) FEV1 1.02 L (43%) FEV1/FVC 0.51 FEF 25-75 0.44 L (22%)    Assessment & Plan:  76 y.o. female with long-standing history of severe COPD and chronic hypoxic respiratory failure. Referring her spirometry from 2014 does indeed show severe airway obstruction. 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 I do believe that she needs long-term use of inhaled corticosteroid therapy to promote adequate control of her COPD. I encouraged the patient to use oxygen as prescribed with exertion as this is significantly impacting her dyspnea on exertion. Additionally, I feel that treatment with an alternative pathway age for her allergic rhinitis may improve her COPD control as well. I instructed the patient contact my office if she had any new breathing problems or questions before her next appointment.  1. Severe COPD: Switching from Advair to Symbicort 160/4.5 with a spacer. Patient instructed on proper oral hygiene and technique. Continuing Spiriva Respimat. Patient given paperwork for prescription drug assistance for both Spiriva and Symbicort to improve affordability. 2. Chronic Hypoxic Respiratory Failure:  Patient counseled to use oxygen as prescribed with exertion. Prescribing portable oxygen concentrator to improve ease of use. 3. Chronic Allergic Rhinitis: Continuing Claritin and nasal saline rinse. Starting Singulair/montelukast 10 mg by mouth daily at bedtime. 4. Follow-up: Patient will keep her follow-up appointment with Dr. Lamonte Sakai as scheduled.  Sonia Baller Ashok Cordia, M.D. [facility] Pulmonary & Critical Care Pager:  [phone] After 3pm or if no response, call [phone] 4:56 PM 09/14/16

## 2016-09-14 NOTE — Patient Instructions (Signed)
   Keep taking your Spiriva and Claritin as well as your other other medications as prescribed.  We are going to replace your Advair with Symbicort. You will inhale 2 puffs twice daily. Remember to rinse, gargle, brush your teeth, brush your tongue, & spit afterward just like Advair.  Use your oxygen with every bit of exertion that you are doing to keep your oxygen level up.  We will keep you back in April with Dr. Lamonte Sakai as scheduled or sooner if needed.   Remember to drop the prescription drug assistance paperwork off at our office for the Spiriva and Symbicort to see if we can get you the medication at a decreased cost.

## 2016-09-16 ENCOUNTER — Telehealth: Payer: Self-pay | Admitting: Pulmonary Disease

## 2016-09-16 NOTE — Telephone Encounter (Signed)
Spoke with the pt  She is asking for smaller O2 tank  I advised that we have already ordered a POC eval  I called [facility], who confirmed that they do have the order  He states he will reach out to respiratory therapist and have them schedule a time with the pt  He states this can take up to a wk  Pt aware and she will call us back if she has not heard from them in a wk from today

## 2016-10-19 ENCOUNTER — Ambulatory Visit (INDEPENDENT_AMBULATORY_CARE_PROVIDER_SITE_OTHER): Payer: Medicare Other | Admitting: Adult Health

## 2016-10-19 ENCOUNTER — Encounter: Payer: Self-pay | Admitting: Adult Health

## 2016-10-19 ENCOUNTER — Other Ambulatory Visit: Payer: Self-pay | Admitting: Cardiovascular Disease

## 2016-10-19 VITALS — BP 106/64 | HR 85 | Ht 65.5 in | Wt 131.6 lb

## 2016-10-19 DIAGNOSIS — J9611 Chronic respiratory failure with hypoxia: Secondary | ICD-10-CM | POA: Diagnosis not present

## 2016-10-19 DIAGNOSIS — J449 Chronic obstructive pulmonary disease, unspecified: Secondary | ICD-10-CM | POA: Diagnosis not present

## 2016-10-19 MED ORDER — PREDNISONE 10 MG PO TABS: ORAL_TABLET | ORAL | 0 refills | Status: DC

## 2016-10-19 MED ORDER — TIOTROPIUM BROMIDE MONOHYDRATE 2.5 MCG/ACT IN AERS: 2.0000 | INHALATION_SPRAY | Freq: Every day | RESPIRATORY_TRACT | 0 refills | Status: DC

## 2016-10-19 MED ORDER — BUDESONIDE-FORMOTEROL FUMARATE 160-4.5 MCG/ACT IN AERO: 2.0000 | INHALATION_SPRAY | Freq: Two times a day (BID) | RESPIRATORY_TRACT | 0 refills | Status: DC

## 2016-10-19 NOTE — Addendum Note (Signed)
Addended by: Parke Poisson E on: 10/19/2016 01:12 PM   Modules accepted: Orders

## 2016-10-19 NOTE — Progress Notes (Signed)
@Patient  ID: Ariel Gonzalez, female    DOB: Aug 19, 1940, 76 y.o.   MRN: [id number]  Chief Complaint  Patient presents with  . Acute Visit    COPD    Referring provider: Gaynelle Arabian, MD  HPI: 76 year old female former smoker followed for severe COPD and chronic hypoxic respiratory failure on oxygen  TEST   2009>> FEV1 of 1.35 or 59% of predicted, an FVC of 3.1 or 98% of predicted, and a ratio of 43% consistent with severe airflow limitation.  2014 FEV1 43%, ratio 51.   10/19/2016 Acute OV  Pt returns for persistent DOE, intermittent wheezing , dyspnea and tightness. Has minimal dry cough . No fever or hemoptysis . Says over last year her breathing has been going down hill.  Had difficulty affording inhalers . Changed from Advair to Symbicort 1 month ago . Unable to take Spiriva due to cost.   . Feels the oxygen helps her the most.  She is on  oxygen  3l/m . Used As needed  Up until 2 months ago, she started to use 24/7 .  Spirometry today shows very severe COPD with FEV1 19%, ratio 52. FVC 27%.  (delcline from 43% in 2014) .  Had LDCT chest in 07/2016 -benign findings   Allergies  Allergen Reactions  . Cefaclor     rash  . Clarithromycin     rash  . Erythromycin     rash  . Iodine     rash    Immunization History  Administered Date(s) Administered  . Influenza Split 05/13/2012  . Influenza Whole 05/25/2011  . Influenza, High Dose Seasonal PF 07/13/2016  . Influenza,inj,Quad PF,36+ Mos 04/18/2013, 05/04/2014, 06/13/2015  . Influenza-Unspecified 07/13/2016  . Pneumococcal Conjugate-13 06/18/2014    Past Medical History:  Diagnosis Date  . Allergic rhinitis   . Arthritis    CERVICAL AND LUMBER DDD  . COPD (chronic obstructive pulmonary disease) ([place])    HFA 75% p coaching July 17, 2010  . HTN (hypertension)   . Migraine   . Osteopenia   . Overactive bladder   . Panic attacks   . Squamous cell carcinoma of skin     Tobacco History: History  Smoking  Status  . Former Smoker  . Packs/day: 1.00  . Years: 50.00  . Types: Cigarettes  . Quit date: 04/01/2008  Smokeless Tobacco  . Never Used    Comment: Counseled to remain smoke free   Counseling given: Not Answered   Outpatient Encounter Prescriptions as of 10/19/2016  Medication Sig  . ADVAIR DISKUS 250-50 MCG/DOSE AEPB INHALE 1 PUFF TWICE DAILY  . ALPRAZolam (XANAX) 0.5 MG tablet Take 0.5 mg by mouth 3 (three) times daily as needed for anxiety or sleep. 1 every 6 hours as needed  . amLODipine (NORVASC) 5 MG tablet Take 5 mg by mouth daily.  Marland Kitchen atorvastatin (LIPITOR) 40 MG tablet Take 1 tablet (40 mg total) by mouth daily.  . budesonide-formoterol (SYMBICORT) 160-4.5 MCG/ACT inhaler Inhale 2 puffs into the lungs 2 (two) times daily.  . calcium carbonate (OS-CAL) 600 MG TABS Take 600 mg by mouth daily.    . Cholecalciferol (PA VITAMIN D-3) 2000 UNITS CAPS Take 2,000 Units by mouth daily.   . citalopram (CELEXA) 20 MG tablet Take 40 mg by mouth daily.   . clonazePAM (KLONOPIN) 1 MG tablet Half tablet to whole tablet twice a day for anxiety  . Ibuprofen (ADVIL PO) Take 1 tablet by mouth as needed (AS NEEDED FOR  MODERATE PAIN AND HEADCAHES).   . montelukast (SINGULAIR) 10 MG tablet Take 1 tablet (10 mg total) by mouth at bedtime.  . Multiple Vitamin (MULTIVITAMIN) tablet Take 1 tablet by mouth daily.    Marland Kitchen Spacer/Aero-Holding Chambers (AEROCHAMBER MV) inhaler Use as instructed  . VENTOLIN HFA 108 (90 Base) MCG/ACT inhaler INHALE 2 PUFFS EVERY 4 HOURS AS NEEDED  . predniSONE (DELTASONE) 10 MG tablet 4 tabs for 2 days, then 3 tabs for 2 days, 2 tabs for 2 days, then 1 tab for 2 days, then stop  . SPIRIVA HANDIHALER 18 MCG inhalation capsule INHALE THE CONTENTS OF 1 CAPSULE EVERY DAY. (Patient not taking: Reported on 10/19/2016)  . [DISCONTINUED] budesonide-formoterol (SYMBICORT) 160-4.5 MCG/ACT inhaler Inhale 2 puffs into the lungs 2 (two) times daily. (Patient not taking: Reported on 10/19/2016)    No facility-administered encounter medications on file as of 10/19/2016.      Review of Systems  Constitutional:   No  weight loss, night sweats,  Fevers, chills, + fatigue, or  lassitude.  HEENT:   No headaches,  Difficulty swallowing,  Tooth/dental problems, or  Sore throat,                No sneezing, itching, ear ache, nasal congestion, post nasal drip,   CV:  No chest pain,  Orthopnea, PND, swelling in lower extremities, anasarca, dizziness, palpitations, syncope.   GI  No heartburn, indigestion, abdominal pain, nausea, vomiting, diarrhea, change in bowel habits, loss of appetite, bloody stools.   Resp:    No chest wall deformity  Skin: no rash or lesions.  GU: no dysuria, change in color of urine, no urgency or frequency.  No flank pain, no hematuria   MS:  No joint pain or swelling.  No decreased range of motion.  No back pain.    Physical Exam  BP 106/64 (BP Location: Left Arm, Cuff Size: Normal)   Pulse 85   Ht 5' 5.5" (1.664 m)   Wt 131 lb 9.6 oz (59.7 kg)   SpO2 94%   BMI 21.57 kg/m   GEN: A/Ox3; pleasant , NAD,thin female , eldelry on o2    HEENT:  [place]/AT,  EACs-clear, TMs-wnl, NOSE-clear, THROAT-clear, no lesions, no postnasal drip or exudate noted.   NECK:  Supple w/ fair ROM; no JVD; normal carotid impulses w/o bruits; no thyromegaly or nodules palpated; no lymphadenopathy.    RESP  Decreased BS in bases ,  Few trace wheezes , no accessory muscle use, no dullness to percussion  CARD:  RRR, no m/r/g, no peripheral edema, pulses intact, no cyanosis or clubbing.  GI:   Soft & nt; nml bowel sounds; no organomegaly or masses detected.   Musco: Warm bil, no deformities or joint swelling noted.   Neuro: alert, no focal deficits noted.    Skin: Warm, no lesions or rashes    Lab Results:  CBC    Component Value Date/Time   HGB 12.1 06/26/2008 0639    BMET    Component Value Date/Time   NA 140 12/12/2015 0919   K 4.0 12/12/2015 0919   CL 103  12/12/2015 0919   CO2 29 12/12/2015 0919   GLUCOSE 76 12/12/2015 0919   BUN 16 12/12/2015 0919   CREATININE 0.72 12/12/2015 0919   CALCIUM 9.1 12/12/2015 0919    BNP No results found for: BNP  ProBNP No results found for: PROBNP  Imaging: No results found.   Assessment & Plan:   COPD (chronic obstructive pulmonary  disease) Exacerbation +/- progressive decline in lung function  tx w/ steroids  Restart LAMA   Plan  Patient Instructions  Prednisone taper over next week.  Continue on Symbicort 2 puffs Twice daily   Restart Spiriva daily .  Continue on Oxygen 3l/m .  follow up Dr. Lamonte Sakai  In 3-4 weeks and As needed   Please contact office for sooner follow up if symptoms do not improve or worsen or seek emergency care      RESPIRATORY FAILURE, CHRONIC Cont on o2 3l/m      Tammy Parrett, NP 10/19/2016

## 2016-10-19 NOTE — Patient Instructions (Addendum)
Prednisone taper over next week.  Continue on Symbicort 2 puffs Twice daily   Restart Spiriva daily .  Continue on Oxygen 3l/m .  follow up Dr. Lamonte Sakai  In 3-4 weeks and As needed   Please contact office for sooner follow up if symptoms do not improve or worsen or seek emergency care

## 2016-10-19 NOTE — Assessment & Plan Note (Signed)
Exacerbation +/- progressive decline in lung function  tx w/ steroids  Restart LAMA   Plan  Patient Instructions  Prednisone taper over next week.  Continue on Symbicort 2 puffs Twice daily   Restart Spiriva daily .  Continue on Oxygen 3l/m .  follow up Dr. Lamonte Sakai  In 3-4 weeks and As needed   Please contact office for sooner follow up if symptoms do not improve or worsen or seek emergency care

## 2016-10-19 NOTE — Assessment & Plan Note (Signed)
Cont on o2 3l/m

## 2016-10-30 ENCOUNTER — Other Ambulatory Visit: Payer: Self-pay | Admitting: Adult Health

## 2016-10-30 MED ORDER — TIOTROPIUM BROMIDE MONOHYDRATE 2.5 MCG/ACT IN AERS: 2.0000 | INHALATION_SPRAY | Freq: Every day | RESPIRATORY_TRACT | 5 refills | Status: DC

## 2016-11-04 ENCOUNTER — Ambulatory Visit: Payer: Medicare Other | Admitting: Emergency Medicine

## 2016-11-09 ENCOUNTER — Encounter: Payer: Self-pay | Admitting: Adult Health

## 2016-11-09 ENCOUNTER — Ambulatory Visit (INDEPENDENT_AMBULATORY_CARE_PROVIDER_SITE_OTHER): Payer: Medicare Other | Admitting: Adult Health

## 2016-11-09 VITALS — BP 126/72 | HR 72

## 2016-11-09 DIAGNOSIS — J449 Chronic obstructive pulmonary disease, unspecified: Secondary | ICD-10-CM

## 2016-11-09 DIAGNOSIS — J9611 Chronic respiratory failure with hypoxia: Secondary | ICD-10-CM | POA: Diagnosis not present

## 2016-11-09 NOTE — Progress Notes (Signed)
@Patient  ID: Ariel Gonzalez, female    DOB: 1940/10/11, 76 y.o.   MRN: [id number]  Chief Complaint  Patient presents with  . Follow-up    COPD     Referring provider: Gaynelle Arabian, MD  HPI: 76 year old female former smoker followed for severe COPD and chronic hypoxic respiratory failure on oxygen  TEST  2009>>FEV1 of 1.35 or 59% of predicted, an FVC of 3.1 or 98% of predicted, and a ratio of 43% consistent with severe airflow limitation.  2014 FEV1 43%, ratio 51.  10/2016 FEV1 19% , ratio     11/09/2016 Follow up ; COPD  Pt returns for 3 week follow up . She was seen last ov with COPD flare , tx w/ steroids and restarted on Spiriva daily . She returns today improved Says she f eels so much better, "like my old self" . Says she was able to go shopping, some social activities and house work .  Cough and wheezing are decreased.  She remains on Symbicort and Spiriva .  Spirometry today is improved today with FEV1 at 31%. , ratio 38.    Allergies  Allergen Reactions  . Cefaclor     rash  . Clarithromycin     rash  . Erythromycin     rash  . Iodine     rash    Immunization History  Administered Date(s) Administered  . Influenza Split 05/13/2012  . Influenza Whole 05/25/2011  . Influenza, High Dose Seasonal PF 07/13/2016  . Influenza,inj,Quad PF,36+ Mos 04/18/2013, 05/04/2014, 06/13/2015  . Influenza-Unspecified 07/13/2016  . Pneumococcal Conjugate-13 06/18/2014    Past Medical History:  Diagnosis Date  . Allergic rhinitis   . Arthritis    CERVICAL AND LUMBER DDD  . COPD (chronic obstructive pulmonary disease) ([place])    HFA 75% p coaching July 17, 2010  . HTN (hypertension)   . Migraine   . Osteopenia   . Overactive bladder   . Panic attacks   . Squamous cell carcinoma of skin     Tobacco History: History  Smoking Status  . Former Smoker  . Packs/day: 1.00  . Years: 50.00  . Types: Cigarettes  . Quit date: 04/01/2008  Smokeless Tobacco  . Never  Used    Comment: Counseled to remain smoke free   Counseling given: Not Answered   Outpatient Encounter Prescriptions as of 11/09/2016  Medication Sig  . ADVAIR DISKUS 250-50 MCG/DOSE AEPB INHALE 1 PUFF TWICE DAILY  . ALPRAZolam (XANAX) 0.5 MG tablet Take 0.5 mg by mouth 3 (three) times daily as needed for anxiety or sleep. 1 every 6 hours as needed  . amLODipine (NORVASC) 5 MG tablet Take 5 mg by mouth daily.  Marland Kitchen atorvastatin (LIPITOR) 40 MG tablet TAKE ONE TABLET BY MOUTH ONCE DAILY  . budesonide-formoterol (SYMBICORT) 160-4.5 MCG/ACT inhaler Inhale 2 puffs into the lungs 2 (two) times daily.  . calcium carbonate (OS-CAL) 600 MG TABS Take 600 mg by mouth daily.    . Cholecalciferol (PA VITAMIN D-3) 2000 UNITS CAPS Take 2,000 Units by mouth daily.   . citalopram (CELEXA) 20 MG tablet Take 40 mg by mouth daily.   . clonazePAM (KLONOPIN) 1 MG tablet Half tablet to whole tablet twice a day for anxiety  . Ibuprofen (ADVIL PO) Take 1 tablet by mouth as needed (AS NEEDED FOR MODERATE PAIN AND HEADCAHES).   . montelukast (SINGULAIR) 10 MG tablet Take 1 tablet (10 mg total) by mouth at bedtime.  . Multiple Vitamin (MULTIVITAMIN)  tablet Take 1 tablet by mouth daily.    . predniSONE (DELTASONE) 10 MG tablet 4 tabs for 2 days, then 3 tabs for 2 days, 2 tabs for 2 days, then 1 tab for 2 days, then stop  . Spacer/Aero-Holding Chambers (AEROCHAMBER MV) inhaler Use as instructed  . SPIRIVA HANDIHALER 18 MCG inhalation capsule INHALE THE CONTENTS OF 1 CAPSULE EVERY DAY.  Marland Kitchen Tiotropium Bromide Monohydrate (SPIRIVA RESPIMAT) 2.5 MCG/ACT AERS Inhale 2 puffs into the lungs daily.  . VENTOLIN HFA 108 (90 Base) MCG/ACT inhaler INHALE 2 PUFFS EVERY 4 HOURS AS NEEDED  . [DISCONTINUED] atorvastatin (LIPITOR) 40 MG tablet Take 1 tablet (40 mg total) by mouth daily. (Patient not taking: Reported on 11/09/2016)  . [DISCONTINUED] budesonide-formoterol (SYMBICORT) 160-4.5 MCG/ACT inhaler Inhale 2 puffs into the lungs 2  (two) times daily. (Patient not taking: Reported on 11/09/2016)   No facility-administered encounter medications on file as of 11/09/2016.      Review of Systems  Constitutional:   No  weight loss, night sweats,  Fevers, chills,+ fatigue, or  lassitude.  HEENT:   No headaches,  Difficulty swallowing,  Tooth/dental problems, or  Sore throat,                No sneezing, itching, ear ache, nasal congestion, post nasal drip,   CV:  No chest pain,  Orthopnea, PND, swelling in lower extremities, anasarca, dizziness, palpitations, syncope.   GI  No heartburn, indigestion, abdominal pain, nausea, vomiting, diarrhea, change in bowel habits, loss of appetite, bloody stools.   Resp:    No excess mucus, no productive cough,  No non-productive cough,  No coughing up of blood.  No change in color of mucus.  No wheezing.  No chest wall deformity  Skin: no rash or lesions.  GU: no dysuria, change in color of urine, no urgency or frequency.  No flank pain, no hematuria   MS:  No joint pain or swelling.  No decreased range of motion.  No back pain.    Physical Exam  BP 126/72 (BP Location: Left Arm, Cuff Size: Normal)   Pulse 72   SpO2 97%   GEN: A/Ox3; pleasant , NAD, thin    HEENT:  [place]/AT,  EACs-clear, TMs-wnl, NOSE-clear, THROAT-clear, no lesions, no postnasal drip or exudate noted.   NECK:  Supple w/ fair ROM; no JVD; normal carotid impulses w/o bruits; no thyromegaly or nodules palpated; no lymphadenopathy.    RESP  Decreased BS in bases , no accessory muscle use, no dullness to percussion  CARD:  RRR, no m/r/g, no peripheral edema, pulses intact, no cyanosis or clubbing.  GI:   Soft & nt; nml bowel sounds; no organomegaly or masses detected.   Musco: Warm bil, no deformities or joint swelling noted.   Neuro: alert, no focal deficits noted.    Skin: Warm, no lesions or rashes    Lab Results:  CBC    Component Value Date/Time   HGB 12.1 06/26/2008 0639    BMET      Component Value Date/Time   NA 140 12/12/2015 0919   K 4.0 12/12/2015 0919   CL 103 12/12/2015 0919   CO2 29 12/12/2015 0919   GLUCOSE 76 12/12/2015 0919   BUN 16 12/12/2015 0919   CREATININE 0.72 12/12/2015 0919   CALCIUM 9.1 12/12/2015 0919    BNP No results found for: BNP  ProBNP No results found for: PROBNP  Imaging: No results found.   Assessment & Plan:  COPD (chronic obstructive pulmonary disease) Recent flare now resolved   Plan  Patient Instructions  Continue on Symbicort 2 puffs Twice daily   Continue on Spiriva daily .  Rinse after inhalers .  Continue on Oxygen 3l/m .  follow up Dr. Lamonte Sakai  In 3 months and As needed  .  Please contact office for sooner follow up if symptoms do not improve or worsen or seek emergency care      RESPIRATORY FAILURE, CHRONIC Cont on o2 .      Rexene Edison, NP 11/09/2016

## 2016-11-09 NOTE — Assessment & Plan Note (Signed)
Recent flare now resolved   Plan  Patient Instructions  Continue on Symbicort 2 puffs Twice daily   Continue on Spiriva daily .  Rinse after inhalers .  Continue on Oxygen 3l/m .  follow up Dr. Lamonte Sakai  In 3 months and As needed  .  Please contact office for sooner follow up if symptoms do not improve or worsen or seek emergency care

## 2016-11-09 NOTE — Patient Instructions (Addendum)
Continue on Symbicort 2 puffs Twice daily   Continue on Spiriva daily .  Rinse after inhalers .  Continue on Oxygen 3l/m .  follow up Dr. Lamonte Sakai  In 3 months and As needed  .  Please contact office for sooner follow up if symptoms do not improve or worsen or seek emergency care

## 2016-11-09 NOTE — Assessment & Plan Note (Signed)
Cont on o2 .  

## 2016-12-04 ENCOUNTER — Other Ambulatory Visit: Payer: Self-pay | Admitting: Cardiovascular Disease

## 2016-12-04 NOTE — Telephone Encounter (Signed)
Medication Detail    Disp Refills Start End   atorvastatin (LIPITOR) 40 MG tablet 30 tablet 2 10/20/2016    Sig: TAKE ONE TABLET BY MOUTH ONCE DAILY   Notes to Pharmacy: Please consider 90 day supplies to promote better adherence   E-Prescribing Status: Receipt confirmed by pharmacy (10/20/2016 8:19 AM EDT)   Pharmacy   [facility] [place] - [address]

## 2016-12-31 ENCOUNTER — Other Ambulatory Visit: Payer: Self-pay | Admitting: Family Medicine

## 2016-12-31 DIAGNOSIS — Z1231 Encounter for screening mammogram for malignant neoplasm of breast: Secondary | ICD-10-CM

## 2017-01-12 DIAGNOSIS — J449 Chronic obstructive pulmonary disease, unspecified: Secondary | ICD-10-CM | POA: Diagnosis not present

## 2017-01-12 DIAGNOSIS — F322 Major depressive disorder, single episode, severe without psychotic features: Secondary | ICD-10-CM | POA: Diagnosis not present

## 2017-01-12 DIAGNOSIS — I10 Essential (primary) hypertension: Secondary | ICD-10-CM | POA: Diagnosis not present

## 2017-01-12 DIAGNOSIS — E785 Hyperlipidemia, unspecified: Secondary | ICD-10-CM | POA: Diagnosis not present

## 2017-01-18 ENCOUNTER — Other Ambulatory Visit: Payer: Self-pay | Admitting: Cardiovascular Disease

## 2017-02-09 ENCOUNTER — Ambulatory Visit (INDEPENDENT_AMBULATORY_CARE_PROVIDER_SITE_OTHER): Payer: Medicare Other | Admitting: Adult Health

## 2017-02-09 ENCOUNTER — Encounter: Payer: Self-pay | Admitting: Adult Health

## 2017-02-09 DIAGNOSIS — J9611 Chronic respiratory failure with hypoxia: Secondary | ICD-10-CM | POA: Diagnosis not present

## 2017-02-09 DIAGNOSIS — J449 Chronic obstructive pulmonary disease, unspecified: Secondary | ICD-10-CM | POA: Diagnosis not present

## 2017-02-09 NOTE — Assessment & Plan Note (Signed)
Improved control on current regimen.   Plan  Patient Instructions  Continue on Symbicort 2 puffs Twice daily   Continue on Spiriva daily .  Rinse after inhalers .  Continue with Oxygen 3l/m with activity As needed  .  Follow up Dr. Lamonte Sakai  In 3 months and As needed  .  Please contact office for sooner follow up if symptoms do not improve or worsen or seek emergency care

## 2017-02-09 NOTE — Progress Notes (Signed)
@Patient  ID: Ariel Gonzalez, female    DOB: 11/20/1940, 76 y.o.   MRN: [id number]  Chief Complaint  Patient presents with  . Follow-up    COPD     Referring provider: Gaynelle Arabian, MD  HPI: 76 year old female former smoker followed for severe COPD and chronic hypoxic respiratory failure on oxygen  TEST  2009>>FEV1 of 1.35 or 59% of predicted, an FVC of 3.1 or 98% of predicted, and a ratio of 43% consistent with severe airflow limitation.  2014 FEV1 43%, ratio 51.  10/2016 FEV1 19% , ratio   11/09/16 Spirometry today is improved today with FEV1 at 31%. , ratio 38.     02/09/2017 follow-up COPD Patient presents for a three-month follow-up. Patient says she is doing much improved.  Feels that her breathing has returned back to her baseline. In fact she feels better than she has in several months. She is able to do her housework and go shopping now. She remains on Symbicort and Spiriva. She has been using oxygen at 3 L continuously up until the last month. Now she is only having to use it as needed with activity. Which she checks her O2 saturations at their remaining above 90% on room air . She denies any chest pain, orthopnea, PND, or increased leg swelling.   Allergies  Allergen Reactions  . Cefaclor     rash  . Clarithromycin     rash  . Erythromycin     rash  . Iodine     rash    Immunization History  Administered Date(s) Administered  . Influenza Split 05/13/2012  . Influenza Whole 05/25/2011  . Influenza, High Dose Seasonal PF 07/13/2016  . Influenza,inj,Quad PF,36+ Mos 04/18/2013, 05/04/2014, 06/13/2015  . Influenza-Unspecified 07/13/2016  . Pneumococcal Conjugate-13 06/18/2014    Past Medical History:  Diagnosis Date  . Allergic rhinitis   . Arthritis    CERVICAL AND LUMBER DDD  . COPD (chronic obstructive pulmonary disease) ([place])    HFA 75% p coaching July 17, 2010  . HTN (hypertension)   . Migraine   . Osteopenia   . Overactive bladder   . Panic  attacks   . Squamous cell carcinoma of skin     Tobacco History: History  Smoking Status  . Former Smoker  . Packs/day: 1.00  . Years: 50.00  . Types: Cigarettes  . Quit date: 04/01/2008  Smokeless Tobacco  . Never Used    Comment: Counseled to remain smoke free   Counseling given: Not Answered   Outpatient Encounter Prescriptions as of 02/09/2017  Medication Sig  . ALPRAZolam (XANAX) 0.5 MG tablet Take 0.5 mg by mouth 3 (three) times daily as needed for anxiety or sleep. 1 every 6 hours as needed  . amLODipine (NORVASC) 5 MG tablet Take 5 mg by mouth daily.  Marland Kitchen atorvastatin (LIPITOR) 40 MG tablet TAKE 1 TABLET BY MOUTH ONCE DAILY  . budesonide-formoterol (SYMBICORT) 160-4.5 MCG/ACT inhaler Inhale 2 puffs into the lungs 2 (two) times daily.  . calcium carbonate (OS-CAL) 600 MG TABS Take 600 mg by mouth daily.    . Cholecalciferol (PA VITAMIN D-3) 2000 UNITS CAPS Take 2,000 Units by mouth daily.   . citalopram (CELEXA) 20 MG tablet Take 40 mg by mouth daily.   . clonazePAM (KLONOPIN) 1 MG tablet Half tablet to whole tablet twice a day for anxiety  . Ibuprofen (ADVIL PO) Take 1 tablet by mouth as needed (AS NEEDED FOR MODERATE PAIN AND HEADCAHES).   Marland Kitchen  montelukast (SINGULAIR) 10 MG tablet Take 1 tablet (10 mg total) by mouth at bedtime.  . Multiple Vitamin (MULTIVITAMIN) tablet Take 1 tablet by mouth daily.    . Tiotropium Bromide Monohydrate (SPIRIVA RESPIMAT) 2.5 MCG/ACT AERS Inhale 2 puffs into the lungs daily.  . VENTOLIN HFA 108 (90 Base) MCG/ACT inhaler INHALE 2 PUFFS EVERY 4 HOURS AS NEEDED  . [DISCONTINUED] ADVAIR DISKUS 250-50 MCG/DOSE AEPB INHALE 1 PUFF TWICE DAILY (Patient not taking: Reported on 02/09/2017)  . [DISCONTINUED] predniSONE (DELTASONE) 10 MG tablet 4 tabs for 2 days, then 3 tabs for 2 days, 2 tabs for 2 days, then 1 tab for 2 days, then stop (Patient not taking: Reported on 02/09/2017)  . [DISCONTINUED] Spacer/Aero-Holding Chambers (AEROCHAMBER MV) inhaler Use as  instructed  . [DISCONTINUED] SPIRIVA HANDIHALER 18 MCG inhalation capsule INHALE THE CONTENTS OF 1 CAPSULE EVERY DAY. (Patient not taking: Reported on 02/09/2017)   No facility-administered encounter medications on file as of 02/09/2017.      Review of Systems  Constitutional:   No  weight loss, night sweats,  Fevers, chills, fatigue, or  lassitude.  HEENT:   No headaches,  Difficulty swallowing,  Tooth/dental problems, or  Sore throat,                No sneezing, itching, ear ache, nasal congestion, post nasal drip,   CV:  No chest pain,  Orthopnea, PND, swelling in lower extremities, anasarca, dizziness, palpitations, syncope.   GI  No heartburn, indigestion, abdominal pain, nausea, vomiting, diarrhea, change in bowel habits, loss of appetite, bloody stools.   Resp: .  No excess mucus, no productive cough,  No non-productive cough,  No coughing up of blood.  No change in color of mucus.  No wheezing.  No chest wall deformity  Skin: no rash or lesions.  GU: no dysuria, change in color of urine, no urgency or frequency.  No flank pain, no hematuria   MS:  No joint pain or swelling.  No decreased range of motion.  No back pain.    Physical Exam  BP 102/64 (BP Location: Left Arm, Cuff Size: Normal)   Pulse 77   Ht 5' 5.5" (1.664 m)   Wt 133 lb 12.8 oz (60.7 kg)   SpO2 93%   BMI 21.93 kg/m   GEN: A/Ox3; pleasant , NAD, thin elderly female    HEENT:  [place]/AT,  EACs-clear, TMs-wnl, NOSE-clear, THROAT-clear, no lesions, no postnasal drip or exudate noted.   NECK:  Supple w/ fair ROM; no JVD; normal carotid impulses w/o bruits; no thyromegaly or nodules palpated; no lymphadenopathy.    RESP  diminished breath sounds in the bases  no accessory muscle use, no dullness to percussion  CARD:  RRR, no m/r/g, no peripheral edema, pulses intact, no cyanosis or clubbing.  GI:   Soft & nt; nml bowel sounds; no organomegaly or masses detected.   Musco: Warm bil, no deformities or joint  swelling noted.   Neuro: alert, no focal deficits noted.    Skin: Warm, no lesions or rashes   Lab Results:  CBC    BNP No results found for: BNP  ProBNP No results found for: PROBNP  Imaging: No results found.   Assessment & Plan:   COPD (chronic obstructive pulmonary disease) Improved control on current regimen.   Plan  Patient Instructions  Continue on Symbicort 2 puffs Twice daily   Continue on Spiriva daily .  Rinse after inhalers .  Continue with Oxygen 3l/m  with activity As needed  .  Follow up Dr. Lamonte Sakai  In 3 months and As needed  .  Please contact office for sooner follow up if symptoms do not improve or worsen or seek emergency care      RESPIRATORY FAILURE, CHRONIC Cont on O2 .      Rexene Edison, NP 02/09/2017

## 2017-02-09 NOTE — Assessment & Plan Note (Signed)
Cont on O2 .  

## 2017-02-09 NOTE — Patient Instructions (Addendum)
Continue on Symbicort 2 puffs Twice daily   Continue on Spiriva daily .  Rinse after inhalers .  Continue with Oxygen 3l/m with activity As needed  .  Follow up Dr. Lamonte Sakai  In 3 months and As needed  .  Please contact office for sooner follow up if symptoms do not improve or worsen or seek emergency care

## 2017-02-11 IMAGING — CT CT CHEST LUNG CANCER SCREENING LOW DOSE W/O CM
2 of 4 series · 15 of 40 positions shown, 18 images · non-contrast
Comparison: 07/30/2015.

CLINICAL DATA: Asymptomatic ex-smoker, quitting in 9 years ago.
Fifty pack-year smoking history.

EXAM:
CT CHEST WITHOUT CONTRAST LOW-DOSE FOR LUNG CANCER SCREENING
TECHNIQUE: Multidetector CT imaging of the chest was performed following the
standard protocol without IV contrast.

[Series 3: thorax 5.0 i31f 3 · axial · 0.66mm/px · z∈[-253,+22]mm · 12 of 67 slices shown, 15 images]
[im 6/67  mediastinal]
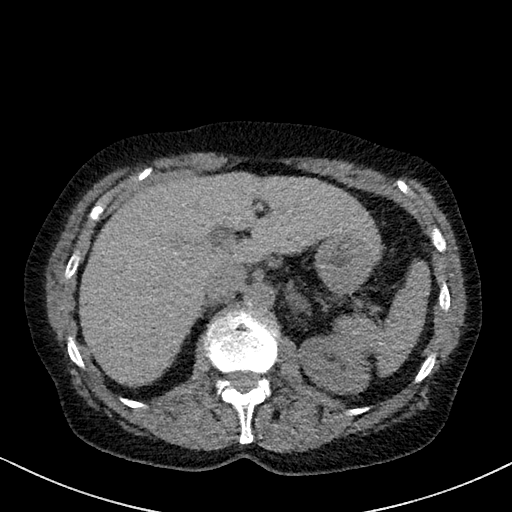
[im 6/67  lung]
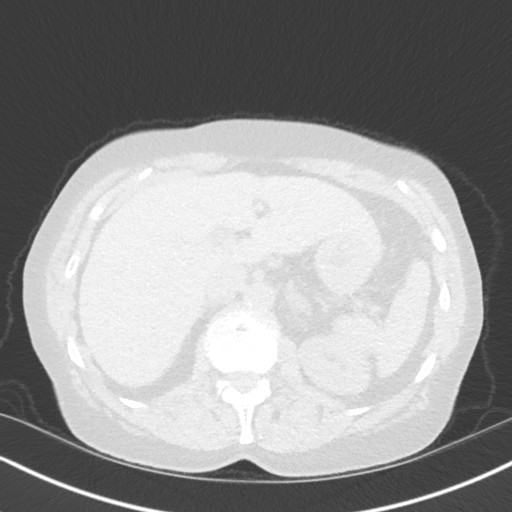
[im 11/67  lung]
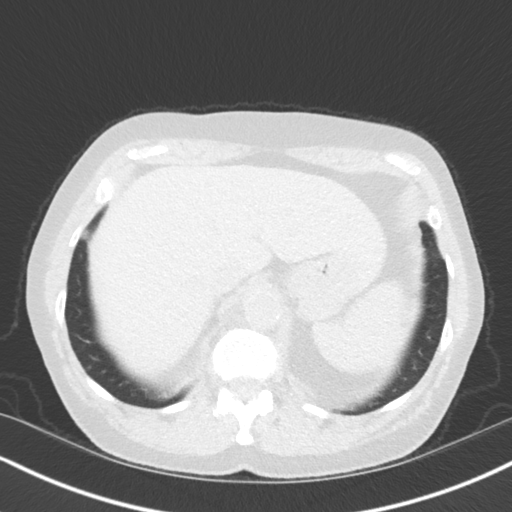
[im 16/67  lung]
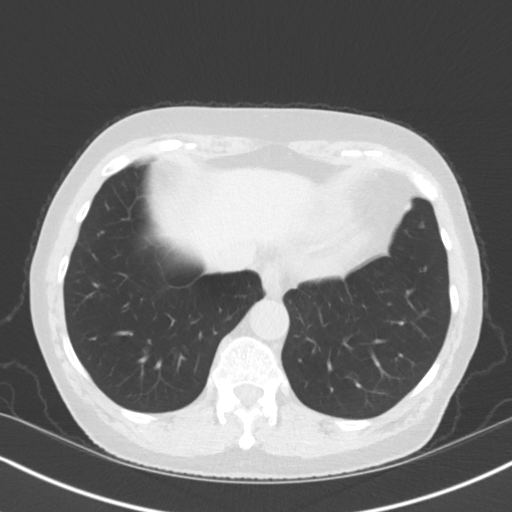
[im 21/67  lung]
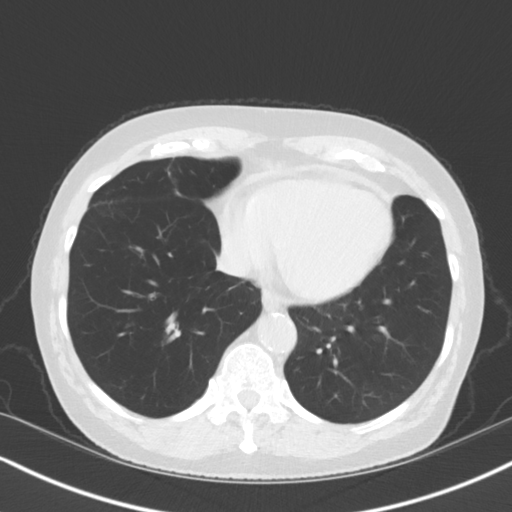
[im 26/67  mediastinal]
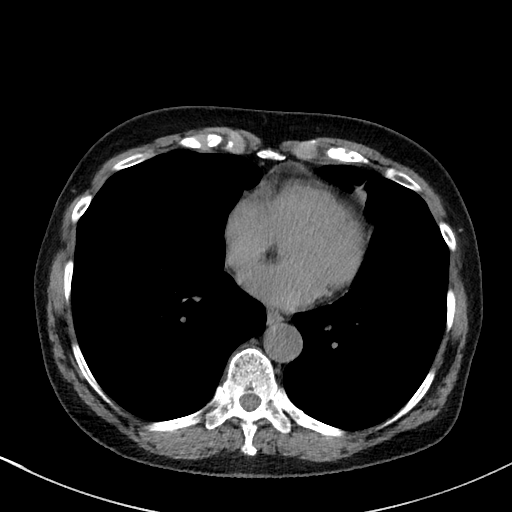
[im 26/67  lung]
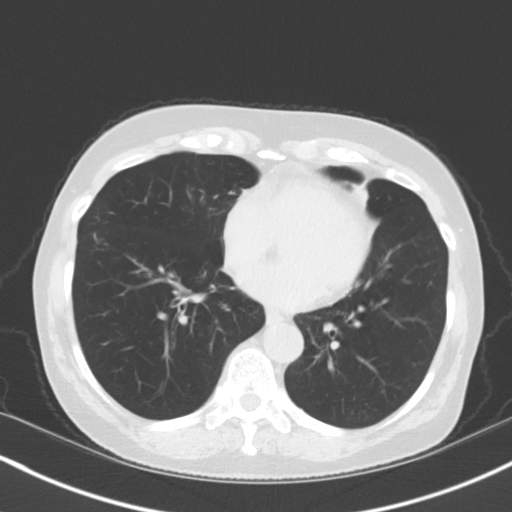
[im 31/67  lung]
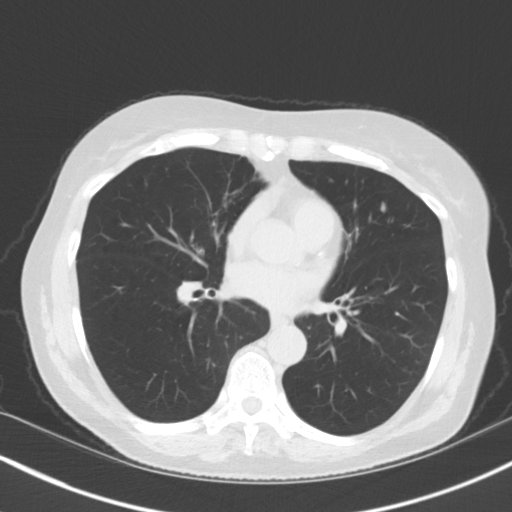
[im 36/67  lung]
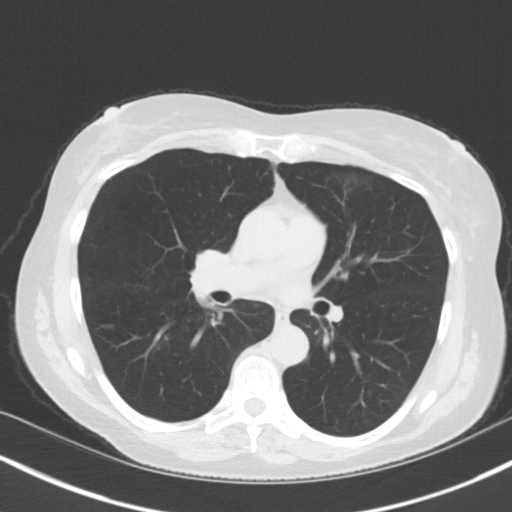
[im 41/67  lung]
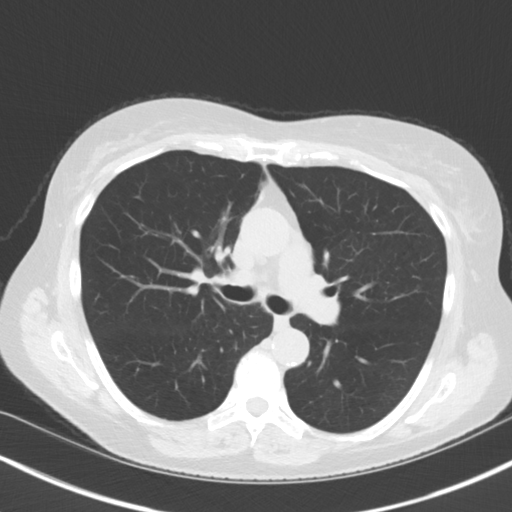
[im 46/67  mediastinal]
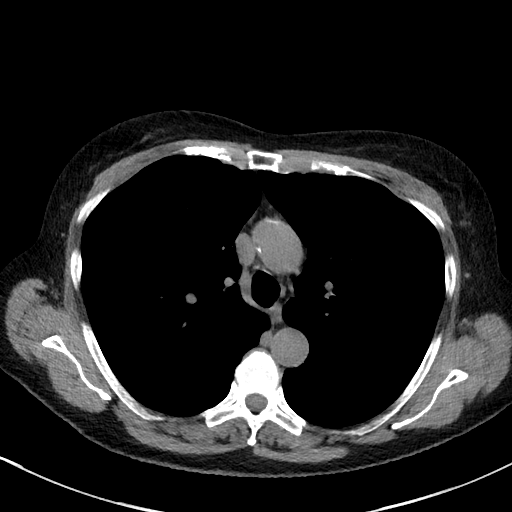
[im 46/67  lung]
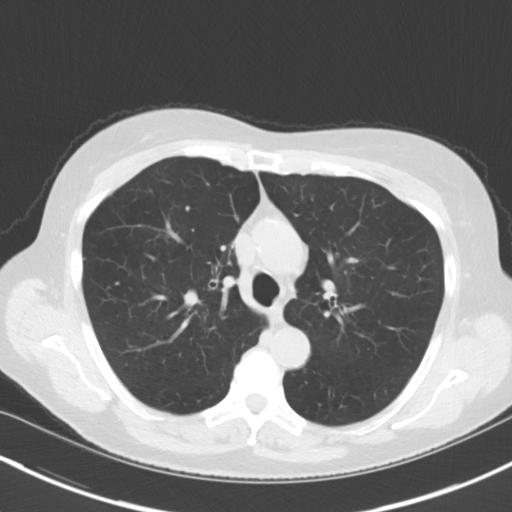
[im 51/67  lung]
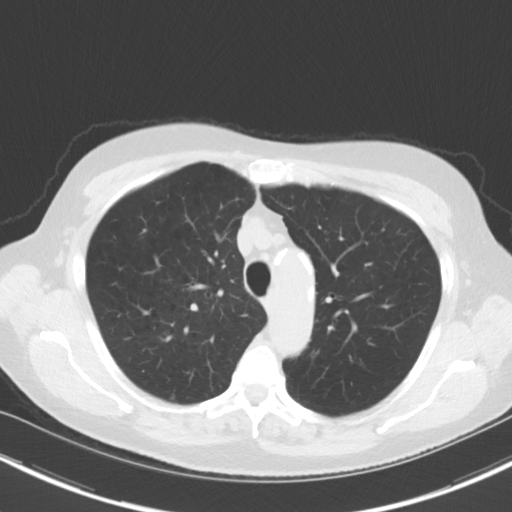
[im 56/67  lung]
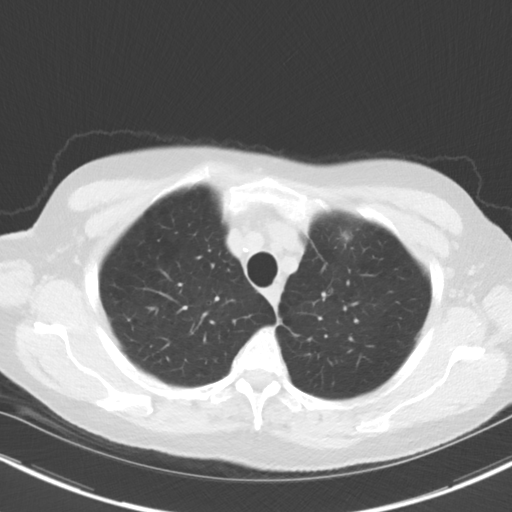
[im 61/67  lung]
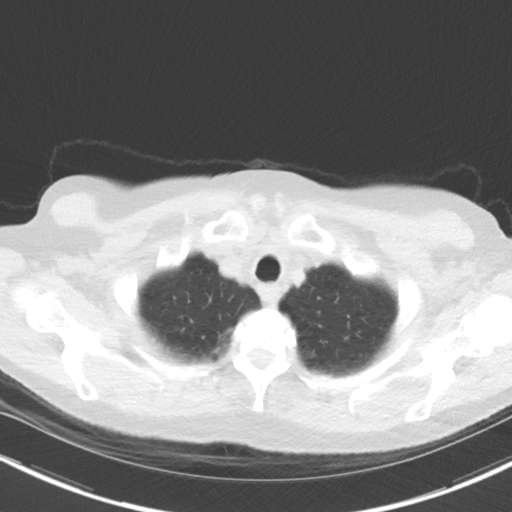

[Series 6: coronal · coronal · 0.59mm/px · 3 of 110 slices shown]
[im 22/110  lung]
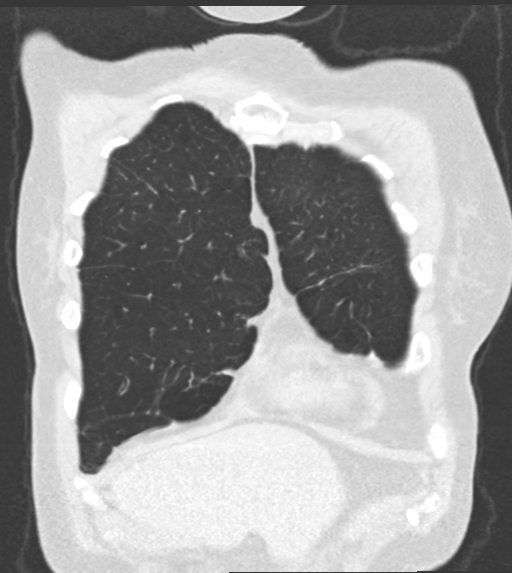
[im 44/110  lung]
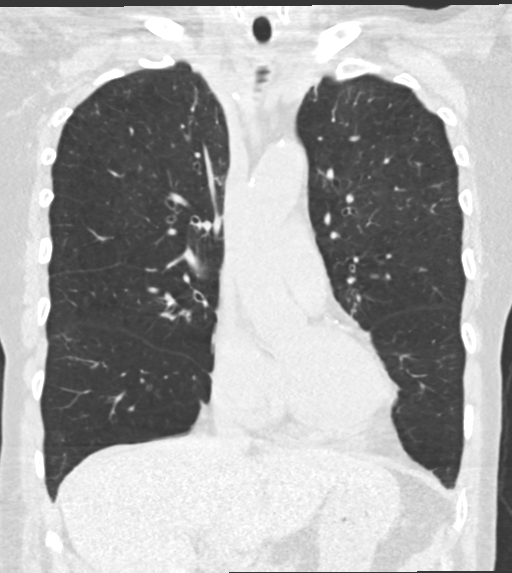
[im 66/110  lung]
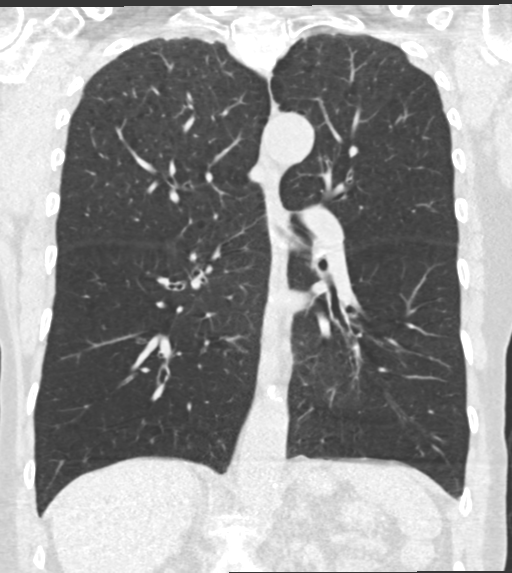

[15 of 40 positions shown; findings below may reference images not displayed]

FINDINGS: Cardiovascular: Aortic and branch vessel atherosclerosis. Normal
heart size. Mild anterior pericardial fluid or thickening is likely
physiologic. Multivessel coronary artery atherosclerosis.

Mediastinum/Nodes: No mediastinal or definite hilar adenopathy,
given limitations of unenhanced CT.

Lungs/Pleura: No pleural fluid. Moderate centrilobular emphysema.
Bilateral pulmonary nodules are not significantly changed. Areas of
probable scarring in the right lower lobe and lingula are also
similar. No dominant or highly suspicious nodule identified.

Upper Abdomen: Normal imaged portions of the liver, spleen, stomach,
pancreas, right adrenal gland, kidneys. Left adrenal thickening. The
common duct is incompletely imaged but may be borderline prominent.
Example 11 mm on image 65.

Musculoskeletal: No acute osseous abnormality.
IMPRESSION: 1. Lung-RADS Category 2, benign appearance or behavior. Continue
annual screening with low-dose chest CT without contrast in 12
months.
2.  Coronary artery atherosclerosis. Aortic atherosclerosis.
3. Cholecystectomy. The common duct is incompletely imaged but
appears borderline dilated. Consider correlation with bilirubin
levels.

## 2017-02-15 ENCOUNTER — Other Ambulatory Visit: Payer: Self-pay | Admitting: Cardiovascular Disease

## 2017-02-18 ENCOUNTER — Ambulatory Visit
Admission: RE | Admit: 2017-02-18 | Discharge: 2017-02-18 | Disposition: A | Discharge status: Home / Self Care | Payer: Medicare Other | Source: Ambulatory Visit | Attending: Family Medicine | Admitting: Family Medicine

## 2017-02-18 DIAGNOSIS — Z1231 Encounter for screening mammogram for malignant neoplasm of breast: Secondary | ICD-10-CM | POA: Diagnosis not present

## 2017-05-12 DIAGNOSIS — I10 Essential (primary) hypertension: Secondary | ICD-10-CM | POA: Diagnosis not present

## 2017-05-12 DIAGNOSIS — E785 Hyperlipidemia, unspecified: Secondary | ICD-10-CM | POA: Diagnosis not present

## 2017-05-12 DIAGNOSIS — J449 Chronic obstructive pulmonary disease, unspecified: Secondary | ICD-10-CM | POA: Diagnosis not present

## 2017-05-12 DIAGNOSIS — F322 Major depressive disorder, single episode, severe without psychotic features: Secondary | ICD-10-CM | POA: Diagnosis not present

## 2017-05-25 ENCOUNTER — Ambulatory Visit (INDEPENDENT_AMBULATORY_CARE_PROVIDER_SITE_OTHER): Payer: Medicare Other | Admitting: Emergency Medicine

## 2017-05-25 ENCOUNTER — Encounter: Payer: Self-pay | Admitting: Emergency Medicine

## 2017-05-25 DIAGNOSIS — J449 Chronic obstructive pulmonary disease, unspecified: Secondary | ICD-10-CM

## 2017-05-25 NOTE — Patient Instructions (Signed)
Please continue your Spiriva and Symbicort as you are taking them  Take albuterol 2 puffs if needed for shortness of breath or wheezing  Flu shot today Our goal with your oxygen is to keep your SpO2 above 90% when walking. Check your saturations with exercise to make sure that you are not dropping below this level.  Follow with Dr Lamonte Sakai in 6 months or sooner if you have any problems

## 2017-05-25 NOTE — Progress Notes (Signed)
History of Present Illness:   ROV 05/25/17 --follow-up visit for 76 year old woman with severe COPD. She has been managed with Spiriva and Symbicort; changed from [place] in March. She had an acute exacerbation in April that required pred and abx. She has oxygen that she uses prn w exertion on 3L/min. She is able to walk some and does her housework without it. She is using albuterol about 1-2x a month. She averages about 1 AE a year. She coughs about every morning, clear mucous. Occasional wheeze.      Vitals:   05/25/17 1353 05/25/17 1354  BP:  122/70  Pulse:  82  SpO2:  92%  Weight: 137 lb (62.1 kg)   Height: 5' 6.5" (1.689 m)    Gen: Pleasant, elderly  in no distress,  normal affect  ENT: No lesions,  mouth clear,  oropharynx clear, intermittent postnasal drip  Neck: No JVD, no TMG, no carotid bruits  Lungs: No use of accessory muscles, distant breath sounds, prolonged expiratory phase, no wheezing  Cardiovascular: RRR, heart sounds normal, no murmur or gallops, no peripheral edema  Musculoskeletal: No deformities, no cyanosis or clubbing  Neuro: alert, non focal  Skin: Warm, no lesions or rashes   COPD (chronic obstructive pulmonary disease) Please continue your Spiriva and Symbicort as you are taking them  Take albuterol 2 puffs if needed for shortness of breath or wheezing  Flu shot today Our goal with your oxygen is to keep your SpO2 above 90% when walking. Check your saturations with exercise to make sure that you are not dropping below this level.  Follow with Dr Lamonte Sakai in 6 months or sooner if you have any problems    Baltazar Apo, MD, PhD 05/25/2017, 2:15 PM [facility] [phone] or if no answer [phone]

## 2017-05-25 NOTE — Assessment & Plan Note (Signed)
Please continue your Spiriva and Symbicort as you are taking them  Take albuterol 2 puffs if needed for shortness of breath or wheezing  Flu shot today Our goal with your oxygen is to keep your SpO2 above 90% when walking. Check your saturations with exercise to make sure that you are not dropping below this level.  Follow with Dr Lamonte Sakai in 6 months or sooner if you have any problems

## 2017-07-20 DIAGNOSIS — E785 Hyperlipidemia, unspecified: Secondary | ICD-10-CM | POA: Diagnosis not present

## 2017-07-20 DIAGNOSIS — Z0000 Encounter for general adult medical examination without abnormal findings: Secondary | ICD-10-CM | POA: Diagnosis not present

## 2017-07-20 DIAGNOSIS — F410 Panic disorder [episodic paroxysmal anxiety] without agoraphobia: Secondary | ICD-10-CM | POA: Diagnosis not present

## 2017-07-20 DIAGNOSIS — F39 Unspecified mood [affective] disorder: Secondary | ICD-10-CM | POA: Diagnosis not present

## 2017-07-20 DIAGNOSIS — I10 Essential (primary) hypertension: Secondary | ICD-10-CM | POA: Diagnosis not present

## 2017-07-20 DIAGNOSIS — F322 Major depressive disorder, single episode, severe without psychotic features: Secondary | ICD-10-CM | POA: Diagnosis not present

## 2017-07-20 DIAGNOSIS — Z1389 Encounter for screening for other disorder: Secondary | ICD-10-CM | POA: Diagnosis not present

## 2017-07-20 DIAGNOSIS — M8580 Other specified disorders of bone density and structure, unspecified site: Secondary | ICD-10-CM | POA: Diagnosis not present

## 2017-07-20 DIAGNOSIS — E559 Vitamin D deficiency, unspecified: Secondary | ICD-10-CM | POA: Diagnosis not present

## 2017-07-20 DIAGNOSIS — J449 Chronic obstructive pulmonary disease, unspecified: Secondary | ICD-10-CM | POA: Diagnosis not present

## 2017-08-11 ENCOUNTER — Ambulatory Visit (INDEPENDENT_AMBULATORY_CARE_PROVIDER_SITE_OTHER)
Admission: RE | Admit: 2017-08-11 | Discharge: 2017-08-11 | Disposition: A | Discharge status: Home / Self Care | Payer: Medicare Other | Source: Ambulatory Visit | Attending: Acute Care | Admitting: Acute Care

## 2017-08-11 ENCOUNTER — Ambulatory Visit: Payer: Medicare Other

## 2017-08-11 DIAGNOSIS — Z87891 Personal history of nicotine dependence: Secondary | ICD-10-CM | POA: Diagnosis not present

## 2017-08-20 ENCOUNTER — Other Ambulatory Visit: Payer: Self-pay | Admitting: Acute Care

## 2017-08-20 DIAGNOSIS — Z122 Encounter for screening for malignant neoplasm of respiratory organs: Secondary | ICD-10-CM

## 2017-08-20 DIAGNOSIS — Z87891 Personal history of nicotine dependence: Secondary | ICD-10-CM

## 2017-09-07 DIAGNOSIS — M810 Age-related osteoporosis without current pathological fracture: Secondary | ICD-10-CM | POA: Diagnosis not present

## 2017-09-07 DIAGNOSIS — M8588 Other specified disorders of bone density and structure, other site: Secondary | ICD-10-CM | POA: Diagnosis not present

## 2017-09-09 DIAGNOSIS — H35371 Puckering of macula, right eye: Secondary | ICD-10-CM | POA: Diagnosis not present

## 2017-09-09 DIAGNOSIS — Z9849 Cataract extraction status, unspecified eye: Secondary | ICD-10-CM | POA: Diagnosis not present

## 2017-09-09 DIAGNOSIS — Z961 Presence of intraocular lens: Secondary | ICD-10-CM | POA: Diagnosis not present

## 2017-09-09 DIAGNOSIS — H524 Presbyopia: Secondary | ICD-10-CM | POA: Diagnosis not present

## 2017-09-09 DIAGNOSIS — H5203 Hypermetropia, bilateral: Secondary | ICD-10-CM | POA: Diagnosis not present

## 2017-09-09 DIAGNOSIS — H52223 Regular astigmatism, bilateral: Secondary | ICD-10-CM | POA: Diagnosis not present

## 2017-11-23 ENCOUNTER — Ambulatory Visit (INDEPENDENT_AMBULATORY_CARE_PROVIDER_SITE_OTHER): Payer: Medicare Other | Admitting: Adult Health

## 2017-11-23 ENCOUNTER — Encounter: Payer: Self-pay | Admitting: Adult Health

## 2017-11-23 VITALS — BP 118/80 | HR 73 | Ht 66.0 in | Wt 135.8 lb

## 2017-11-23 DIAGNOSIS — Z23 Encounter for immunization: Secondary | ICD-10-CM | POA: Diagnosis not present

## 2017-11-23 DIAGNOSIS — J9611 Chronic respiratory failure with hypoxia: Secondary | ICD-10-CM

## 2017-11-23 DIAGNOSIS — J449 Chronic obstructive pulmonary disease, unspecified: Secondary | ICD-10-CM

## 2017-11-23 NOTE — Assessment & Plan Note (Signed)
Doing well on current regimen   Plan  Patient Instructions  Pneumovax vaccine today .  Continue on Symbicort 2 puffs Twice daily   Continue on Spiriva daily .  Rinse after inhalers .  Activity as tolerated .  Continue with Oxygen 2-3 3l/m with activity and At bedtime  .  Follow up Dr. Lamonte Sakai  In 6 months and As needed  .  Please contact office for sooner follow up if symptoms do not improve or worsen or seek emergency care

## 2017-11-23 NOTE — Patient Instructions (Signed)
Pneumovax vaccine today .  Continue on Symbicort 2 puffs Twice daily   Continue on Spiriva daily .  Rinse after inhalers .  Activity as tolerated .  Continue with Oxygen 2-3 3l/m with activity and At bedtime  .  Follow up Dr. Lamonte Sakai  In 6 months and As needed  .  Please contact office for sooner follow up if symptoms do not improve or worsen or seek emergency care

## 2017-11-23 NOTE — Addendum Note (Signed)
Addended by: Parke Poisson E on: 11/23/2017 04:56 PM   Modules accepted: Orders

## 2017-11-23 NOTE — Assessment & Plan Note (Signed)
Cont on oxygen with act and At bedtime    Plan  Patient Instructions  Pneumovax vaccine today .  Continue on Symbicort 2 puffs Twice daily   Continue on Spiriva daily .  Rinse after inhalers .  Activity as tolerated .  Continue with Oxygen 2-3 3l/m with activity and At bedtime  .  Follow up Dr. Lamonte Sakai  In 6 months and As needed  .  Please contact office for sooner follow up if symptoms do not improve or worsen or seek emergency care

## 2017-11-23 NOTE — Progress Notes (Signed)
@Patient  ID: Ariel Gonzalez, female    DOB: 1940-09-18, 77 y.o.   MRN: [id number]  Chief Complaint  Patient presents with  . Follow-up    COPD     Referring provider: Gaynelle Arabian, MD  HPI: 77 year old female former smoker followed for severe COPD and chronic hypoxic respiratory failure on oxygen  TEST  2009>>FEV1 of 1.35 or 59% of predicted, an FVC of 3.1 or 98% of predicted, and a ratio of 43% consistent with severe airflow limitation.  2014 FEV1 43%, ratio 51.  10/2016 FEV1 19% , ratio  11/09/16 Spirometry today is improved today with FEV1 at 31%. , ratio 38.   11/23/2017 Follow up : COPD , O2 RF  Patient presents for a six-month follow-up for severe COPD.  She remains on Symbicort and Spiriva. Says she is doing well . No flare of cough or dyspnea .   Remains on oxygen with activity and At bedtime  At 2l/m , 3llm with heavy activity .   Remains somewhat active , goes shopping, visits with family . No exercise .  Does housework .   Prevnar 2015 .   LDCT Chest 07/2017  Benign appearance .      Allergies  Allergen Reactions  . Cefaclor     rash  . Clarithromycin     rash  . Erythromycin     rash  . Iodine     rash    Immunization History  Administered Date(s) Administered  . Influenza Split 05/13/2012  . Influenza Whole 05/25/2011  . Influenza, High Dose Seasonal PF 07/13/2016  . Influenza,inj,Quad PF,6+ Mos 04/18/2013, 05/04/2014, 06/13/2015  . Influenza-Unspecified 07/13/2016  . Pneumococcal Conjugate-13 06/18/2014    Past Medical History:  Diagnosis Date  . Allergic rhinitis   . Arthritis    CERVICAL AND LUMBER DDD  . COPD (chronic obstructive pulmonary disease) ([place])    HFA 75% p coaching July 17, 2010  . HTN (hypertension)   . Migraine   . Osteopenia   . Overactive bladder   . Panic attacks   . Squamous cell carcinoma of skin     Tobacco History: Social History   Tobacco Use  Smoking Status Former Smoker  . Packs/day: 1.00  .  Years: 50.00  . Pack years: 50.00  . Types: Cigarettes  . Last attempt to quit: 04/01/2008  . Years since quitting: 9.6  Smokeless Tobacco Never Used  Tobacco Comment   Counseled to remain smoke free   Counseling given: Not Answered Comment: Counseled to remain smoke free   Outpatient Encounter Medications as of 11/23/2017  Medication Sig  . ALPRAZolam (XANAX) 0.5 MG tablet Take 0.5 mg by mouth 3 (three) times daily as needed for anxiety or sleep. 1 every 6 hours as needed  . amLODipine (NORVASC) 5 MG tablet Take 5 mg by mouth daily.  Marland Kitchen atorvastatin (LIPITOR) 40 MG tablet TAKE 1 TABLET BY MOUTH ONCE DAILY  . budesonide-formoterol (SYMBICORT) 160-4.5 MCG/ACT inhaler Inhale 2 puffs into the lungs 2 (two) times daily.  . calcium carbonate (OS-CAL) 600 MG TABS Take 600 mg by mouth daily.    . Cholecalciferol (PA VITAMIN D-3) 2000 UNITS CAPS Take 2,000 Units by mouth daily.   . citalopram (CELEXA) 20 MG tablet Take 40 mg by mouth daily.   . clonazePAM (KLONOPIN) 1 MG tablet Half tablet to whole tablet twice a day for anxiety  . Ibuprofen (ADVIL PO) Take 1 tablet by mouth as needed (AS NEEDED FOR MODERATE PAIN AND  HEADCAHES).   . montelukast (SINGULAIR) 10 MG tablet Take 1 tablet (10 mg total) by mouth at bedtime.  . Multiple Vitamin (MULTIVITAMIN) tablet Take 1 tablet by mouth daily.    . Tiotropium Bromide Monohydrate (SPIRIVA RESPIMAT) 2.5 MCG/ACT AERS Inhale 2 puffs into the lungs daily.  . VENTOLIN HFA 108 (90 Base) MCG/ACT inhaler INHALE 2 PUFFS EVERY 4 HOURS AS NEEDED   No facility-administered encounter medications on file as of 11/23/2017.      Review of Systems  Constitutional:   No  weight loss, night sweats,  Fevers, chills, + fatigue, or  lassitude.  HEENT:   No headaches,  Difficulty swallowing,  Tooth/dental problems, or  Sore throat,                No sneezing, itching, ear ache, nasal congestion, post nasal drip,   CV:  No chest pain,  Orthopnea, PND, swelling in  lower extremities, anasarca, dizziness, palpitations, syncope.   GI  No heartburn, indigestion, abdominal pain, nausea, vomiting, diarrhea, change in bowel habits, loss of appetite, bloody stools.   Resp:  No excess mucus, no productive cough,  No non-productive cough,  No coughing up of blood.  No change in color of mucus.  No wheezing.  No chest wall deformity  Skin: no rash or lesions.  GU: no dysuria, change in color of urine, no urgency or frequency.  No flank pain, no hematuria   MS:  No joint pain or swelling.  No decreased range of motion.  No back pain.    Physical Exam  BP 118/80 (BP Location: Left Arm, Cuff Size: Normal)   Pulse 73   Ht 5\' 6"  (1.676 m)   Wt 135 lb 12.8 oz (61.6 kg)   SpO2 94%   BMI 21.92 kg/m   GEN: A/Ox3; pleasant , NAD, thin and elderly    HEENT:  [place]/AT,  EACs-clear, TMs-wnl, NOSE-clear, THROAT-clear, no lesions, no postnasal drip or exudate noted.   NECK:  Supple w/ fair ROM; no JVD; normal carotid impulses w/o bruits; no thyromegaly or nodules palpated; no lymphadenopathy.    RESP  Decreased BS in bases,  no accessory muscle use, no dullness to percussion  CARD:  RRR, no m/r/g, no peripheral edema, pulses intact, no cyanosis or clubbing.  GI:   Soft & nt; nml bowel sounds; no organomegaly or masses detected.   Musco: Warm bil, no deformities or joint swelling noted.   Neuro: alert, no focal deficits noted.    Skin: Warm, no lesions or rashes    Lab Results:  CBC  BNP No results found for: BNP  ProBNP No results found for: PROBNP  Imaging: No results found.   Assessment & Plan:   COPD (chronic obstructive pulmonary disease) Doing well on current regimen   Plan  Patient Instructions  Pneumovax vaccine today .  Continue on Symbicort 2 puffs Twice daily   Continue on Spiriva daily .  Rinse after inhalers .  Activity as tolerated .  Continue with Oxygen 2-3 3l/m with activity and At bedtime  .  Follow up Dr. Lamonte Sakai  In 6  months and As needed  .  Please contact office for sooner follow up if symptoms do not improve or worsen or seek emergency care       RESPIRATORY FAILURE, CHRONIC Cont on oxygen with act and At bedtime    Plan  Patient Instructions  Pneumovax vaccine today .  Continue on Symbicort 2 puffs Twice daily  Continue on Spiriva daily .  Rinse after inhalers .  Activity as tolerated .  Continue with Oxygen 2-3 3l/m with activity and At bedtime  .  Follow up Dr. Lamonte Sakai  In 6 months and As needed  .  Please contact office for sooner follow up if symptoms do not improve or worsen or seek emergency care          Rexene Edison, NP 11/23/2017

## 2017-11-25 ENCOUNTER — Telehealth: Payer: Self-pay | Admitting: Emergency Medicine

## 2017-11-25 MED ORDER — BUDESONIDE-FORMOTEROL FUMARATE 160-4.5 MCG/ACT IN AERO: 2.0000 | INHALATION_SPRAY | Freq: Two times a day (BID) | RESPIRATORY_TRACT | 11 refills | Status: DC

## 2017-11-25 NOTE — Telephone Encounter (Signed)
Rx sent to the pharmacy.

## 2017-11-29 ENCOUNTER — Other Ambulatory Visit: Payer: Self-pay | Admitting: *Deleted

## 2017-11-29 MED ORDER — BUDESONIDE-FORMOTEROL FUMARATE 160-4.5 MCG/ACT IN AERO: 2.0000 | INHALATION_SPRAY | Freq: Two times a day (BID) | RESPIRATORY_TRACT | 5 refills | Status: DC

## 2018-01-11 ENCOUNTER — Other Ambulatory Visit: Payer: Self-pay | Admitting: Family Medicine

## 2018-01-11 DIAGNOSIS — Z1231 Encounter for screening mammogram for malignant neoplasm of breast: Secondary | ICD-10-CM

## 2018-02-28 ENCOUNTER — Ambulatory Visit
Admission: RE | Admit: 2018-02-28 | Discharge: 2018-02-28 | Disposition: A | Discharge status: Home / Self Care | Payer: Medicare Other | Source: Ambulatory Visit | Attending: Family Medicine | Admitting: Family Medicine

## 2018-02-28 DIAGNOSIS — Z1231 Encounter for screening mammogram for malignant neoplasm of breast: Secondary | ICD-10-CM | POA: Diagnosis not present

## 2018-04-04 ENCOUNTER — Telehealth: Payer: Self-pay | Admitting: Emergency Medicine

## 2018-04-04 NOTE — Telephone Encounter (Signed)
Patient has been scheduled for a flu shot on 9/26 at 145. She verbalized understanding. Nothing further needed at time of call.

## 2018-04-04 NOTE — Telephone Encounter (Signed)
Her pneumonia shots are up to date

## 2018-04-04 NOTE — Telephone Encounter (Signed)
Left message for patient to call back  

## 2018-04-04 NOTE — Telephone Encounter (Signed)
Patient is requesting to have flu shot done. She is also wanting to know if she needs to have another pneumonia vaccine. Patient states if she does then she will wait to schedule these both on the same day. RB please advise, thank you.

## 2018-04-07 ENCOUNTER — Ambulatory Visit (INDEPENDENT_AMBULATORY_CARE_PROVIDER_SITE_OTHER): Payer: Medicare Other

## 2018-04-07 DIAGNOSIS — Z23 Encounter for immunization: Secondary | ICD-10-CM

## 2018-04-21 DIAGNOSIS — F39 Unspecified mood [affective] disorder: Secondary | ICD-10-CM | POA: Diagnosis not present

## 2018-04-21 DIAGNOSIS — F410 Panic disorder [episodic paroxysmal anxiety] without agoraphobia: Secondary | ICD-10-CM | POA: Diagnosis not present

## 2018-04-21 DIAGNOSIS — F331 Major depressive disorder, recurrent, moderate: Secondary | ICD-10-CM | POA: Diagnosis not present

## 2018-07-26 DIAGNOSIS — E559 Vitamin D deficiency, unspecified: Secondary | ICD-10-CM | POA: Diagnosis not present

## 2018-07-26 DIAGNOSIS — F3342 Major depressive disorder, recurrent, in full remission: Secondary | ICD-10-CM | POA: Diagnosis not present

## 2018-07-26 DIAGNOSIS — J449 Chronic obstructive pulmonary disease, unspecified: Secondary | ICD-10-CM | POA: Diagnosis not present

## 2018-07-26 DIAGNOSIS — F410 Panic disorder [episodic paroxysmal anxiety] without agoraphobia: Secondary | ICD-10-CM | POA: Diagnosis not present

## 2018-07-26 DIAGNOSIS — E785 Hyperlipidemia, unspecified: Secondary | ICD-10-CM | POA: Diagnosis not present

## 2018-07-26 DIAGNOSIS — M8580 Other specified disorders of bone density and structure, unspecified site: Secondary | ICD-10-CM | POA: Diagnosis not present

## 2018-07-26 DIAGNOSIS — Z0000 Encounter for general adult medical examination without abnormal findings: Secondary | ICD-10-CM | POA: Diagnosis not present

## 2018-07-26 DIAGNOSIS — I10 Essential (primary) hypertension: Secondary | ICD-10-CM | POA: Diagnosis not present

## 2018-07-26 DIAGNOSIS — F39 Unspecified mood [affective] disorder: Secondary | ICD-10-CM | POA: Diagnosis not present

## 2018-08-11 ENCOUNTER — Ambulatory Visit (INDEPENDENT_AMBULATORY_CARE_PROVIDER_SITE_OTHER)
Admission: RE | Admit: 2018-08-11 | Discharge: 2018-08-11 | Disposition: A | Discharge status: Home / Self Care | Payer: Medicare Other | Source: Ambulatory Visit | Attending: Acute Care | Admitting: Acute Care

## 2018-08-11 DIAGNOSIS — Z122 Encounter for screening for malignant neoplasm of respiratory organs: Secondary | ICD-10-CM

## 2018-08-11 DIAGNOSIS — Z87891 Personal history of nicotine dependence: Secondary | ICD-10-CM | POA: Diagnosis not present

## 2018-08-12 ENCOUNTER — Ambulatory Visit: Payer: Medicare Other

## 2018-08-22 ENCOUNTER — Telehealth: Payer: Self-pay | Admitting: Emergency Medicine

## 2018-08-22 NOTE — Telephone Encounter (Signed)
LMTCB x1 for pt.  

## 2018-08-22 NOTE — Telephone Encounter (Signed)
LMTCB x2 for pt 

## 2018-08-22 NOTE — Telephone Encounter (Signed)
Patient returned call, CB is [phone]

## 2018-08-23 MED ORDER — BUDESONIDE-FORMOTEROL FUMARATE 160-4.5 MCG/ACT IN AERO: 2.0000 | INHALATION_SPRAY | Freq: Two times a day (BID) | RESPIRATORY_TRACT | 0 refills | Status: DC

## 2018-08-23 NOTE — Telephone Encounter (Signed)
Spoke with pt. She is needing to have a generic Symbicort refill. Rx has been sent in. Pt has a pending OV with RB on 09/02/2018. Nothing further was needed.

## 2018-09-02 ENCOUNTER — Ambulatory Visit (INDEPENDENT_AMBULATORY_CARE_PROVIDER_SITE_OTHER): Payer: Medicare Other | Admitting: Emergency Medicine

## 2018-09-02 ENCOUNTER — Encounter: Payer: Self-pay | Admitting: Emergency Medicine

## 2018-09-02 VITALS — BP 122/62 | HR 91 | Ht 64.0 in | Wt 128.6 lb

## 2018-09-02 DIAGNOSIS — I2584 Coronary atherosclerosis due to calcified coronary lesion: Secondary | ICD-10-CM | POA: Diagnosis not present

## 2018-09-02 DIAGNOSIS — J9611 Chronic respiratory failure with hypoxia: Secondary | ICD-10-CM

## 2018-09-02 DIAGNOSIS — I2510 Atherosclerotic heart disease of native coronary artery without angina pectoris: Secondary | ICD-10-CM

## 2018-09-02 DIAGNOSIS — J438 Other emphysema: Secondary | ICD-10-CM | POA: Diagnosis not present

## 2018-09-02 NOTE — Assessment & Plan Note (Signed)
Marginal compliance with her exertional oxygen when she is outside, working in the yard.  We talked today about wearing it at all times and with all exertion.

## 2018-09-02 NOTE — Progress Notes (Signed)
History of Present Illness:   ROV 05/25/17 --follow-up visit for 78 year old woman with severe COPD. She has been managed with Spiriva and Symbicort; changed from [place] in March. She had an acute exacerbation in April that required pred and abx. She has oxygen that she uses prn w exertion on 3L/min. She is able to walk some and does her housework without it. She is using albuterol about 1-2x a month. She averages about 1 AE a year. She coughs about every morning, clear mucous. Occasional wheeze.   ROV 09/02/2018 --[name] is 22.  She follows up today for history of chronic hypoxemic respiratory failure in the setting of severe COPD.  She was last seen here in May 2019.  We have been managing her on Spiriva and Symbicort.  Her Symbicort was recently changed to the generic formulation. She is typically using her O2 at all times - 3L/min. Her activity is fairly curtailed, is still able to do some housework. She she is able to shop, but does get worn out carrying them. Minimal wheeze. She does have am cough, clear to white mucous. No flares since last time. She has had her flu shot. PNA shot is up to date.    Vitals:   09/02/18 1347  BP: 122/62  Pulse: 91  SpO2: 95%  Weight: 128 lb 9.6 oz (58.3 kg)  Height: 5\' 4"  (1.626 m)   Gen: Pleasant, elderly  in no distress,  normal affect  ENT: No lesions,  mouth clear,  oropharynx clear, intermittent postnasal drip  Neck: No JVD, no stridor  Lungs: No use of accessory muscles, distant breath sounds, prolonged expiratory phase, no wheezeon normal breath, she does wheeze on a forced exp  Cardiovascular: RRR, heart sounds normal, no murmur or gallops, no peripheral edema  Musculoskeletal: No deformities, no cyanosis or clubbing  Neuro: alert, non focal  Skin: Warm, no lesions or rashes   COPD (chronic obstructive pulmonary disease) Please continue budesonide/formoterol (Symbicort) 2 puffs twice a day.  Rinse and gargle after using. Continue  Spiriva as you have been taking it. Flu shot and pneumonia shots are up-to-date. We will make a referral to pulmonary [facility] at [facility] Follow with Dr Lamonte Sakai in 6 months or sooner if you have any problems  RESPIRATORY FAILURE, CHRONIC Marginal compliance with her exertional oxygen when she is outside, working in the yard.  We talked today about wearing it at all times and with all exertion.    Baltazar Apo, MD, PhD 09/02/2018, 2:08 PM  [facility] [phone] or if no answer [phone]

## 2018-09-02 NOTE — Assessment & Plan Note (Signed)
Please continue budesonide/formoterol (Symbicort) 2 puffs twice a day.  Rinse and gargle after using. Continue Spiriva as you have been taking it. Flu shot and pneumonia shots are up-to-date. We will make a referral to pulmonary rehabilitation at [facility] Follow with Dr Lamonte Sakai in 6 months or sooner if you have any problems

## 2018-09-02 NOTE — Patient Instructions (Addendum)
Please continue budesonide/formoterol (Symbicort) 2 puffs twice a day.  Rinse and gargle after using. Continue Spiriva as you have been taking it. Please continue your oxygen at 3 L/min.  Try to use this with any and all exertion. Flu shot and pneumonia shots are up-to-date. We will make a referral to pulmonary rehabilitation at [facility] Follow with Dr Lamonte Sakai in 6 months or sooner if you have any problems

## 2018-09-13 ENCOUNTER — Telehealth (HOSPITAL_COMMUNITY): Payer: Self-pay

## 2018-09-13 NOTE — Telephone Encounter (Signed)
Pt insurance is active and benefits verified through Medicare a/b Co-pay 0, DED $198/0 met, out of pocket 0/0 met, co-insurance 20%. no pre-authorization required.  Will contact patient to see if she is interested in the Cardiac Rehab Program. If interested, patient will need to complete follow up appt. Once completed, patient will be contacted for scheduling upon review by the RN Navigator.  2ndary insurance is active and benefits verified through [place]. Co-pay 0, DED 0/0 met, out of pocket 0/0 met, co-insurance 0. No pre-authorization required. Tedra Senegal. Support Rep II

## 2018-09-13 NOTE — Telephone Encounter (Signed)
Attempted to call patient in regards to Pulmonary Rehab - LM on VM °Gloria W. Support Rep II °

## 2018-09-13 NOTE — Telephone Encounter (Signed)
Called patient to see if she was interested in participating in the Pulmonary Rehab Program. Patient stated yes. Patient will come in for orientation on 09/26/2018 @ 9:00am and will attend the 1:30pm exercise class.  Mailed homework package. Tedra [place]. Support Rep II

## 2018-09-26 ENCOUNTER — Ambulatory Visit (HOSPITAL_COMMUNITY): Payer: Medicare Other

## 2018-09-26 ENCOUNTER — Telehealth (HOSPITAL_COMMUNITY): Payer: Self-pay

## 2018-09-26 NOTE — Telephone Encounter (Signed)
Pt called and informed pt of Cardiac and Pulmonary rehab closure for the next 2 weeks. Pt informed that orientation to rehab will be rescheduled.  Joycelyn Man RN, BSN Cardiac and Pulmonary Rehab RN

## 2018-09-30 DIAGNOSIS — J069 Acute upper respiratory infection, unspecified: Secondary | ICD-10-CM | POA: Diagnosis not present

## 2018-09-30 DIAGNOSIS — J449 Chronic obstructive pulmonary disease, unspecified: Secondary | ICD-10-CM | POA: Diagnosis not present

## 2018-09-30 DIAGNOSIS — B370 Candidal stomatitis: Secondary | ICD-10-CM | POA: Diagnosis not present

## 2018-11-02 ENCOUNTER — Telehealth (HOSPITAL_COMMUNITY): Payer: Self-pay | Admitting: Cardiac Rehabilitation

## 2018-11-02 NOTE — Telephone Encounter (Signed)
Phone call to patient to discuss outpatient pulmonary  rehab.  Pt is interested in participating once program reopens from [place] 19 precautions.  Pt is following COVID prevention guidelines.  Pt is eager to learn lifestyle modifications to reduce symptoms and improve quality of life.   Andi Hence, RN, BSN Cardiac Pulmonary Rehab

## 2019-01-23 ENCOUNTER — Telehealth: Payer: Self-pay | Admitting: Emergency Medicine

## 2019-01-23 NOTE — Telephone Encounter (Addendum)
Called and spoke to pt. Pt states she got a call from [employer] and was told they need clinical information regarding pts O2. I called Melissa with [employer] and left a voicemail to see what they need. Will await a call back.

## 2019-01-24 ENCOUNTER — Telehealth (HOSPITAL_COMMUNITY): Payer: Self-pay

## 2019-01-24 NOTE — Telephone Encounter (Signed)
Melissa from [place] returned call on behalf of pt and would like a call  back

## 2019-01-24 NOTE — Telephone Encounter (Signed)
Need clarification from Ariel Gonzalez in regards to the message we received. [employer] is currently closed so will leave encounter open to contact [employer] tomorrow, 7/15

## 2019-01-24 NOTE — Telephone Encounter (Signed)
Melissa called again just to let someone know that the o2 requalification team needed the information so she passed it along

## 2019-01-25 NOTE — Telephone Encounter (Signed)
Due to confusions with Melissa's message, I have sent her a community message to see if pt needs to come into office for a qualifying walk and OV or if there was nothing that was needed to be done. Once Melissa responds to message, will update.

## 2019-01-26 NOTE — Telephone Encounter (Signed)
Message from [place] with [employer]: Ariel Guys  [name], Ariel Gonzalez, Ariel Gonzalez; Ariel Gonzalez.  Ariel Gonzalez and I have reached out to our requal team to see what they are needing. We haven't heard back from them yet but we will let you know when we do. Not sure if they just need records pulled from Epic or if pt needs a new appt. We'll be in touch.    Will update when have more info.

## 2019-01-27 NOTE — Telephone Encounter (Signed)
Waiting for [facility] to call back

## 2019-01-30 ENCOUNTER — Telehealth (HOSPITAL_COMMUNITY): Payer: Self-pay | Admitting: *Deleted

## 2019-01-30 NOTE — Telephone Encounter (Signed)
I have sent another message to the previous community message I started to both [place] and [facility] wondering if they have an update for [place]. Will respond when I know more.

## 2019-01-30 NOTE — Telephone Encounter (Signed)
Called to schedule orientation to pulmonary rehab Thursday, July 23. 2020 @ 1300.

## 2019-01-30 NOTE — Telephone Encounter (Signed)
Called daughter to give directions to Pulmonary Rehab for orientation and walk test Thursday, February 02, 2019 @ 1300.

## 2019-01-31 NOTE — Telephone Encounter (Signed)
Received a message from [place] with [place]. They are still waiting to hear from their requal team to see if there is anything we are needing to do. Will await a response from her with an update.

## 2019-02-01 NOTE — Telephone Encounter (Signed)
Raquel Sarna any updates in community message from [place] or [place]?

## 2019-02-01 NOTE — Telephone Encounter (Signed)
Last message from [facility] was that they are still waiting to hear from their requal team. She said she would send me a response when she does receive an email back. Nothing from her since Monday, 7/20. Will update when able to once I do receive a response from her.

## 2019-02-02 ENCOUNTER — Encounter (HOSPITAL_COMMUNITY)
Admission: RE | Admit: 2019-02-02 | Discharge: 2019-02-02 | Disposition: A | Discharge status: Home / Self Care | Payer: Medicare Other | Source: Ambulatory Visit | Attending: Emergency Medicine | Admitting: Emergency Medicine

## 2019-02-02 ENCOUNTER — Other Ambulatory Visit: Payer: Self-pay

## 2019-02-02 ENCOUNTER — Ambulatory Visit (HOSPITAL_COMMUNITY): Payer: Medicare Other

## 2019-02-02 VITALS — BP 104/64 | Ht 67.0 in | Wt 145.3 lb

## 2019-02-02 DIAGNOSIS — J438 Other emphysema: Secondary | ICD-10-CM | POA: Insufficient documentation

## 2019-02-02 NOTE — Progress Notes (Signed)
Ariel Gonzalez 78 y.o. female Pulmonary Rehab Orientation Note Patient arrived today in Cardiac and Pulmonary Rehab for orientation and 6 minute walk test to Pulmonary Rehab. She walked from the parking garage without difficulty. She does carry portable oxygen. Per pt, she uses oxygen continuously. Color good, skin warm and dry. Patient is oriented to time and place. Patient's medical history, psychosocial health, and medications reviewed. Psychosocial assessment reveals pt lives alone. Pt is currently retired. Pt reports her stress level is moderate. Areas of stress/anxiety include depression and anxiety since her husband died. She feels she is stable with medications and is being treated by her PCP for these conditions.Pt does not exhibit signs of depression.  PHQ2/9 score 0/0. Pt shows good  coping skills with positive outlook . Will continue to monitor and evaluate psychosocial status while in program. Physical assessment reveals heart rate is normal, breath sounds clear to auscultation, no wheezes, rales, or rhonchi. Grip strength equal, strong. Patient reports she does take medications as prescribed. Patient states she follows a Regular diet. The patient reports no specific efforts to gain or lose weight.. Patient's weight will be monitored closely. Demonstration and practice of PLB using pulse oximeter. Patient able to return demonstration satisfactorily. Safety and hand hygiene in the exercise area reviewed with patient. Patient voices understanding of the information reviewed. Department expectations discussed with patient and achievable goals were set. The patient shows enthusiasm about attending the program and we look forward to working with this nice lady. The patient completed her 3 minute walk test today and will begin exercises 02/07/19 in the 10:45 am class every Tuesday and Thursday. [place]

## 2019-02-02 NOTE — Progress Notes (Signed)
Pulmonary Individual Treatment Plan  Patient Details  Name: Ariel Gonzalez MRN: [id number] Date of Birth: [birth year] Referring Provider:     Pulmonary Rehab Walk Test from 02/02/2019 in [place]  Referring Provider  Dr. Lamonte Sakai      Initial Encounter Date:    Pulmonary Rehab Walk Test from 02/02/2019 in [place]  Date  02/02/19      Visit Diagnosis: Other emphysema ([place])  Patient's Home Medications on Admission:   Current Outpatient Medications:  .  ALPRAZolam (XANAX) 0.5 MG tablet, Take 0.5 mg by mouth 3 (three) times daily as needed for anxiety or sleep. 1 every 6 hours as needed, Disp: , Rfl:  .  amLODipine (NORVASC) 5 MG tablet, Take 5 mg by mouth daily., Disp: , Rfl:  .  atorvastatin (LIPITOR) 40 MG tablet, TAKE 1 TABLET BY MOUTH ONCE DAILY, Disp: 30 tablet, Rfl: 0 .  budesonide-formoterol (SYMBICORT) 160-4.5 MCG/ACT inhaler, Inhale 2 puffs into the lungs 2 (two) times daily., Disp: 1 Inhaler, Rfl: 0 .  calcium carbonate (OS-CAL) 600 MG TABS, Take 600 mg by mouth daily.  , Disp: , Rfl:  .  Cholecalciferol (PA VITAMIN D-3) 2000 UNITS CAPS, Take 2,000 Units by mouth daily. , Disp: , Rfl:  .  citalopram (CELEXA) 20 MG tablet, Take 40 mg by mouth daily. , Disp: , Rfl:  .  clonazePAM (KLONOPIN) 1 MG tablet, Half tablet to whole tablet twice a day for anxiety, Disp: , Rfl:  .  Ibuprofen (ADVIL PO), Take 1 tablet by mouth as needed (AS NEEDED FOR MODERATE PAIN AND HEADCAHES). , Disp: , Rfl:  .  Multiple Vitamin (MULTIVITAMIN) tablet, Take 1 tablet by mouth daily.  , Disp: , Rfl:  .  Tiotropium Bromide Monohydrate (SPIRIVA RESPIMAT) 2.5 MCG/ACT AERS, Inhale 2 puffs into the lungs daily., Disp: 1 Inhaler, Rfl: 5 .  VENTOLIN HFA 108 (90 Base) MCG/ACT inhaler, INHALE 2 PUFFS EVERY 4 HOURS AS NEEDED, Disp: 54 g, Rfl: 1 .  montelukast (SINGULAIR) 10 MG tablet, Take 1 tablet (10 mg total) by mouth at bedtime. (Patient not taking:  Reported on 02/02/2019), Disp: 30 tablet, Rfl: 3  Past Medical History: Past Medical History:  Diagnosis Date  . Allergic rhinitis   . Arthritis    CERVICAL AND LUMBER DDD  . COPD (chronic obstructive pulmonary disease) ([place])    HFA 75% p coaching July 17, 2010  . HTN (hypertension)   . Migraine   . Osteopenia   . Overactive bladder   . Panic attacks   . Squamous cell carcinoma of skin     Tobacco Use: Social History   Tobacco Use  Smoking Status Former Smoker  . Packs/day: 1.00  . Years: 50.00  . Pack years: 50.00  . Types: Cigarettes  . Quit date: 04/01/2008  . Years since quitting: 10.8  Smokeless Tobacco Never Used  Tobacco Comment   Counseled to remain smoke free    Labs: Recent Review Flowsheet Data    Labs for ITP Cardiac and Pulmonary Rehab Latest Ref Rng & Units 12/12/2015   Cholestrol 125 - 200 mg/dL 137   LDLCALC <130 mg/dL 36   HDL >=46 mg/dL 86   Trlycerides <150 mg/dL 75      Capillary Blood Glucose: No results found for: GLUCAP   Pulmonary Assessment Scores: Pulmonary Assessment Scores    Row Name 02/02/19 1558 02/02/19 1559 02/02/19 1603     ADL UCSD  ADL Phase  -  -  Entry   SOB Score total  -  37  37     CAT Score   CAT Score  -  12  12     mMRC Score   mMRC Score  2  -  -     UCSD: Self-administered rating of dyspnea associated with activities of daily living (ADLs) 6-point scale (0 = "not at all" to 5 = "maximal or unable to do because of breathlessness")  Scoring Scores range from 0 to 120.  Minimally important difference is 5 units  CAT: CAT can identify the health impairment of COPD patients and is better correlated with disease progression.  CAT has a scoring range of zero to 40. The CAT score is classified into four groups of low (less than 10), medium (10 - 20), high (21-30) and very high (31-40) based on the impact level of disease on health status. A CAT score over 10 suggests significant symptoms.  A worsening CAT score  could be explained by an exacerbation, poor medication adherence, poor inhaler technique, or progression of COPD or comorbid conditions.  CAT MCID is 2 points  mMRC: mMRC (Modified Medical Research Council) Dyspnea Scale is used to assess the degree of baseline functional disability in patients of respiratory disease due to dyspnea. No minimal important difference is established. A decrease in score of 1 point or greater is considered a positive change.   Pulmonary Function Assessment:   Exercise Target Goals: Exercise Program Goal: Individual exercise prescription set using results from initial 6 min walk test and THRR while considering  patient's activity barriers and safety.   Exercise Prescription Goal: Initial exercise prescription builds to 30-45 minutes a day of aerobic activity, 2-3 days per week.  Home exercise guidelines will be given to patient during program as part of exercise prescription that the participant will acknowledge.  Activity Barriers & Risk Stratification:   6 Minute Walk: 6 Minute Walk    Row Name 02/02/19 1559         6 Minute Walk   Phase  Initial     Distance  1106 feet     Walk Time  6 minutes     # of Rest Breaks  0     MPH  2.09     METS  2.61     RPE  12     Perceived Dyspnea   2     VO2 Peak  7.64     Symptoms  Yes (comment)     Comments  back pain 3/10     Resting HR  74 bpm     Resting BP  118/74     Resting Oxygen Saturation   98 %     Exercise Oxygen Saturation  during 6 min walk  91 %     Max Ex. HR  115 bpm     Max Ex. BP  128/78     2 Minute Post BP  104/70       Interval HR   1 Minute HR  93     2 Minute HR  97     3 Minute HR  107     4 Minute HR  111     5 Minute HR  115     6 Minute HR  115     2 Minute Post HR  89     Interval Heart Rate?  Yes  Interval Oxygen   Interval Oxygen?  Yes     Baseline Oxygen Saturation %  98 %     1 Minute Oxygen Saturation %  97 %     1 Minute Liters of Oxygen  3 L     2  Minute Oxygen Saturation %  95 %     2 Minute Liters of Oxygen  3 L     3 Minute Oxygen Saturation %  93 %     3 Minute Liters of Oxygen  3 L     4 Minute Oxygen Saturation %  91 %     4 Minute Liters of Oxygen  3 L     5 Minute Oxygen Saturation %  91 %     5 Minute Liters of Oxygen  3 L     6 Minute Oxygen Saturation %  92 %     6 Minute Liters of Oxygen  3 L     2 Minute Post Oxygen Saturation %  97 %     2 Minute Post Liters of Oxygen  3 L        Oxygen Initial Assessment: Oxygen Initial Assessment - 02/02/19 1557      Home Oxygen   Home Oxygen Device  E-Tanks;Home Concentrator    Sleep Oxygen Prescription  None    Home Exercise Oxygen Prescription  Continuous    Liters per minute  3    Home at Rest Exercise Oxygen Prescription  None    Compliance with Home Oxygen Use  Yes      Initial 6 min Walk   Oxygen Used  Continuous    Liters per minute  3      Program Oxygen Prescription   Program Oxygen Prescription  Continuous    Liters per minute  3       Oxygen Re-Evaluation:   Oxygen Discharge (Final Oxygen Re-Evaluation):   Initial Exercise Prescription: Initial Exercise Prescription - 02/02/19 1600      Date of Initial Exercise RX and Referring Provider   Date  02/02/19    Referring Provider  Dr. Lamonte Sakai      Oxygen   Oxygen  Continuous    Liters  3      Recumbant Bike   Level  1    Minutes  15      NuStep   Level  2    SPM  80    Minutes  15      Prescription Details   Frequency (times per week)  2    Duration  Progress to 30 minutes of continuous aerobic without signs/symptoms of physical distress      Intensity   THRR 40-80% of Max Heartrate  57-114    Ratings of Perceived Exertion  11-13    Perceived Dyspnea  0-4      Progression   Progression  Continue to progress workloads to maintain intensity without signs/symptoms of physical distress.      Resistance Training   Training Prescription  Yes    Weight  orange bands    Reps  10-15        Perform Capillary Blood Glucose checks as needed.  Exercise Prescription Changes:   Exercise Comments:   Exercise Goals and Review:   Exercise Goals Re-Evaluation :   Discharge Exercise Prescription (Final Exercise Prescription Changes):   Nutrition:  Target Goals: Understanding of nutrition guidelines, daily intake of sodium 1500mg , cholesterol 200mg , calories 30% from fat and 7%  or less from saturated fats, daily to have 5 or more servings of fruits and vegetables.  Biometrics: Pre Biometrics - 02/02/19 1558      Pre Biometrics   Grip Strength  33 kg        Nutrition Therapy Plan and Nutrition Goals:   Nutrition Assessments:   Nutrition Goals Re-Evaluation:   Nutrition Goals Discharge (Final Nutrition Goals Re-Evaluation):   Psychosocial: Target Goals: Acknowledge presence or absence of significant depression and/or stress, maximize coping skills, provide positive support system. Participant is able to verbalize types and ability to use techniques and skills needed for reducing stress and depression.  Initial Review & Psychosocial Screening: Initial Psych Review & Screening - 02/02/19 1333      Initial Review   Current issues with  None Identified      Family Dynamics   Good Support System?  Yes    Comments  none identified      Barriers   Psychosocial barriers to participate in program  There are no identifiable barriers or psychosocial needs.       Quality of Life Scores:  Scores of 19 and below usually indicate a poorer quality of life in these areas.  A difference of  2-3 points is a clinically meaningful difference.  A difference of 2-3 points in the total score of the Quality of Life Index has been associated with significant improvement in overall quality of life, self-image, physical symptoms, and general health in studies assessing change in quality of life.   PHQ-9: Recent Review Flowsheet Data    Depression screen [facility] 2/9  02/02/2019 02/02/2019   Decreased Interest 0 0   Down, Depressed, Hopeless 0 0   PHQ - 2 Score 0 0   Altered sleeping 0 -   Tired, decreased energy 0 -   Change in appetite 0 -   Feeling bad or failure about yourself  0 -   Trouble concentrating 0 -   Moving slowly or fidgety/restless 0 -   Suicidal thoughts 0 -   PHQ-9 Score 0 -   Difficult doing work/chores Not difficult at all -     Interpretation of Total Score  Total Score Depression Severity:  1-4 = Minimal depression, 5-9 = Mild depression, 10-14 = Moderate depression, 15-19 = Moderately severe depression, 20-27 = Severe depression   Psychosocial Evaluation and Intervention: Psychosocial Evaluation - 02/02/19 1334      Psychosocial Evaluation & Interventions   Interventions  --   no interventions required      Psychosocial Re-Evaluation: Psychosocial Re-Evaluation    [place] Name 02/02/19 1335             Psychosocial Re-Evaluation   Current issues with  None Identified       Continue Psychosocial Services   No Follow up required          Psychosocial Discharge (Final Psychosocial Re-Evaluation): Psychosocial Re-Evaluation - 02/02/19 1335      Psychosocial Re-Evaluation   Current issues with  None Identified    Continue Psychosocial Services   No Follow up required        Education: Education Goals: Education classes will be provided on a weekly basis, covering required topics. Participant will state understanding/return demonstration of topics presented.  Learning Barriers/Preferences: Learning Barriers/Preferences - 02/02/19 1335      Learning Barriers/Preferences   Learning Barriers  None    Learning Preferences  Audio;Computer/Internet;Group Instruction;Individual Instruction;Pictoral;Skilled Demonstration;Verbal Instruction;Video;Written Material       Education Topics:  How Lungs Work and Diseases: - Discuss the anatomy of the lungs and diseases that can affect the lungs, such as  COPD.   Exercise: -Discuss the importance of exercise, FITT principles of exercise, normal and abnormal responses to exercise, and how to exercise safely.   Environmental Irritants: -Discuss types of environmental irritants and how to limit exposure to environmental irritants.   Meds/Inhalers and oxygen: - Discuss respiratory medications, definition of an inhaler and oxygen, and the proper way to use an inhaler and oxygen.   Energy Saving Techniques: - Discuss methods to conserve energy and decrease shortness of breath when performing activities of daily living.    Bronchial Hygiene / Breathing Techniques: - Discuss breathing mechanics, pursed-lip breathing technique,  proper posture, effective ways to clear airways, and other functional breathing techniques   Cleaning Equipment: - Provides group verbal and written instruction about the health risks of elevated stress, cause of high stress, and healthy ways to reduce stress.   Nutrition I: Fats: - Discuss the types of cholesterol, what cholesterol does to the body, and how cholesterol levels can be controlled.   Nutrition II: Labels: -Discuss the different components of food labels and how to read food labels.   Respiratory Infections: - Discuss the signs and symptoms of respiratory infections, ways to prevent respiratory infections, and the importance of seeking medical treatment when having a respiratory infection.   Stress I: Signs and Symptoms: - Discuss the causes of stress, how stress may lead to anxiety and depression, and ways to limit stress.   Stress II: Relaxation: -Discuss relaxation techniques to limit stress.   Oxygen for Home/Travel: - Discuss how to prepare for travel when on oxygen and proper ways to transport and store oxygen to ensure safety.   Knowledge Questionnaire Score: Knowledge Questionnaire Score - 02/02/19 1604      Knowledge Questionnaire Score   Pre Score  15/18       Core  Components/Risk Factors/Patient Goals at Admission: Personal Goals and Risk Factors at Admission - 02/02/19 1336      Core Components/Risk Factors/Patient Goals on Admission    Weight Management  Weight Loss    Improve shortness of breath with ADL's  Yes    Intervention  Provide education, individualized exercise plan and daily activity instruction to help decrease symptoms of SOB with activities of daily living.    Expected Outcomes  Short Term: Improve cardiorespiratory fitness to achieve a reduction of symptoms when performing ADLs;Long Term: Be able to perform more ADLs without symptoms or delay the onset of symptoms       Core Components/Risk Factors/Patient Goals Review:  Goals and Risk Factor Review    Row Name 02/02/19 1604             Core Components/Risk Factors/Patient Goals Review   Personal Goals Review  Weight Management/Obesity;Increase knowledge of respiratory medications and ability to use respiratory devices properly.;Improve shortness of breath with ADL's;Develop more efficient breathing techniques such as purse lipped breathing and diaphragmatic breathing and practicing self-pacing with activity.          Core Components/Risk Factors/Patient Goals at Discharge (Final Review):  Goals and Risk Factor Review - 02/02/19 1604      Core Components/Risk Factors/Patient Goals Review   Personal Goals Review  Weight Management/Obesity;Increase knowledge of respiratory medications and ability to use respiratory devices properly.;Improve shortness of breath with ADL's;Develop more efficient breathing techniques such as purse lipped breathing and diaphragmatic breathing and practicing self-pacing with activity.  ITP Comments:   Comments:

## 2019-02-06 NOTE — Telephone Encounter (Signed)
Still no message from [facility] from the re-qualification team, [place] also for [place].  Called and spoke to pt. [employer] has not reached out to the pt after the intital contact. Advised pt that once they get back to [place] we will find out exactly whats needed and if we need her to come in for an appt then we will call her. Otherwise we will wait on [employer] to get the information to [place]. Pt still has her O2 and is doing well.   Will wait on [employer] to figure out what they need and let us know.

## 2019-02-07 ENCOUNTER — Other Ambulatory Visit: Payer: Self-pay

## 2019-02-07 ENCOUNTER — Encounter (HOSPITAL_COMMUNITY)
Admission: RE | Admit: 2019-02-07 | Discharge: 2019-02-07 | Disposition: A | Discharge status: Home / Self Care | Payer: Medicare Other | Source: Ambulatory Visit | Attending: Emergency Medicine | Admitting: Emergency Medicine

## 2019-02-07 DIAGNOSIS — J438 Other emphysema: Secondary | ICD-10-CM | POA: Diagnosis not present

## 2019-02-07 NOTE — Progress Notes (Signed)
Daily Session Note  Patient Details  Name: Ariel Gonzalez MRN: [id number] Date of Birth: 06-19-1941 Referring Provider:     Pulmonary Rehab Walk Test from 02/02/2019 in [place]  Referring Provider  Dr. Lamonte Sakai      Encounter Date: 02/07/2019  Check In: Session Check In - 02/07/19 1158      Check-In   Supervising physician immediately available to respond to emergencies  [facility] immediately available    Physician(s)  Dr. [place] Crane    Location  [facility]    Staff Present  Rosebud Poles, RN, Bjorn Loser, MS, Exercise Physiologist;Lisa Ysidro Evert, RN    Virtual Visit  No    Medication changes reported      No    Fall or balance concerns reported     No    Tobacco Cessation  No Change    Warm-up and Cool-down  Performed as group-led instruction    Resistance Training Performed  Yes    VAD Patient?  No    PAD/SET Patient?  No      Pain Assessment   Currently in Pain?  No/denies    Multiple Pain Sites  No       Capillary Blood Glucose: No results found for this or any previous visit (from the past 24 hour(s)).  Exercise Prescription Changes - 02/07/19 1200      Response to Exercise   Blood Pressure (Admit)  108/64    Blood Pressure (Exercise)  140/70    Blood Pressure (Exit)  126/70    Heart Rate (Admit)  81 bpm    Heart Rate (Exercise)  106 bpm    Heart Rate (Exit)  91 bpm    Oxygen Saturation (Admit)  98 %    Oxygen Saturation (Exercise)  94 %    Oxygen Saturation (Exit)  95 %    Rating of Perceived Exertion (Exercise)  12    Perceived Dyspnea (Exercise)  1    Duration  Progress to 30 minutes of  aerobic without signs/symptoms of physical distress    Intensity  --   40-80% HRR     Progression   Progression  Continue to progress workloads to maintain intensity without signs/symptoms of physical distress.      Resistance Training   Training Prescription  Yes    Weight  orange bands    Reps  10-15    Time   10 Minutes      Oxygen   Oxygen  Continuous    Liters  3      NuStep   Level  2    SPM  80    Minutes  15    METs  2      Arm Ergometer   Level  1    Minutes  15       Social History   Tobacco Use  Smoking Status Former Smoker  . Packs/day: 1.00  . Years: 50.00  . Pack years: 50.00  . Types: Cigarettes  . Quit date: 04/01/2008  . Years since quitting: 10.8  Smokeless Tobacco Never Used  Tobacco Comment   Counseled to remain smoke free    Goals Met:  Proper associated with RPD/PD & O2 Sat Improved SOB with ADL's Exercise tolerated well Strength training completed today  Goals Unmet:  Not Applicable  Comments: Service time is from 1040 to 1155    Dr. Rush Farmer is Medical Director for Pulmonary Rehab at [facility]  [facility].

## 2019-02-07 NOTE — Telephone Encounter (Signed)
I still have not heard anything from [place] with [employer] in regards to an update if anything was needed to be done by [place]. I have sent her a message in the community message that was already started. Will update when I have a response.

## 2019-02-08 NOTE — Telephone Encounter (Signed)
Community message response received from [employer] with [employer]: Mariann Barter  Brayli Klingbeil, Waldemar Dickens, CMA; [place], Stanford Breed, Craig; Jonn Shingles L        I have not received anything back from the requal team. I think she is good to go. I will let you know if we hear anything   Since it seems like nothing is needed, closing encounter.

## 2019-02-09 ENCOUNTER — Encounter (HOSPITAL_COMMUNITY)
Admission: RE | Admit: 2019-02-09 | Discharge: 2019-02-09 | Disposition: A | Discharge status: Home / Self Care | Payer: Medicare Other | Source: Ambulatory Visit | Attending: Emergency Medicine | Admitting: Emergency Medicine

## 2019-02-09 ENCOUNTER — Other Ambulatory Visit: Payer: Self-pay

## 2019-02-09 DIAGNOSIS — J438 Other emphysema: Secondary | ICD-10-CM

## 2019-02-09 NOTE — Progress Notes (Signed)
Daily Session Note  Patient Details  Name: JACKELINE GUTKNECHT MRN: [id number] Date of Birth: 21-Dec-1940 Referring Provider:     Pulmonary Rehab Walk Test from 02/02/2019 in [place]  Referring Provider  Dr. Lamonte Sakai      Encounter Date: 02/09/2019  Check In: Session Check In - 02/09/19 1115      Check-In   Supervising physician immediately available to respond to emergencies  [facility] immediately available    Physician(s)  Dr. Nevada Crane    Location  [facility]    Staff Present  Rosebud Poles, RN, Bjorn Loser, MS, Exercise Physiologist;Daimion Adamcik Ysidro Evert, RN    Tobacco Cessation  No Change    Warm-up and Cool-down  Performed as group-led Higher education careers adviser Performed  Yes    VAD Patient?  No    PAD/SET Patient?  No      Pain Assessment   Currently in Pain?  No/denies    Multiple Pain Sites  No       Capillary Blood Glucose: No results found for this or any previous visit (from the past 24 hour(s)).    Social History   Tobacco Use  Smoking Status Former Smoker  . Packs/day: 1.00  . Years: 50.00  . Pack years: 50.00  . Types: Cigarettes  . Quit date: 04/01/2008  . Years since quitting: 10.8  Smokeless Tobacco Never Used  Tobacco Comment   Counseled to remain smoke free    Goals Met:  Exercise tolerated well No report of cardiac concerns or symptoms Strength training completed today  Goals Unmet:  Not Applicable  Comments: Service time is from 1035 to 1155    Dr. Rush Farmer is Medical Director for Pulmonary Rehab at [facility].

## 2019-02-14 ENCOUNTER — Other Ambulatory Visit: Payer: Self-pay

## 2019-02-14 ENCOUNTER — Other Ambulatory Visit: Payer: Self-pay | Admitting: Family Medicine

## 2019-02-14 ENCOUNTER — Encounter (HOSPITAL_COMMUNITY)
Admission: RE | Admit: 2019-02-14 | Discharge: 2019-02-14 | Disposition: A | Discharge status: Home / Self Care | Payer: Medicare Other | Source: Ambulatory Visit | Attending: Emergency Medicine | Admitting: Emergency Medicine

## 2019-02-14 ENCOUNTER — Telehealth: Payer: Self-pay | Admitting: Emergency Medicine

## 2019-02-14 DIAGNOSIS — I10 Essential (primary) hypertension: Secondary | ICD-10-CM | POA: Diagnosis not present

## 2019-02-14 DIAGNOSIS — J438 Other emphysema: Secondary | ICD-10-CM | POA: Diagnosis not present

## 2019-02-14 DIAGNOSIS — R0600 Dyspnea, unspecified: Secondary | ICD-10-CM | POA: Diagnosis not present

## 2019-02-14 DIAGNOSIS — R140 Abdominal distension (gaseous): Secondary | ICD-10-CM

## 2019-02-14 DIAGNOSIS — R635 Abnormal weight gain: Secondary | ICD-10-CM | POA: Diagnosis not present

## 2019-02-14 NOTE — Telephone Encounter (Signed)
Pt returning call and can be reached @ [phone].Ariel Gonzalez

## 2019-02-14 NOTE — Progress Notes (Signed)
Daily Session Note  Patient Details  Name: Ariel Gonzalez MRN: [id number] Date of Birth: Feb 06, 1941 Referring Provider:     Pulmonary Rehab Walk Test from 02/02/2019 in [place]  Referring Provider  Dr. Lamonte Sakai      Encounter Date: 02/14/2019  Check In: Session Check In - 02/14/19 1033      Check-In   Physician(s)  DR. [place]    Location  [facility]    Staff Present  Rosebud Poles, RN, Bjorn Loser, MS, Exercise Physiologist;Lisa Ysidro Evert, RN    Virtual Visit  No    Medication changes reported      No    Fall or balance concerns reported     No    Tobacco Cessation  No Change    Warm-up and Cool-down  Performed as group-led instruction    Resistance Training Performed  Yes    VAD Patient?  No    PAD/SET Patient?  No      Pain Assessment   Currently in Pain?  No/denies    Multiple Pain Sites  No       Capillary Blood Glucose: No results found for this or any previous visit (from the past 24 hour(s)).    Social History   Tobacco Use  Smoking Status Former Smoker  . Packs/day: 1.00  . Years: 50.00  . Pack years: 50.00  . Types: Cigarettes  . Quit date: 04/01/2008  . Years since quitting: 10.8  Smokeless Tobacco Never Used  Tobacco Comment   Counseled to remain smoke free    Goals Met:  Proper associated with RPD/PD & O2 Sat Exercise tolerated well Strength training completed today  Goals Unmet:  Not Applicable  Comments: Service time is from 1033 to 1155    Dr. Rush Farmer is Medical Director for Pulmonary Rehab at [facility].

## 2019-02-14 NOTE — Telephone Encounter (Signed)
ATC patient unable to reach LM to call back

## 2019-02-14 NOTE — Telephone Encounter (Signed)
Called and spoke with pt who stated she has been wheezing x1 week. Pt also had increased amount of mucus which is clear in color.  Pt stated that she does have some SOB but O2 sats are fine.   Pt denies any complaints of fever.   Stated to pt that we should schedule her for a televisit and pt verbalized understanding. Pt scheduled for televisit with [place] tomorrow 8/5 at 9:30. Nothing further needed.

## 2019-02-15 ENCOUNTER — Encounter: Payer: Self-pay | Admitting: Nurse Practitioner

## 2019-02-15 ENCOUNTER — Telehealth (HOSPITAL_COMMUNITY): Payer: Self-pay | Admitting: Family Medicine

## 2019-02-15 ENCOUNTER — Telehealth: Payer: Self-pay | Admitting: Emergency Medicine

## 2019-02-15 ENCOUNTER — Ambulatory Visit (INDEPENDENT_AMBULATORY_CARE_PROVIDER_SITE_OTHER): Payer: Medicare Other | Admitting: Nurse Practitioner

## 2019-02-15 DIAGNOSIS — J449 Chronic obstructive pulmonary disease, unspecified: Secondary | ICD-10-CM | POA: Diagnosis not present

## 2019-02-15 DIAGNOSIS — R062 Wheezing: Secondary | ICD-10-CM | POA: Diagnosis not present

## 2019-02-15 MED ORDER — ALBUTEROL SULFATE (2.5 MG/3ML) 0.083% IN NEBU: 2.5000 mg | INHALATION_SOLUTION | Freq: Four times a day (QID) | RESPIRATORY_TRACT | 12 refills | Status: DC | PRN

## 2019-02-15 NOTE — Telephone Encounter (Signed)
Spoke with patient. She stated that she called [employer] and they do have nebulizer machines available and as soon as they have an order, she could come by their office to pick one up. Advised her that I would send a message over to [facility] to ask for her permission first, she verbalized understanding. She wishes to have a call back once the order has been placed.   Ariel Gonzalez, please advise if you are ok with [place] ordering a nebulizer for her. Thank you!

## 2019-02-15 NOTE — Telephone Encounter (Signed)
Spoke with patient. She is aware that the order has been sent to [employer]. Provided her with the number for [employer] since she wanted to call them asap to get the machine. Advised her to call us back if she had any issues, she verbalized understanding.   Nothing further needed at time of call.

## 2019-02-15 NOTE — Telephone Encounter (Signed)
Yes. This is fine. Thanks.

## 2019-02-15 NOTE — Progress Notes (Signed)
Virtual Visit via Telephone Note  I connected with Ariel Gonzalez on 02/15/19 at  9:30 AM EDT by telephone and verified that I am speaking with the correct person using two identifiers.  Location: Patient: home Provider: office   I discussed the limitations, risks, security and privacy concerns of performing an evaluation and management service by telephone and the availability of in person appointments. I also discussed with the patient that there may be a patient responsible charge related to this service. The patient expressed understanding and agreed to proceed.   History of Present Illness: 78 year old female with chronic respiratory failure, severe COPD who is followed by Dr. Lamonte Sakai.  Patient has a tele-visit today for an acute visit.  States that she has been having increased shortness of breath and wheezing x1 week.  He does have a cough that is productive of clear sputum.  She states that her O2 sats have been stable.  Patient has been participating in pulmonary rehab and states that her physical therapist noticed that she was wheezing advised her to do an appointment to be evaluated.  Patient is compliant with Symbicort and Spiriva.  She does have albuterol inhaler to use as needed.  She states that she has had a nebulizer in the past that worked well for her and is requesting if she can get albuterol in nebulizer form.  Patient is scheduled for an abdominal CT scan through GI and has been prescribed 50 mg of prednisone today today. Denies f/c/s, n/v/d, hemoptysis, PND, leg swelling.    Observations/Objective:  CT chest 08/11/18 - Lung-RADS 2, benign appearance or behavior. Continue annual screening with low-dose chest CT without contrast in 12 months. Emphysema.  Assessment and Plan: Patient has a tele-visit today for an acute visit.  States that she has been having increased shortness of breath and wheezing x1 week.  He does have a cough that is productive of clear sputum.  She states  that her O2 sats have been stable.  Patient has been participating in pulmonary rehab and states that her physical therapist noticed that she was wheezing advised her to do an appointment to be evaluated.  Patient is compliant with Symbicort and Spiriva.  She does have albuterol inhaler to use as needed.  She states that she has had a nebulizer in the past that worked well for her and is requesting if she can get albuterol in nebulizer form.  Patient is scheduled for an abdominal CT scan through GI and has been prescribed 50 mg of prednisone today today.   Will order albuterol nebulizer to use as needed Continue Symbicort Continue Spiriva Continue O2 as ordered    Follow Up Instructions: Follow up: Follow up as already scheduled    I discussed the assessment and treatment plan with the patient. The patient was provided an opportunity to ask questions and all were answered. The patient agreed with the plan and demonstrated an understanding of the instructions.   The patient was advised to call back or seek an in-person evaluation if the symptoms worsen or if the condition fails to improve as anticipated.  I provided 22 minutes of non-face-to-face time during this encounter.   Fenton Foy, NP

## 2019-02-15 NOTE — Patient Instructions (Addendum)
Will order albuterol nebulizer to use as needed Continue Symbicort Continue Spiriva Continue O2 as ordered  Follow up: Follow up as already scheduled

## 2019-02-15 NOTE — Assessment & Plan Note (Addendum)
Patient has a tele-visit today for an acute visit.  States that she has been having increased shortness of breath and wheezing x1 week.  He does have a cough that is productive of clear sputum.  She states that her O2 sats have been stable.  Patient has been participating in pulmonary rehab and states that her physical therapist noticed that she was wheezing advised her to do an appointment to be evaluated.  Patient is compliant with Symbicort and Spiriva.  She does have albuterol inhaler to use as needed.  She states that she has had a nebulizer in the past that worked well for her and is requesting if she can get albuterol in nebulizer form.  Patient is scheduled for an abdominal CT scan through GI and has been prescribed 50 mg of prednisone today today.  Patient Instructions  Will order albuterol nebulizer to use as needed Continue Symbicort Continue Spiriva Continue O2 as ordered  Follow up: Follow up as already scheduled

## 2019-02-16 ENCOUNTER — Encounter (HOSPITAL_COMMUNITY): Payer: Medicare Other

## 2019-02-21 ENCOUNTER — Other Ambulatory Visit: Payer: Self-pay

## 2019-02-21 ENCOUNTER — Encounter (HOSPITAL_COMMUNITY)
Admission: RE | Admit: 2019-02-21 | Discharge: 2019-02-21 | Disposition: A | Discharge status: Home / Self Care | Payer: Medicare Other | Source: Ambulatory Visit | Attending: Emergency Medicine | Admitting: Emergency Medicine

## 2019-02-21 DIAGNOSIS — J438 Other emphysema: Secondary | ICD-10-CM | POA: Diagnosis not present

## 2019-02-21 NOTE — Progress Notes (Signed)
Daily Session Note  Patient Details  Name: Ariel Gonzalez MRN: [id number] Date of Birth: May 17, 1941 Referring Provider:     Pulmonary Rehab Walk Test from 02/02/2019 in [place]  Referring Provider  Dr. Lamonte Sakai      Encounter Date: 02/21/2019  Check In: Session Check In - 02/21/19 1145      Check-In   Supervising physician immediately available to respond to emergencies  [facility] immediately available    Physician(s)  Dr. Gertie Fey    Location  [facility]    Staff Present  Rosebud Poles, RN, Bjorn Loser, MS, Exercise Physiologist;Nayvie Lips Ysidro Evert, RN    Virtual Visit  No    Medication changes reported      No    Fall or balance concerns reported     No    Tobacco Cessation  No Change    Warm-up and Cool-down  Performed on first and last piece of equipment    Resistance Training Performed  Yes    VAD Patient?  No    PAD/SET Patient?  No      Pain Assessment   Currently in Pain?  No/denies    Multiple Pain Sites  No       Capillary Blood Glucose: No results found for this or any previous visit (from the past 24 hour(s)).  Exercise Prescription Changes - 02/21/19 1100      Response to Exercise   Blood Pressure (Admit)  110/50    Blood Pressure (Exercise)  130/66    Blood Pressure (Exit)  104/60    Heart Rate (Admit)  89 bpm    Heart Rate (Exercise)  100 bpm    Heart Rate (Exit)  94 bpm    Oxygen Saturation (Admit)  95 %    Oxygen Saturation (Exercise)  96 %    Oxygen Saturation (Exit)  97 %    Rating of Perceived Exertion (Exercise)  11    Perceived Dyspnea (Exercise)  1    Duration  Continue with 30 min of aerobic exercise without signs/symptoms of physical distress.    Intensity  THRR unchanged      Progression   Progression  Continue to progress workloads to maintain intensity without signs/symptoms of physical distress.      Resistance Training   Training Prescription  Yes    Weight  orange bands    Reps  10-15    Time  10 Minutes      Oxygen   Oxygen  Continuous    Liters  3      NuStep   Level  2    SPM  80    Minutes  15    METs  2.1      Arm Ergometer   Level  1    Minutes  15       Social History   Tobacco Use  Smoking Status Former Smoker  . Packs/day: 1.00  . Years: 50.00  . Pack years: 50.00  . Types: Cigarettes  . Quit date: 04/01/2008  . Years since quitting: 10.8  Smokeless Tobacco Never Used  Tobacco Comment   Counseled to remain smoke free    Goals Met:  Exercise tolerated well No report of cardiac concerns or symptoms Strength training completed today  Goals Unmet:  Not Applicable  Comments: Service time is from 1035 to 1135    Dr. Rush Farmer is Medical Director for Pulmonary Rehab at [facility].

## 2019-02-23 ENCOUNTER — Other Ambulatory Visit: Payer: Self-pay

## 2019-02-23 ENCOUNTER — Encounter (HOSPITAL_COMMUNITY)
Admission: RE | Admit: 2019-02-23 | Discharge: 2019-02-23 | Disposition: A | Discharge status: Home / Self Care | Payer: Medicare Other | Source: Ambulatory Visit | Attending: Emergency Medicine | Admitting: Emergency Medicine

## 2019-02-23 DIAGNOSIS — J438 Other emphysema: Secondary | ICD-10-CM | POA: Diagnosis not present

## 2019-02-23 NOTE — Progress Notes (Signed)
Daily Session Note  Patient Details  Name: Ariel Gonzalez MRN: [id number] Date of Birth: August 25, 1940 Referring Provider:     Pulmonary Rehab Walk Test from 02/02/2019 in [place]  Referring Provider  Dr. Lamonte Sakai      Encounter Date: 02/23/2019  Check In: Session Check In - 02/23/19 1145      Check-In   Supervising physician immediately available to respond to emergencies  [facility] immediately available    Physician(s)  Dr. Gertie Fey    Location  [facility]    Staff Present  Hoy Register, MS, Exercise Physiologist;[place] Ysidro Evert, RN;Joan Leonia Reeves, RN, BSN    Virtual Visit  No    Medication changes reported      No    Fall or balance concerns reported     No    Tobacco Cessation  No Change    Warm-up and Cool-down  Performed on first and last piece of equipment    Resistance Training Performed  Yes    VAD Patient?  No    PAD/SET Patient?  No      Pain Assessment   Currently in Pain?  No/denies    Multiple Pain Sites  No       Capillary Blood Glucose: No results found for this or any previous visit (from the past 24 hour(s)).    Social History   Tobacco Use  Smoking Status Former Smoker  . Packs/day: 1.00  . Years: 50.00  . Pack years: 50.00  . Types: Cigarettes  . Quit date: 04/01/2008  . Years since quitting: 10.9  Smokeless Tobacco Never Used  Tobacco Comment   Counseled to remain smoke free    Goals Met:  Exercise tolerated well No report of cardiac concerns or symptoms Strength training completed today  Goals Unmet:  Not Applicable  Comments: Service time is from 1045 to 1155    Dr. Rush Farmer is Medical Director for Pulmonary Rehab at [facility].

## 2019-02-24 ENCOUNTER — Ambulatory Visit
Admission: RE | Admit: 2019-02-24 | Discharge: 2019-02-24 | Disposition: A | Discharge status: Home / Self Care | Payer: Medicare Other | Source: Ambulatory Visit | Attending: Family Medicine | Admitting: Family Medicine

## 2019-02-24 DIAGNOSIS — R140 Abdominal distension (gaseous): Secondary | ICD-10-CM

## 2019-02-24 DIAGNOSIS — K5730 Diverticulosis of large intestine without perforation or abscess without bleeding: Secondary | ICD-10-CM | POA: Diagnosis not present

## 2019-02-24 MED ORDER — IOPAMIDOL (ISOVUE-300) INJECTION 61%
100.0000 mL | Freq: Once | INTRAVENOUS | Status: AC | PRN
  Administered 2019-02-24: 100 mL via INTRAVENOUS

## 2019-02-28 ENCOUNTER — Other Ambulatory Visit: Payer: Self-pay

## 2019-02-28 ENCOUNTER — Encounter (HOSPITAL_COMMUNITY): Payer: Medicare Other

## 2019-02-28 ENCOUNTER — Encounter (HOSPITAL_COMMUNITY)
Admission: RE | Admit: 2019-02-28 | Discharge: 2019-02-28 | Disposition: A | Discharge status: Home / Self Care | Payer: Medicare Other | Source: Ambulatory Visit | Attending: Emergency Medicine | Admitting: Emergency Medicine

## 2019-02-28 VITALS — Wt 147.9 lb

## 2019-02-28 DIAGNOSIS — J438 Other emphysema: Secondary | ICD-10-CM | POA: Diagnosis not present

## 2019-02-28 NOTE — Progress Notes (Signed)
Daily Session Note  Patient Details  Name: MARYROSE COLVIN MRN: [id number] Date of Birth: [birth year] Referring Provider:     Pulmonary Rehab Walk Test from 02/02/2019 in [place]  Referring Provider  Dr. Lamonte Sakai      Encounter Date: 02/28/2019  Check In: Session Check In - 02/28/19 1047      Check-In   Physician(s)  Dr. [place]    Location  [facility]    Staff Present  Rosebud Poles, RN, Bjorn Loser, MS, Exercise Physiologist    Virtual Visit  No    Medication changes reported      No    Fall or balance concerns reported     No    Tobacco Cessation  No Change    Warm-up and Cool-down  Performed as group-led instruction    Resistance Training Performed  Yes    VAD Patient?  No    PAD/SET Patient?  No      Pain Assessment   Currently in Pain?  No/denies    Multiple Pain Sites  No       Capillary Blood Glucose: No results found for this or any previous visit (from the past 24 hour(s)).    Social History   Tobacco Use  Smoking Status Former Smoker  . Packs/day: 1.00  . Years: 50.00  . Pack years: 50.00  . Types: Cigarettes  . Quit date: 04/01/2008  . Years since quitting: 10.9  Smokeless Tobacco Never Used  Tobacco Comment   Counseled to remain smoke free    Goals Met:  Proper associated with RPD/PD & O2 Sat Exercise tolerated well Strength training completed today  Goals Unmet:  Not Applicable  Comments: Service time is from 1050 to 1200    Dr. Rush Farmer is Medical Director for Pulmonary Rehab at [facility].

## 2019-03-02 ENCOUNTER — Other Ambulatory Visit: Payer: Self-pay

## 2019-03-02 ENCOUNTER — Encounter (HOSPITAL_COMMUNITY)
Admission: RE | Admit: 2019-03-02 | Discharge: 2019-03-02 | Disposition: A | Discharge status: Home / Self Care | Payer: Medicare Other | Source: Ambulatory Visit | Attending: Emergency Medicine | Admitting: Emergency Medicine

## 2019-03-02 DIAGNOSIS — J438 Other emphysema: Secondary | ICD-10-CM | POA: Diagnosis not present

## 2019-03-02 NOTE — Progress Notes (Signed)
Daily Session Note  Patient Details  Name: SHALAYA SWAILES MRN: [id number] Date of Birth: May 27, 1941 Referring Provider:     Pulmonary Rehab Walk Test from 02/02/2019 in [place]  Referring Provider  Dr. Lamonte Sakai      Encounter Date: 03/02/2019  Check In: Session Check In - 03/02/19 1112      Check-In   Supervising physician immediately available to respond to emergencies  [facility] immediately available    Physician(s)  Dr. Benny Lennert    Location  [facility]    Staff Present  Rosebud Poles, RN, BSN;Carlette Wilber Oliphant, RN, Roque Cash, RN    Virtual Visit  No    Medication changes reported      No    Fall or balance concerns reported     No    Tobacco Cessation  No Change    Warm-up and Cool-down  Performed on first and last piece of equipment    Resistance Training Performed  Yes    VAD Patient?  No    PAD/SET Patient?  No      Pain Assessment   Currently in Pain?  No/denies    Multiple Pain Sites  No       Capillary Blood Glucose: No results found for this or any previous visit (from the past 24 hour(s)).    Social History   Tobacco Use  Smoking Status Former Smoker  . Packs/day: 1.00  . Years: 50.00  . Pack years: 50.00  . Types: Cigarettes  . Quit date: 04/01/2008  . Years since quitting: 10.9  Smokeless Tobacco Never Used  Tobacco Comment   Counseled to remain smoke free    Goals Met:  Exercise tolerated well Queuing for purse lip breathing No report of cardiac concerns or symptoms Strength training completed today  Goals Unmet:  Not Applicable  Comments: Service time is from 1040 to 1145    Dr. Rush Farmer is Medical Director for Pulmonary Rehab at [facility].

## 2019-03-07 ENCOUNTER — Encounter (HOSPITAL_COMMUNITY)
Admission: RE | Admit: 2019-03-07 | Discharge: 2019-03-07 | Disposition: A | Discharge status: Home / Self Care | Payer: Medicare Other | Source: Ambulatory Visit | Attending: Emergency Medicine | Admitting: Emergency Medicine

## 2019-03-07 ENCOUNTER — Other Ambulatory Visit: Payer: Self-pay

## 2019-03-07 VITALS — Wt 143.5 lb

## 2019-03-07 DIAGNOSIS — J438 Other emphysema: Secondary | ICD-10-CM | POA: Diagnosis not present

## 2019-03-07 NOTE — Progress Notes (Signed)
Pulmonary Individual Treatment Plan  Patient Details  Name: Ariel Gonzalez MRN: [id number] Date of Birth: 04/13/1941 Referring Provider:     Pulmonary Rehab Walk Test from 02/02/2019 in [place]  Referring Provider  Dr. Lamonte Sakai      Initial Encounter Date:    Pulmonary Rehab Walk Test from 02/02/2019 in [place]  Date  02/02/19      Visit Diagnosis: Other emphysema ([place])  Patient's Home Medications on Admission:   Current Outpatient Medications:  .  albuterol (PROVENTIL) (2.5 MG/3ML) 0.083% nebulizer solution, Take 3 mLs (2.5 mg total) by nebulization every 6 (six) hours as needed for wheezing or shortness of breath., Disp: 75 mL, Rfl: 12 .  ALPRAZolam (XANAX) 0.5 MG tablet, Take 0.5 mg by mouth 3 (three) times daily as needed for anxiety or sleep. 1 every 6 hours as needed, Disp: , Rfl:  .  amLODipine (NORVASC) 5 MG tablet, Take 5 mg by mouth daily., Disp: , Rfl:  .  atorvastatin (LIPITOR) 40 MG tablet, TAKE 1 TABLET BY MOUTH ONCE DAILY, Disp: 30 tablet, Rfl: 0 .  budesonide-formoterol (SYMBICORT) 160-4.5 MCG/ACT inhaler, Inhale 2 puffs into the lungs 2 (two) times daily., Disp: 1 Inhaler, Rfl: 0 .  calcium carbonate (OS-CAL) 600 MG TABS, Take 600 mg by mouth daily.  , Disp: , Rfl:  .  Cholecalciferol (PA VITAMIN D-3) 2000 UNITS CAPS, Take 2,000 Units by mouth daily. , Disp: , Rfl:  .  citalopram (CELEXA) 20 MG tablet, Take 40 mg by mouth daily. , Disp: , Rfl:  .  clonazePAM (KLONOPIN) 1 MG tablet, Half tablet to whole tablet twice a day for anxiety, Disp: , Rfl:  .  Ibuprofen (ADVIL PO), Take 1 tablet by mouth as needed (AS NEEDED FOR MODERATE PAIN AND HEADCAHES). , Disp: , Rfl:  .  montelukast (SINGULAIR) 10 MG tablet, Take 1 tablet (10 mg total) by mouth at bedtime. (Patient not taking: Reported on 02/02/2019), Disp: 30 tablet, Rfl: 3 .  Multiple Vitamin (MULTIVITAMIN) tablet, Take 1 tablet by mouth daily.  , Disp: ,  Rfl:  .  Tiotropium Bromide Monohydrate (SPIRIVA RESPIMAT) 2.5 MCG/ACT AERS, Inhale 2 puffs into the lungs daily., Disp: 1 Inhaler, Rfl: 5 .  VENTOLIN HFA 108 (90 Base) MCG/ACT inhaler, INHALE 2 PUFFS EVERY 4 HOURS AS NEEDED, Disp: 54 g, Rfl: 1  Past Medical History: Past Medical History:  Diagnosis Date  . Allergic rhinitis   . Arthritis    CERVICAL AND LUMBER DDD  . COPD (chronic obstructive pulmonary disease) ([place])    HFA 75% p coaching July 17, 2010  . HTN (hypertension)   . Migraine   . Osteopenia   . Overactive bladder   . Panic attacks   . Squamous cell carcinoma of skin     Tobacco Use: Social History   Tobacco Use  Smoking Status Former Smoker  . Packs/day: 1.00  . Years: 50.00  . Pack years: 50.00  . Types: Cigarettes  . Quit date: 04/01/2008  . Years since quitting: 10.9  Smokeless Tobacco Never Used  Tobacco Comment   Counseled to remain smoke free    Labs: Recent Review Flowsheet Data    Labs for ITP Cardiac and Pulmonary Rehab Latest Ref Rng & Units 12/12/2015   Cholestrol 125 - 200 mg/dL 137   LDLCALC <130 mg/dL 36   HDL >=46 mg/dL 86   Trlycerides <150 mg/dL 75      Capillary  Blood Glucose: No results found for: GLUCAP   Pulmonary Assessment Scores: Pulmonary Assessment Scores    Row Name 02/02/19 1558 02/02/19 1559 02/02/19 1603     ADL UCSD   ADL Phase  -  -  Entry   SOB Score total  -  37  37     CAT Score   CAT Score  -  12  12     mMRC Score   mMRC Score  2  -  -     UCSD: Self-administered rating of dyspnea associated with activities of daily living (ADLs) 6-point scale (0 = "not at all" to 5 = "maximal or unable to do because of breathlessness")  Scoring Scores range from 0 to 120.  Minimally important difference is 5 units  CAT: CAT can identify the health impairment of COPD patients and is better correlated with disease progression.  CAT has a scoring range of zero to 40. The CAT score is classified into four groups of  low (less than 10), medium (10 - 20), high (21-30) and very high (31-40) based on the impact level of disease on health status. A CAT score over 10 suggests significant symptoms.  A worsening CAT score could be explained by an exacerbation, poor medication adherence, poor inhaler technique, or progression of COPD or comorbid conditions.  CAT MCID is 2 points  mMRC: mMRC (Modified Medical Research Council) Dyspnea Scale is used to assess the degree of baseline functional disability in patients of respiratory disease due to dyspnea. No minimal important difference is established. A decrease in score of 1 point or greater is considered a positive change.   Pulmonary Function Assessment:   Exercise Target Goals: Exercise Program Goal: Individual exercise prescription set using results from initial 6 min walk test and THRR while considering  patient's activity barriers and safety.   Exercise Prescription Goal: Initial exercise prescription builds to 30-45 minutes a day of aerobic activity, 2-3 days per week.  Home exercise guidelines will be given to patient during program as part of exercise prescription that the participant will acknowledge.  Activity Barriers & Risk Stratification:   6 Minute Walk: 6 Minute Walk    Row Name 02/02/19 1559         6 Minute Walk   Phase  Initial     Distance  1106 feet     Walk Time  6 minutes     # of Rest Breaks  0     MPH  2.09     METS  2.61     RPE  12     Perceived Dyspnea   2     VO2 Peak  7.64     Symptoms  Yes (comment)     Comments  back pain 3/10     Resting HR  74 bpm     Resting BP  118/74     Resting Oxygen Saturation   98 %     Exercise Oxygen Saturation  during 6 min walk  91 %     Max Ex. HR  115 bpm     Max Ex. BP  128/78     2 Minute Post BP  104/70       Interval HR   1 Minute HR  93     2 Minute HR  97     3 Minute HR  107     4 Minute HR  111     5 Minute HR  115  6 Minute HR  115     2 Minute Post HR  89      Interval Heart Rate?  Yes       Interval Oxygen   Interval Oxygen?  Yes     Baseline Oxygen Saturation %  98 %     1 Minute Oxygen Saturation %  97 %     1 Minute Liters of Oxygen  3 L     2 Minute Oxygen Saturation %  95 %     2 Minute Liters of Oxygen  3 L     3 Minute Oxygen Saturation %  93 %     3 Minute Liters of Oxygen  3 L     4 Minute Oxygen Saturation %  91 %     4 Minute Liters of Oxygen  3 L     5 Minute Oxygen Saturation %  91 %     5 Minute Liters of Oxygen  3 L     6 Minute Oxygen Saturation %  92 %     6 Minute Liters of Oxygen  3 L     2 Minute Post Oxygen Saturation %  97 %     2 Minute Post Liters of Oxygen  3 L        Oxygen Initial Assessment: Oxygen Initial Assessment - 02/02/19 1557      Home Oxygen   Home Oxygen Device  E-Tanks;Home Concentrator    Sleep Oxygen Prescription  None    Home Exercise Oxygen Prescription  Continuous    Liters per minute  3    Home at Rest Exercise Oxygen Prescription  None    Compliance with Home Oxygen Use  Yes      Initial 6 min Walk   Oxygen Used  Continuous    Liters per minute  3      Program Oxygen Prescription   Program Oxygen Prescription  Continuous    Liters per minute  3       Oxygen Re-Evaluation: Oxygen Re-Evaluation    Row Name 03/07/19 0704             Program Oxygen Prescription   Program Oxygen Prescription  Continuous       Liters per minute  3         Home Oxygen   Home Oxygen Device  Home Concentrator;E-Tanks       Sleep Oxygen Prescription  None       Home Exercise Oxygen Prescription  Continuous       Liters per minute  3       Home at Rest Exercise Oxygen Prescription  Continuous       Liters per minute  3       Compliance with Home Oxygen Use  Yes         Goals/Expected Outcomes   Short Term Goals  To learn and exhibit compliance with exercise, home and travel O2 prescription;To learn and understand importance of monitoring SPO2 with pulse oximeter and demonstrate accurate use  of the pulse oximeter.;To learn and understand importance of maintaining oxygen saturations>88%;To learn and demonstrate proper pursed lip breathing techniques or other breathing techniques.;To learn and demonstrate proper use of respiratory medications       Long  Term Goals  Compliance with respiratory medication;Exhibits proper breathing techniques, such as pursed lip breathing or other method taught during program session;Maintenance of O2 saturations>88%;Verbalizes importance of monitoring SPO2 with pulse oximeter and return  demonstration;Exhibits compliance with exercise, home and travel O2 prescription       Goals/Expected Outcomes  compliance          Oxygen Discharge (Final Oxygen Re-Evaluation): Oxygen Re-Evaluation - 03/07/19 0704      Program Oxygen Prescription   Program Oxygen Prescription  Continuous    Liters per minute  3      Home Oxygen   Home Oxygen Device  Home Concentrator;E-Tanks    Sleep Oxygen Prescription  None    Home Exercise Oxygen Prescription  Continuous    Liters per minute  3    Home at Rest Exercise Oxygen Prescription  Continuous    Liters per minute  3    Compliance with Home Oxygen Use  Yes      Goals/Expected Outcomes   Short Term Goals  To learn and exhibit compliance with exercise, home and travel O2 prescription;To learn and understand importance of monitoring SPO2 with pulse oximeter and demonstrate accurate use of the pulse oximeter.;To learn and understand importance of maintaining oxygen saturations>88%;To learn and demonstrate proper pursed lip breathing techniques or other breathing techniques.;To learn and demonstrate proper use of respiratory medications    Long  Term Goals  Compliance with respiratory medication;Exhibits proper breathing techniques, such as pursed lip breathing or other method taught during program session;Maintenance of O2 saturations>88%;Verbalizes importance of monitoring SPO2 with pulse oximeter and return  demonstration;Exhibits compliance with exercise, home and travel O2 prescription    Goals/Expected Outcomes  compliance       Initial Exercise Prescription: Initial Exercise Prescription - 02/02/19 1600      Date of Initial Exercise RX and Referring Provider   Date  02/02/19    Referring Provider  Dr. Lamonte Sakai      Oxygen   Oxygen  Continuous    Liters  3      Recumbant Bike   Level  1    Minutes  15      NuStep   Level  2    SPM  80    Minutes  15      Prescription Details   Frequency (times per week)  2    Duration  Progress to 30 minutes of continuous aerobic without signs/symptoms of physical distress      Intensity   THRR 40-80% of Max Heartrate  57-114    Ratings of Perceived Exertion  11-13    Perceived Dyspnea  0-4      Progression   Progression  Continue to progress workloads to maintain intensity without signs/symptoms of physical distress.      Resistance Training   Training Prescription  Yes    Weight  orange bands    Reps  10-15       Perform Capillary Blood Glucose checks as needed.  Exercise Prescription Changes: Exercise Prescription Changes    Row Name 02/07/19 1200 02/21/19 1100           Response to Exercise   Blood Pressure (Admit)  108/64  110/50      Blood Pressure (Exercise)  140/70  130/66      Blood Pressure (Exit)  126/70  104/60      Heart Rate (Admit)  81 bpm  89 bpm      Heart Rate (Exercise)  106 bpm  100 bpm      Heart Rate (Exit)  91 bpm  94 bpm      Oxygen Saturation (Admit)  98 %  95 %  Oxygen Saturation (Exercise)  94 %  96 %      Oxygen Saturation (Exit)  95 %  97 %      Rating of Perceived Exertion (Exercise)  12  11      Perceived Dyspnea (Exercise)  1  1      Duration  Progress to 30 minutes of  aerobic without signs/symptoms of physical distress  Continue with 30 min of aerobic exercise without signs/symptoms of physical distress.      Intensity  - 40-80% HRR  THRR unchanged        Progression   Progression   Continue to progress workloads to maintain intensity without signs/symptoms of physical distress.  Continue to progress workloads to maintain intensity without signs/symptoms of physical distress.        Resistance Training   Training Prescription  Yes  Yes      Weight  orange bands  orange bands      Reps  10-15  10-15      Time  10 Minutes  10 Minutes        Oxygen   Oxygen  Continuous  Continuous      Liters  3  3        NuStep   Level  2  2      SPM  80  80      Minutes  15  15      METs  2  2.1        Arm Ergometer   Level  1  1      Minutes  15  15         Exercise Comments:   Exercise Goals and Review:   Exercise Goals Re-Evaluation : Exercise Goals Re-Evaluation    Row Name 03/07/19 0706             Exercise Goal Re-Evaluation   Exercise Goals Review  Increase Physical Activity;Increase Strength and Stamina;Able to understand and use rate of perceived exertion (RPE) scale;Able to understand and use Dyspnea scale;Knowledge and understanding of Target Heart Rate Range (THRR);Understanding of Exercise Prescription       Comments  Pt has completed 7 exercise sessions. Pt had not exercised since high school upon joining the program and was deconditioned. Progress has been slow, but pt has been able to increase METs on the stepper from 1.8 to 2.3. Pt has a positive attitude and is accepting of workload increases. Will continue to monitor and progress as able.       Expected Outcomes  Through exercise at rehab and at home, the patient will decrease shortness of breath with daily activities and feel confident in carrying out an exercise regime at home.          Discharge Exercise Prescription (Final Exercise Prescription Changes): Exercise Prescription Changes - 02/21/19 1100      Response to Exercise   Blood Pressure (Admit)  110/50    Blood Pressure (Exercise)  130/66    Blood Pressure (Exit)  104/60    Heart Rate (Admit)  89 bpm    Heart Rate (Exercise)  100  bpm    Heart Rate (Exit)  94 bpm    Oxygen Saturation (Admit)  95 %    Oxygen Saturation (Exercise)  96 %    Oxygen Saturation (Exit)  97 %    Rating of Perceived Exertion (Exercise)  11    Perceived Dyspnea (Exercise)  1    Duration  Continue  with 30 min of aerobic exercise without signs/symptoms of physical distress.    Intensity  THRR unchanged      Progression   Progression  Continue to progress workloads to maintain intensity without signs/symptoms of physical distress.      Resistance Training   Training Prescription  Yes    Weight  orange bands    Reps  10-15    Time  10 Minutes      Oxygen   Oxygen  Continuous    Liters  3      NuStep   Level  2    SPM  80    Minutes  15    METs  2.1      Arm Ergometer   Level  1    Minutes  15       Nutrition:  Target Goals: Understanding of nutrition guidelines, daily intake of sodium <1541m, cholesterol <2083m calories 30% from fat and 7% or less from saturated fats, daily to have 5 or more servings of fruits and vegetables.  Biometrics: Pre Biometrics - 02/02/19 1558      Pre Biometrics   Grip Strength  33 kg        Nutrition Therapy Plan and Nutrition Goals:   Nutrition Assessments:   Nutrition Goals Re-Evaluation:   Nutrition Goals Discharge (Final Nutrition Goals Re-Evaluation):   Psychosocial: Target Goals: Acknowledge presence or absence of significant depression and/or stress, maximize coping skills, provide positive support system. Participant is able to verbalize types and ability to use techniques and skills needed for reducing stress and depression.  Initial Review & Psychosocial Screening: Initial Psych Review & Screening - 03/06/19 1020      Initial Review   Current issues with  None Identified      Family Dynamics   Good Support System?  Yes      Barriers   Psychosocial barriers to participate in program  There are no identifiable barriers or psychosocial needs.      Screening  Interventions   Interventions  Encouraged to exercise    Expected Outcomes  Long Term goal: The participant improves quality of Life and PHQ9 Scores as seen by post scores and/or verbalization of changes;Long Term Goal: Stressors or current issues are controlled or eliminated.       Quality of Life Scores:  Scores of 19 and below usually indicate a poorer quality of life in these areas.  A difference of  2-3 points is a clinically meaningful difference.  A difference of 2-3 points in the total score of the Quality of Life Index has been associated with significant improvement in overall quality of life, self-image, physical symptoms, and general health in studies assessing change in quality of life.  PHQ-9: Recent Review Flowsheet Data    [facility] 02/02/2019   Decreased Interest 0 0   Down, Depressed, Hopeless 0 0   PHQ - 2 Score 0 0   Altered sleeping 0 -   Tired, decreased energy 0 -   Change in appetite 0 -   Feeling bad or failure about yourself  0 -   Trouble concentrating 0 -   Moving slowly or fidgety/restless 0 -   Suicidal thoughts 0 -   PHQ-9 Score 0 -   Difficult doing work/chores Not difficult at all -     Interpretation of Total Score  Total Score Depression Severity:  1-4 = Minimal depression, 5-9 = Mild depression, 10-14 = Moderate depression, 15-19 =  Moderately severe depression, 20-27 = Severe depression   Psychosocial Evaluation and Intervention: Psychosocial Evaluation - 02/02/19 1334      Psychosocial Evaluation & Interventions   Interventions  --   no interventions required      Psychosocial Re-Evaluation: Psychosocial Re-Evaluation    [place] Name 02/02/19 1335 03/06/19 1021           Psychosocial Re-Evaluation   Current issues with  None Identified  None Identified      Comments  -  no barriers to participation in pulmonary rehab      Expected Outcomes  -  Continues to have no barriers to participation in pulmonary rehab       Interventions  -  Encouraged to attend Pulmonary Rehabilitation for the exercise      Continue Psychosocial Services   No Follow up required  No Follow up required         Psychosocial Discharge (Final Psychosocial Re-Evaluation): Psychosocial Re-Evaluation - 03/06/19 1021      Psychosocial Re-Evaluation   Current issues with  None Identified    Comments  no barriers to participation in pulmonary rehab    Expected Outcomes  Continues to have no barriers to participation in pulmonary rehab    Interventions  Encouraged to attend Pulmonary Rehabilitation for the exercise    Continue Psychosocial Services   No Follow up required       Education: Education Goals: Education classes will be provided on a weekly basis, covering required topics. Participant will state understanding/return demonstration of topics presented.  Learning Barriers/Preferences: Learning Barriers/Preferences - 02/02/19 1335      Learning Barriers/Preferences   Learning Barriers  None    Learning Preferences  Audio;Computer/Internet;Group Instruction;Individual Instruction;Pictoral;Skilled Demonstration;Verbal Instruction;Video;Written Material       Education Topics: Risk Factor Reduction:  -Group instruction that is supported by a PowerPoint presentation. Instructor discusses the definition of a risk factor, different risk factors for pulmonary disease, and how the heart and lungs work together.     Nutrition for Pulmonary Patient:  -Group instruction provided by PowerPoint slides, verbal discussion, and written materials to support subject matter. The instructor gives an explanation and review of healthy diet recommendations, which includes a discussion on weight management, recommendations for fruit and vegetable consumption, as well as protein, fluid, caffeine, fiber, sodium, sugar, and alcohol. Tips for eating when patients are short of breath are discussed.   Pursed Lip Breathing:  -Group instruction  that is supported by demonstration and informational handouts. Instructor discusses the benefits of pursed lip and diaphragmatic breathing and detailed demonstration on how to preform both.     Oxygen Safety:  -Group instruction provided by PowerPoint, verbal discussion, and written material to support subject matter. There is an overview of "What is Oxygen" and "Why do we need it".  Instructor also reviews how to create a safe environment for oxygen use, the importance of using oxygen as prescribed, and the risks of noncompliance. There is a brief discussion on traveling with oxygen and resources the patient may utilize.   Oxygen Equipment:  -Group instruction provided by [facility] Staff utilizing handouts, written materials, and equipment demonstrations.   Signs and Symptoms:  -Group instruction provided by written material and verbal discussion to support subject matter. Warning signs and symptoms of infection, stroke, and heart attack are reviewed and when to call the physician/911 reinforced. Tips for preventing the spread of infection discussed.   Advanced Directives:  -Group instruction provided by verbal instruction and written  material to support subject matter. Instructor reviews Advanced Directive laws and proper instruction for filling out document.   Pulmonary Video:  -Group video education that reviews the importance of medication and oxygen compliance, exercise, good nutrition, pulmonary hygiene, and pursed lip and diaphragmatic breathing for the pulmonary patient.   Exercise for the Pulmonary Patient:  -Group instruction that is supported by a PowerPoint presentation. Instructor discusses benefits of exercise, core components of exercise, frequency, duration, and intensity of an exercise routine, importance of utilizing pulse oximetry during exercise, safety while exercising, and options of places to exercise outside of rehab.     Pulmonary Medications:  -Verbally  interactive group education provided by instructor with focus on inhaled medications and proper administration.   Anatomy and Physiology of the Respiratory System and Intimacy:  -Group instruction provided by PowerPoint, verbal discussion, and written material to support subject matter. Instructor reviews respiratory cycle and anatomical components of the respiratory system and their functions. Instructor also reviews differences in obstructive and restrictive respiratory diseases with examples of each. Intimacy, Sex, and Sexuality differences are reviewed with a discussion on how relationships can change when diagnosed with pulmonary disease. Common sexual concerns are reviewed.   MD DAY -A group question and answer session with a medical doctor that allows participants to ask questions that relate to their pulmonary disease state.   OTHER EDUCATION -Group or individual verbal, written, or video instructions that support the educational goals of the pulmonary rehab program.   Holiday Eating Survival Tips:  -Group instruction provided by PowerPoint slides, verbal discussion, and written materials to support subject matter. The instructor gives patients tips, tricks, and techniques to help them not only survive but enjoy the holidays despite the onslaught of food that accompanies the holidays.   Knowledge Questionnaire Score: Knowledge Questionnaire Score - 02/02/19 1604      Knowledge Questionnaire Score   Pre Score  15/18       Core Components/Risk Factors/Patient Goals at Admission: Personal Goals and Risk Factors at Admission - 02/02/19 1336      Core Components/Risk Factors/Patient Goals on Admission    Weight Management  Weight Loss    Improve shortness of breath with ADL's  Yes    Intervention  Provide education, individualized exercise plan and daily activity instruction to help decrease symptoms of SOB with activities of daily living.    Expected Outcomes  Short Term:  Improve cardiorespiratory fitness to achieve a reduction of symptoms when performing ADLs;Long Term: Be able to perform more ADLs without symptoms or delay the onset of symptoms       Core Components/Risk Factors/Patient Goals Review:  Goals and Risk Factor Review    Row Name 02/02/19 1604 03/06/19 1023           Core Components/Risk Factors/Patient Goals Review   Personal Goals Review  Weight Management/Obesity;Increase knowledge of respiratory medications and ability to use respiratory devices properly.;Improve shortness of breath with ADL's;Develop more efficient breathing techniques such as purse lipped breathing and diaphragmatic breathing and practicing self-pacing with activity.  Weight Management/Obesity;Improve shortness of breath with ADL's;Develop more efficient breathing techniques such as purse lipped breathing and diaphragmatic breathing and practicing self-pacing with activity.;Increase knowledge of respiratory medications and ability to use respiratory devices properly.      Review  -  Maintaing weight, extremely short of breath with any activity.  She has never exercised prior to coming to pulmonary rehab.  She is very slow to progress d/t her emphysema and deconditioning.  She is on level 3 of the nustep and level 1.5 of the arm ergomenter      Expected Outcomes  -  See admission goals.         Core Components/Risk Factors/Patient Goals at Discharge (Final Review):  Goals and Risk Factor Review - 03/06/19 1023      Core Components/Risk Factors/Patient Goals Review   Personal Goals Review  Weight Management/Obesity;Improve shortness of breath with ADL's;Develop more efficient breathing techniques such as purse lipped breathing and diaphragmatic breathing and practicing self-pacing with activity.;Increase knowledge of respiratory medications and ability to use respiratory devices properly.    Review  Maintaing weight, extremely short of breath with any activity.  She has never  exercised prior to coming to pulmonary rehab.  She is very slow to progress d/t her emphysema and deconditioning.  She is on level 3 of the nustep and level 1.5 of the arm ergomenter    Expected Outcomes  See admission goals.       ITP Comments:   Comments: ITP REVIEW Pt is making expected progress toward pulmonary rehab goals after completing 7 sessions. Recommend continued exercise, life style modification, education, and utilization of breathing techniques to increase stamina and strength and decrease shortness of breath with exertion.

## 2019-03-07 NOTE — Progress Notes (Signed)
Daily Session Note  Patient Details  Name: Ariel Gonzalez MRN: [id number] Date of Birth: 1941/02/16 Referring Provider:     Pulmonary Rehab Walk Test from 02/02/2019 in [place]  Referring Provider  Dr. Lamonte Sakai      Encounter Date: 03/07/2019  Check In: Session Check In - 03/07/19 1146      Check-In   Supervising physician immediately available to respond to emergencies  [facility] immediately available    Physician(s)  Dr. Benny Lennert    Location  [facility]    Staff Present  Rosebud Poles, RN, Bjorn Loser, MS, Exercise Physiologist;Lisa Ysidro Evert, RN    Virtual Visit  No    Medication changes reported      No    Fall or balance concerns reported     No    Tobacco Cessation  No Change    Warm-up and Cool-down  Performed as group-led instruction    Resistance Training Performed  Yes    VAD Patient?  No    PAD/SET Patient?  No      Pain Assessment   Currently in Pain?  No/denies    Multiple Pain Sites  No       Capillary Blood Glucose: No results found for this or any previous visit (from the past 24 hour(s)).  Exercise Prescription Changes - 03/07/19 1200      Response to Exercise   Blood Pressure (Admit)  100/60    Blood Pressure (Exercise)  140/66    Blood Pressure (Exit)  122/74    Heart Rate (Admit)  92 bpm    Heart Rate (Exercise)  104 bpm    Heart Rate (Exit)  96 bpm    Oxygen Saturation (Admit)  96 %    Oxygen Saturation (Exercise)  95 %    Oxygen Saturation (Exit)  96 %    Rating of Perceived Exertion (Exercise)  12    Perceived Dyspnea (Exercise)  2    Duration  Continue with 30 min of aerobic exercise without signs/symptoms of physical distress.    Intensity  THRR unchanged      Progression   Progression  Continue to progress workloads to maintain intensity without signs/symptoms of physical distress.      Resistance Training   Training Prescription  Yes    Weight  orange bands    Reps  10-15     Time  10 Minutes      Oxygen   Oxygen  Continuous    Liters  3      NuStep   Level  3    SPM  80    Minutes  15    METs  2.2      Arm Ergometer   Level  1.5    Minutes  15       Social History   Tobacco Use  Smoking Status Former Smoker  . Packs/day: 1.00  . Years: 50.00  . Pack years: 50.00  . Types: Cigarettes  . Quit date: 04/01/2008  . Years since quitting: 10.9  Smokeless Tobacco Never Used  Tobacco Comment   Counseled to remain smoke free    Goals Met:  Proper associated with RPD/PD & O2 Sat Exercise tolerated well Strength training completed today  Goals Unmet:  Not Applicable  Comments: Service time is from 1038 to 1145   Dr. Rush Farmer is Medical Director for Pulmonary Rehab at [facility].

## 2019-03-08 ENCOUNTER — Encounter: Payer: Self-pay | Admitting: Emergency Medicine

## 2019-03-08 ENCOUNTER — Ambulatory Visit (INDEPENDENT_AMBULATORY_CARE_PROVIDER_SITE_OTHER): Payer: Medicare Other | Admitting: Emergency Medicine

## 2019-03-08 DIAGNOSIS — J449 Chronic obstructive pulmonary disease, unspecified: Secondary | ICD-10-CM

## 2019-03-08 DIAGNOSIS — J9611 Chronic respiratory failure with hypoxia: Secondary | ICD-10-CM | POA: Diagnosis not present

## 2019-03-08 DIAGNOSIS — Z23 Encounter for immunization: Secondary | ICD-10-CM | POA: Diagnosis not present

## 2019-03-08 DIAGNOSIS — I2510 Atherosclerotic heart disease of native coronary artery without angina pectoris: Secondary | ICD-10-CM | POA: Diagnosis not present

## 2019-03-08 DIAGNOSIS — J301 Allergic rhinitis due to pollen: Secondary | ICD-10-CM

## 2019-03-08 DIAGNOSIS — I2584 Coronary atherosclerosis due to calcified coronary lesion: Secondary | ICD-10-CM | POA: Diagnosis not present

## 2019-03-08 MED ORDER — STIOLTO RESPIMAT 2.5-2.5 MCG/ACT IN AERS: 2.0000 | INHALATION_SPRAY | Freq: Every day | RESPIRATORY_TRACT | 0 refills | Status: DC

## 2019-03-08 NOTE — Assessment & Plan Note (Signed)
She is doing well with pulmonary rehab, feels that she is benefiting.  She uses oxygen 3 L/min with exertion under their supervision.  She has had wheezing with exertion that seems to have responded better to nebulized albuterol, question whether she is having difficulty delivering some of her maintenance medications. She does not exacerbate frequently, does not have a significant mucus burden.  I think will be reasonable to try changing her Symbicort and Spiriva over to Stiolto (without the ICS) to see if she gets more benefit.  If we start to deal with increased exacerbation frequency, increased cough, increased sputum then I would like to add the ICS back.  She needs a flu shot today.  Pneumonia vaccine up-to-date

## 2019-03-08 NOTE — Progress Notes (Signed)
History of Present Illness:   ROV 05/25/17 --follow-up visit for 78 year old woman with severe COPD. She has been managed with Spiriva and Symbicort; changed from [place] in March. She had an acute exacerbation in April that required pred and abx. She has oxygen that she uses prn w exertion on 3L/min. She is able to walk some and does her housework without it. She is using albuterol about 1-2x a month. She averages about 1 AE a year. She coughs about every morning, clear mucous. Occasional wheeze.   ROV 09/02/2018 --Ariel Gonzalez is 51.  She follows up today for history of chronic hypoxemic respiratory failure in the setting of severe COPD.  She was last seen here in May 2019.  We have been managing her on Spiriva and Symbicort.  Her Symbicort was recently changed to the generic formulation. She is typically using her O2 at all times - 3L/min. Her activity is fairly curtailed, is still able to do some housework. She she is able to shop, but does get worn out carrying them. Minimal wheeze. She does have am cough, clear to white mucous. No flares since last time. She has had her flu shot. PNA shot is up to date.   ROV 03/08/2019 --this a follow-up visit for 78 year old woman with a history of chronic hypoxemic respiratory failure due to her severe COPD.  She has been managed on Symbicort, Spiriva. She reports that she is doing Pulmonary Rehab, feels that she is benefiting significantly. She wears 3L/min w her exercise. She reports that they heard wheeze at rehab, recommended that we start albuterol nebs - which we did. She has benefited significantly. Uses it about 1-2x a day. Minimal cough. She does have wheeze. Her last flare was years ago. She does not recall that she is taking singulair.    Vitals:   03/08/19 1357  BP: 130/72  Pulse: 72  Weight: 143 lb (64.9 kg)  Height: 5\' 6"  (1.676 m)   Gen: Pleasant, elderly  in no distress,  normal affect  ENT: No lesions,  mouth clear,  oropharynx clear, no  drainage  Neck: No JVD, no stridor  Lungs: No use of accessory muscles, distant breath sounds, prolonged expiratory phase, no wheeze on normal breath, she does wheeze on a forced exp  Cardiovascular: RRR, heart sounds normal, no murmur or gallops, no peripheral edema  Musculoskeletal: No deformities, no cyanosis or clubbing  Neuro: alert, non focal  Skin: Warm, no lesions or rashes   COPD (chronic obstructive pulmonary disease) She is doing well with pulmonary rehab, feels that she is benefiting.  She uses oxygen 3 L/min with exertion under their supervision.  She has had wheezing with exertion that seems to have responded better to nebulized albuterol, question whether she is having difficulty delivering some of her maintenance medications. She does not exacerbate frequently, does not have a significant mucus burden.  I think will be reasonable to try changing her Symbicort and Spiriva over to Stiolto (without the ICS) to see if she gets more benefit.  If we start to deal with increased exacerbation frequency, increased cough, increased sputum then I would like to add the ICS back.  She needs a flu shot today.  Pneumonia vaccine up-to-date  Allergic rhinitis Unclear that she is still taking singulair - seems to be tolerating off of it.   Chronic respiratory failure with hypoxia (HCC) Requalified for oxygen today.  Continue at 3 to 3.5 L/min    Baltazar Apo, MD, PhD 03/08/2019, 2:23 PM  [facility] [phone] or if no answer [phone]

## 2019-03-08 NOTE — Assessment & Plan Note (Signed)
Requalified for oxygen today.  Continue at 3 to 3.5 L/min

## 2019-03-08 NOTE — Patient Instructions (Signed)
Please stop Symbicort and Spiriva for now. We will try starting Stiolto 2 puffs once daily to see if you get more benefit.  Please call when the samples run out to let us know if you have benefited.  If so then we will call this medication to your pharmacy. Keep your albuterol available to use either 1 nebulizer treatment or 2 puffs up to every 4 hours if needed for shortness of breath, chest tightness, wheezing. Continue your oxygen at up to 3.5 L/min when you are exerting herself. Continue pulmonary rehabilitation Your pneumonia shots are up-to-date Flu shot today. Follow with Dr Lamonte Sakai in 6 months or sooner if you have any problems

## 2019-03-08 NOTE — Assessment & Plan Note (Signed)
Unclear that she is still taking singulair - seems to be tolerating off of it.

## 2019-03-09 ENCOUNTER — Other Ambulatory Visit: Payer: Self-pay

## 2019-03-09 ENCOUNTER — Encounter (HOSPITAL_COMMUNITY)
Admission: RE | Admit: 2019-03-09 | Discharge: 2019-03-09 | Disposition: A | Discharge status: Home / Self Care | Payer: Medicare Other | Source: Ambulatory Visit | Attending: Emergency Medicine | Admitting: Emergency Medicine

## 2019-03-09 DIAGNOSIS — J438 Other emphysema: Secondary | ICD-10-CM

## 2019-03-09 NOTE — Progress Notes (Signed)
Daily Session Note  Patient Details  Name: Ariel Gonzalez MRN: [id number] Date of Birth: 1941-06-11 Referring Provider:     Pulmonary Rehab Walk Test from 02/02/2019 in [place]  Referring Provider  Dr. Lamonte Sakai      Encounter Date: 03/09/2019  Check In: Session Check In - 03/09/19 1150      Check-In   Supervising physician immediately available to respond to emergencies  [facility] immediately available    Physician(s)  Dr. Benny Lennert    Location  [facility]    Staff Present  Rosebud Poles, RN, Bjorn Loser, MS, Exercise Physiologist;Lisa Ysidro Evert, RN    Virtual Visit  No    Medication changes reported      No    Fall or balance concerns reported     No    Tobacco Cessation  No Change    Warm-up and Cool-down  Performed as group-led instruction    Resistance Training Performed  Yes    VAD Patient?  No    PAD/SET Patient?  No      Pain Assessment   Currently in Pain?  No/denies    Multiple Pain Sites  No       Capillary Blood Glucose: No results found for this or any previous visit (from the past 24 hour(s)).    Social History   Tobacco Use  Smoking Status Former Smoker  . Packs/day: 1.00  . Years: 50.00  . Pack years: 50.00  . Types: Cigarettes  . Quit date: 04/01/2008  . Years since quitting: 10.9  Smokeless Tobacco Never Used  Tobacco Comment   Counseled to remain smoke free    Goals Met:  Proper associated with RPD/PD & O2 Sat Exercise tolerated well Strength training completed today  Goals Unmet:  Not Applicable  Comments: Service time is from 1035 to 1130    Dr. Rush Farmer is Medical Director for Pulmonary Rehab at [facility].

## 2019-03-13 ENCOUNTER — Telehealth: Payer: Self-pay | Admitting: Emergency Medicine

## 2019-03-14 ENCOUNTER — Encounter (HOSPITAL_COMMUNITY)
Admission: RE | Admit: 2019-03-14 | Discharge: 2019-03-14 | Disposition: A | Discharge status: Home / Self Care | Payer: Medicare Other | Source: Ambulatory Visit | Attending: Emergency Medicine | Admitting: Emergency Medicine

## 2019-03-14 ENCOUNTER — Other Ambulatory Visit: Payer: Self-pay

## 2019-03-14 DIAGNOSIS — J438 Other emphysema: Secondary | ICD-10-CM | POA: Diagnosis not present

## 2019-03-14 MED ORDER — ALBUTEROL SULFATE (2.5 MG/3ML) 0.083% IN NEBU: 2.5000 mg | INHALATION_SOLUTION | Freq: Four times a day (QID) | RESPIRATORY_TRACT | 3 refills | Status: DC | PRN

## 2019-03-14 NOTE — Progress Notes (Signed)
Daily Session Note  Patient Details  Name: Ariel Gonzalez MRN: [id number] Date of Birth: April 15, 1941 Referring Provider:     Pulmonary Rehab Walk Test from 02/02/2019 in [place]  Referring Provider  Dr. Lamonte Sakai      Encounter Date: 03/14/2019  Check In: Session Check In - 03/14/19 1112      Check-In   Supervising physician immediately available to respond to emergencies  [facility] immediately available    Physician(s)  Dr. Benny Lennert    Location  [facility]    Staff Present  Rosebud Poles, RN, BSN;Carlette Wilber [place], RN, Bjorn Loser, MS, Exercise Physiologist    Virtual Visit  No    Medication changes reported      No    Fall or balance concerns reported     No    Tobacco Cessation  No Change    Warm-up and Cool-down  Performed as group-led instruction    Resistance Training Performed  Yes    VAD Patient?  No    PAD/SET Patient?  No      Pain Assessment   Currently in Pain?  No/denies    Multiple Pain Sites  No       Capillary Blood Glucose: No results found for this or any previous visit (from the past 24 hour(s)).    Social History   Tobacco Use  Smoking Status Former Smoker  . Packs/day: 1.00  . Years: 50.00  . Pack years: 50.00  . Types: Cigarettes  . Quit date: 04/01/2008  . Years since quitting: 10.9  Smokeless Tobacco Never Used  Tobacco Comment   Counseled to remain smoke free    Goals Met:  Proper associated with RPD/PD & O2 Sat Exercise tolerated well Strength training completed today  Goals Unmet:  Not Applicable  Comments: Service time is from 1045 to 1145    Dr. Rush Farmer is Medical Director for Pulmonary Rehab at [facility].

## 2019-03-14 NOTE — Telephone Encounter (Signed)
Spoke with pt. She is requesting a 90 day supply of albuterol neb solution to be sent to her pharmacy. This has been taken care of. Nothing further was needed.

## 2019-03-15 ENCOUNTER — Telehealth: Payer: Self-pay | Admitting: Emergency Medicine

## 2019-03-15 NOTE — Telephone Encounter (Signed)
ATC pt, there was no answer and I could not leave a message. Will try back. 

## 2019-03-16 ENCOUNTER — Encounter (HOSPITAL_COMMUNITY)
Admission: RE | Admit: 2019-03-16 | Discharge: 2019-03-16 | Disposition: A | Discharge status: Home / Self Care | Payer: Medicare Other | Source: Ambulatory Visit | Attending: Emergency Medicine | Admitting: Emergency Medicine

## 2019-03-16 ENCOUNTER — Other Ambulatory Visit: Payer: Self-pay

## 2019-03-16 DIAGNOSIS — J438 Other emphysema: Secondary | ICD-10-CM | POA: Diagnosis not present

## 2019-03-16 NOTE — Telephone Encounter (Signed)
Contacted [employer] and spoke to [place].  She states patient needs '6 minute walk' in order to requalify for oxygen.    Will route to Dr. Lamonte Sakai for review and advice on orders.  LOV: 03/08/19 COPD/ Dr. Lamonte Sakai She is doing well with pulmonary rehab, feels that she is benefiting.  She uses oxygen 3 L/min with exertion under their supervision.  She has had wheezing with exertion that seems to have responded better to nebulized albuterol, question whether she is having difficulty delivering some of her maintenance medications. She does not exacerbate frequently, does not have a significant mucus burden.  I think will be reasonable to try changing her Symbicort and Spiriva over to Stiolto (without the ICS) to see if she gets more benefit.  If we start to deal with increased exacerbation frequency, increased cough, increased sputum then I would like to add the ICS back.  She needs a flu shot today.  Pneumonia vaccine up-to-date

## 2019-03-16 NOTE — Progress Notes (Signed)
Daily Session Note  Patient Details  Name: Ariel Gonzalez MRN: [id number] Date of Birth: Feb 04, 1941 Referring Provider:     Pulmonary Rehab Walk Test from 02/02/2019 in [place]  Referring Provider  Dr. Lamonte Sakai      Encounter Date: 03/16/2019  Check In: Session Check In - 03/16/19 1113      Check-In   Supervising physician immediately available to respond to emergencies  [facility] immediately available    Physician(s)  Dr. Benny Lennert    Location  [facility]    Staff Present  Rosebud Poles, RN, Bjorn Loser, MS, Exercise Physiologist;Lisa Ysidro Evert, RN    Virtual Visit  No    Medication changes reported      No    Fall or balance concerns reported     No    Tobacco Cessation  No Change    Warm-up and Cool-down  Performed on first and last piece of equipment    Resistance Training Performed  Yes    VAD Patient?  No    PAD/SET Patient?  No      Pain Assessment   Currently in Pain?  No/denies    Multiple Pain Sites  No       Capillary Blood Glucose: No results found for this or any previous visit (from the past 24 hour(s)).    Social History   Tobacco Use  Smoking Status Former Smoker  . Packs/day: 1.00  . Years: 50.00  . Pack years: 50.00  . Types: Cigarettes  . Quit date: 04/01/2008  . Years since quitting: 10.9  Smokeless Tobacco Never Used  Tobacco Comment   Counseled to remain smoke free    Goals Met:  Proper associated with RPD/PD & O2 Sat Exercise tolerated well Strength training completed today  Goals Unmet:  Not Applicable  Comments: Service time is from 1030 to [place]    Dr. Rush Farmer is Medical Director for Pulmonary Rehab at [facility].

## 2019-03-21 ENCOUNTER — Other Ambulatory Visit: Payer: Self-pay

## 2019-03-21 ENCOUNTER — Encounter (HOSPITAL_COMMUNITY)
Admission: RE | Admit: 2019-03-21 | Discharge: 2019-03-21 | Disposition: A | Discharge status: Home / Self Care | Payer: Medicare Other | Source: Ambulatory Visit | Attending: Emergency Medicine | Admitting: Emergency Medicine

## 2019-03-21 ENCOUNTER — Emergency Department (HOSPITAL_COMMUNITY): Payer: Medicare Other

## 2019-03-21 ENCOUNTER — Emergency Department (HOSPITAL_COMMUNITY)
Admission: EM | Admit: 2019-03-21 | Discharge: 2019-03-21 | Disposition: A | Discharge status: Home / Self Care | Payer: Medicare Other | Attending: Emergency Medicine | Admitting: Emergency Medicine

## 2019-03-21 DIAGNOSIS — J449 Chronic obstructive pulmonary disease, unspecified: Secondary | ICD-10-CM | POA: Diagnosis not present

## 2019-03-21 DIAGNOSIS — E559 Vitamin D deficiency, unspecified: Secondary | ICD-10-CM | POA: Diagnosis not present

## 2019-03-21 DIAGNOSIS — J438 Other emphysema: Secondary | ICD-10-CM

## 2019-03-21 DIAGNOSIS — Z85828 Personal history of other malignant neoplasm of skin: Secondary | ICD-10-CM | POA: Diagnosis not present

## 2019-03-21 DIAGNOSIS — F410 Panic disorder [episodic paroxysmal anxiety] without agoraphobia: Secondary | ICD-10-CM | POA: Diagnosis not present

## 2019-03-21 DIAGNOSIS — R000 Tachycardia, unspecified: Secondary | ICD-10-CM | POA: Diagnosis not present

## 2019-03-21 DIAGNOSIS — I2510 Atherosclerotic heart disease of native coronary artery without angina pectoris: Secondary | ICD-10-CM | POA: Diagnosis not present

## 2019-03-21 DIAGNOSIS — E785 Hyperlipidemia, unspecified: Secondary | ICD-10-CM | POA: Diagnosis not present

## 2019-03-21 DIAGNOSIS — M8580 Other specified disorders of bone density and structure, unspecified site: Secondary | ICD-10-CM | POA: Diagnosis not present

## 2019-03-21 DIAGNOSIS — Z87891 Personal history of nicotine dependence: Secondary | ICD-10-CM | POA: Diagnosis not present

## 2019-03-21 DIAGNOSIS — R0902 Hypoxemia: Secondary | ICD-10-CM | POA: Diagnosis not present

## 2019-03-21 DIAGNOSIS — F322 Major depressive disorder, single episode, severe without psychotic features: Secondary | ICD-10-CM | POA: Diagnosis not present

## 2019-03-21 DIAGNOSIS — I4891 Unspecified atrial fibrillation: Secondary | ICD-10-CM | POA: Diagnosis not present

## 2019-03-21 DIAGNOSIS — Z79899 Other long term (current) drug therapy: Secondary | ICD-10-CM | POA: Insufficient documentation

## 2019-03-21 DIAGNOSIS — I10 Essential (primary) hypertension: Secondary | ICD-10-CM | POA: Insufficient documentation

## 2019-03-21 DIAGNOSIS — F3342 Major depressive disorder, recurrent, in full remission: Secondary | ICD-10-CM | POA: Diagnosis not present

## 2019-03-21 LAB — CBC
HCT: 43.8 % (ref 36.0–46.0)
Hemoglobin: 14.4 g/dL (ref 12.0–15.0)
MCH: 31.7 pg (ref 26.0–34.0)
MCHC: 32.9 g/dL (ref 30.0–36.0)
MCV: 96.5 fL (ref 80.0–100.0)
Platelets: 269 10*3/uL (ref 150–400)
RBC: 4.54 MIL/uL (ref 3.87–5.11)
RDW: 12.9 % (ref 11.5–15.5)
WBC: 7.8 10*3/uL (ref 4.0–10.5)
nRBC: 0 % (ref 0.0–0.2)

## 2019-03-21 LAB — RAPID URINE DRUG SCREEN, HOSP PERFORMED
Amphetamines: NOT DETECTED
Barbiturates: NOT DETECTED
Benzodiazepines: NOT DETECTED
Cocaine: NOT DETECTED
Opiates: NOT DETECTED
Tetrahydrocannabinol: NOT DETECTED

## 2019-03-21 LAB — BASIC METABOLIC PANEL
Anion gap: 10 (ref 5–15)
BUN: 17 mg/dL (ref 8–23)
CO2: 25 mmol/L (ref 22–32)
Calcium: 9.1 mg/dL (ref 8.9–10.3)
Chloride: 104 mmol/L (ref 98–111)
Creatinine, Ser: 0.88 mg/dL (ref 0.44–1.00)
GFR calc Af Amer: >60 mL/min (ref >60)
GFR calc non Af Amer: >60 mL/min (ref >60)
Glucose, Bld: 145 mg/dL — ABNORMAL HIGH (ref 70–99)
Potassium: 3.7 mmol/L (ref 3.5–5.1)
Sodium: 139 mmol/L (ref 135–145)

## 2019-03-21 MED ORDER — APIXABAN 5 MG PO TABS: 5.0000 mg | ORAL_TABLET | Freq: Two times a day (BID) | ORAL | 0 refills | Status: DC

## 2019-03-21 MED ORDER — SODIUM CHLORIDE 0.9 % IV BOLUS (SEPSIS)
1000.0000 mL | Freq: Once | INTRAVENOUS | Status: AC
  Administered 2019-03-21: 1000 mL via INTRAVENOUS

## 2019-03-21 MED ORDER — METOPROLOL SUCCINATE ER 25 MG PO TB24: 25.0000 mg | ORAL_TABLET | Freq: Two times a day (BID) | ORAL | 0 refills | Status: DC

## 2019-03-21 MED ORDER — METOPROLOL TARTRATE 25 MG PO TABS
25.0000 mg | ORAL_TABLET | Freq: Once | ORAL | Status: AC
  Administered 2019-03-21: 25 mg via ORAL
  Filled 2019-03-21: qty 1

## 2019-03-21 MED ORDER — DILTIAZEM HCL 25 MG/5ML IV SOLN
20.0000 mg | Freq: Once | INTRAVENOUS | Status: DC
  Filled 2019-03-21: qty 5

## 2019-03-21 MED ORDER — SODIUM CHLORIDE 0.9% FLUSH: 3.0000 mL | Freq: Once | INTRAVENOUS | Status: DC

## 2019-03-21 MED ORDER — APIXABAN 5 MG PO TABS
5.0000 mg | ORAL_TABLET | ORAL | Status: AC
  Administered 2019-03-21: 5 mg via ORAL
  Filled 2019-03-21: qty 1

## 2019-03-21 NOTE — Discharge Instructions (Signed)
Thank you for allowing me to care for you today. Please return to the emergency department if you have new or worsening symptoms. Take your medications as instructed.  ° °

## 2019-03-21 NOTE — Telephone Encounter (Signed)
[employer] callibg wanting to know if pt was tested on room air please advise they can be reached @ [phone].Ariel Gonzalez

## 2019-03-21 NOTE — ED Triage Notes (Signed)
Came in via ems; reported pt was at pulmonary rehab and staff noticed increased HR in 140s; pt was sent to clinic and fou nd HR sustaining 140-160s; sent to ED for further evaluation; pt denies symptoms upon arrival to ED. NAD.

## 2019-03-21 NOTE — Progress Notes (Signed)
Incomplete Session Note  Patient Details  Name: SUESAN FOLZ MRN: [id number] Date of Birth: 07/28/1940 Referring Provider:     Pulmonary Rehab Walk Test from 02/02/2019 in [place]  Referring Provider  Dr. Gevena Cotton did not complete her rehab session.  Laiken arrived at pulmonary rehab today with a heart rate of 142-145 consistently at rest.  She was placed on a monitor and was found to be in sinus tach.  BP 106/60, Spo2 93%, HR 145, in no acute distress and was unaware her heart rate was this elevated.  This is a new finding for Brittie. I called her PCP, Dr. Marolyn Hammock with [facility] and she has an appointment with him this afternoon @ 2:45pm.  She was not allowed to exercise today and was sent home and to see her PCP this afternoon.

## 2019-03-21 NOTE — Telephone Encounter (Signed)
Spoke with the billing dept at [place]. They wanted to know if the patient was walked on room air during her OV on 03/08/2019 and 09/14/2016. Advised them I could not tell if the patient was actually walked during the 8/26, just that the O2 template was updated. Did advise them she was walked on room air during 09/14/2016.   They will call back if more information is needed.

## 2019-03-21 NOTE — ED Provider Notes (Signed)
[facility] EMERGENCY DEPARTMENT Provider Note   CSN: [id number] Arrival date & time: 03/21/19  1600     History   Chief Complaint Chief Complaint  Patient presents with  . Tachycardia    HPI LOLITA LECKER is a 78 y.o. female.     Patient is a 78 year old female with past medical history of COPD, on home oxygen, hypertension, osteopenia, panic attacks who presents the emergency department for tachycardia.  Patient presented to her usual pulmonary rehab visit where her vitals were taken and she was found to be in sinus tachycardia with rates in the 140s and 160s.  Patient was told to see her primary care doctor that day.  She saw her primary care doctor who then sent her to the emergency department.  Patient reports that she has had no history of the same in the past.  Reports that she feels fine and her usual self.  Denies any chest pain, shortness of breath, nausea, vomiting, palpitations.  Denies feeling anxious or increased activity recently.     Past Medical History:  Diagnosis Date  . Allergic rhinitis   . Arthritis    CERVICAL AND LUMBER DDD  . COPD (chronic obstructive pulmonary disease) ([place])    HFA 75% p coaching July 17, 2010  . HTN (hypertension)   . Migraine   . Osteopenia   . Overactive bladder   . Panic attacks   . Squamous cell carcinoma of skin     Patient Active Problem List   Diagnosis Date Noted  . Coronary artery calcification 10/02/2015  . Hyperlipidemia 10/02/2015  . Cough 06/13/2015  . Allergic rhinitis 10/05/2012  . UPPER RESPIRATORY INFECTION 08/28/2010  . Chronic respiratory failure with hypoxia ([place]) 07/17/2010  . COPD (chronic obstructive pulmonary disease) ([place]) 04/06/2007    Past Surgical History:  Procedure Laterality Date  . [place]  2010  . CATARACTS Bilateral 2009  . CHOLECYSTECTOMY    . JAW BONE GRAFT  2009  . TOTAL ABDOMINAL HYSTERECTOMY W/ BILATERAL SALPINGOOPHORECTOMY       OB History   No  obstetric history on file.      Home Medications    Prior to Admission medications   Medication Sig Start Date End Date Taking? Authorizing Provider  albuterol (PROVENTIL) (2.5 MG/3ML) 0.083% nebulizer solution Take 3 mLs (2.5 mg total) by nebulization every 6 (six) hours as needed for wheezing or shortness of breath. 03/14/19   Collene Gobble, MD  ALPRAZolam Duanne Moron) 0.5 MG tablet Take 0.5 mg by mouth 3 (three) times daily as needed for anxiety or sleep. 1 every 6 hours as needed    [provider]  amLODipine (NORVASC) 5 MG tablet Take 5 mg by mouth daily.    [provider]  apixaban (ELIQUIS) 5 MG TABS tablet Take 1 tablet (5 mg total) by mouth 2 (two) times daily. 03/21/19 04/20/19  Madilyn Hook A, PA-C  atorvastatin (LIPITOR) 40 MG tablet TAKE 1 TABLET BY MOUTH ONCE DAILY 01/18/17   Nahser, Wonda Cheng, MD  calcium carbonate (OS-CAL) 600 MG TABS Take 600 mg by mouth daily.      [provider]  Cholecalciferol (PA VITAMIN D-3) 2000 UNITS CAPS Take 2,000 Units by mouth daily.     [provider]  citalopram (CELEXA) 20 MG tablet Take 40 mg by mouth daily.  09/06/14   [provider]  clonazePAM (KLONOPIN) 1 MG tablet Half tablet to whole tablet twice a day for anxiety 08/03/16  [provider]  metoprolol succinate (TOPROL-XL) 25 MG 24 hr tablet Take 1 tablet (25 mg total) by mouth 2 (two) times daily. 03/21/19 04/20/19  Madilyn Hook A, PA-C  montelukast (SINGULAIR) 10 MG tablet Take 1 tablet (10 mg total) by mouth at bedtime. 09/14/16   Javier Glazier, MD  Multiple Vitamin (MULTIVITAMIN) tablet Take 1 tablet by mouth daily.      [provider]  Tiotropium Bromide-Olodaterol (STIOLTO RESPIMAT) 2.5-2.5 MCG/ACT AERS Inhale 2 puffs into the lungs daily. 03/08/19   Collene Gobble, MD  VENTOLIN HFA 108 [phone] Base) MCG/ACT inhaler INHALE 2 PUFFS EVERY 4 HOURS AS NEEDED 02/04/16   Collene Gobble, MD    Family History Family History  Problem  Relation Age of Onset  . Cancer Mother   . Breast cancer Mother   . Heart attack Father   . Coronary artery disease Father   . Cancer Sister   . Breast cancer Sister     Social History Social History   Tobacco Use  . Smoking status: Former Smoker    Packs/day: 1.00    Years: 50.00    Pack years: 50.00    Types: Cigarettes    Quit date: 04/01/2008    Years since quitting: 10.9  . Smokeless tobacco: Never Used  . Tobacco comment: Counseled to remain smoke free  Substance Use Topics  . Alcohol use: No    Alcohol/week: 0.0 standard drinks  . Drug use: No     Allergies   Cefaclor, Clarithromycin, Erythromycin, and Iodine   Review of Systems Review of Systems  Constitutional: Negative for activity change, appetite change, chills, fatigue and fever.  HENT: Negative for sore throat.   Respiratory: Negative for cough, chest tightness and shortness of breath.   Cardiovascular: Negative for chest pain, palpitations and leg swelling.  Gastrointestinal: Negative for abdominal pain, diarrhea, nausea and vomiting.  Genitourinary: Negative for dysuria.  Musculoskeletal: Negative for arthralgias and back pain.  Skin: Negative for rash and wound.  Neurological: Negative for dizziness, syncope and light-headedness.     Physical Exam Updated Vital Signs BP 114/63   Pulse 80   Temp 98.4 F (36.9 C) (Oral)   Resp 19   Ht 5\' 6"  (1.676 m)   Wt 65.3 kg   SpO2 97%   BMI 23.24 kg/m   Physical Exam Vitals signs and nursing note reviewed.  Constitutional:      Appearance: Normal appearance.  HENT:     Head: Normocephalic.     Mouth/Throat:     Mouth: Mucous membranes are moist.     Pharynx: Oropharynx is clear.  Eyes:     Conjunctiva/sclera: Conjunctivae normal.  Cardiovascular:     Rate and Rhythm: Regular rhythm. Tachycardia present.  Pulmonary:     Effort: Pulmonary effort is normal. No respiratory distress.     Breath sounds: Wheezing present. No rales.     Comments:  Bilateral wheezes and rhonchi throughout. Abdominal:     General: Abdomen is flat.     Palpations: Abdomen is soft.  Skin:    General: Skin is dry.  Neurological:     General: No focal deficit present.     Mental Status: She is alert.  Psychiatric:        Mood and Affect: Mood normal.      ED Treatments / Results  Labs (all labs ordered are listed, but only abnormal results are displayed) Labs Reviewed  BASIC METABOLIC PANEL - Abnormal; Notable for  the following components:      Result Value   Glucose, Bld 145 (*)    All other components within normal limits  CBC  RAPID URINE DRUG SCREEN, HOSP PERFORMED    EKG None  Radiology Dg Chest 2 View  Result Date: 03/21/2019 CLINICAL DATA:  78 year old presenting with acute onset of tachycardia. Former smoker with current history of hypertension and COPD. EXAM: CHEST - 2 VIEW COMPARISON:  Lung cancer screening CT 08/11/2018 and earlier. Chest x-ray 07/12/2013 and earlier. FINDINGS: Cardiac silhouette upper normal in size for AP technique, unchanged. Thoracic aorta mildly tortuous, unchanged. Hilar and mediastinal contours otherwise unremarkable. Hyperinflation and emphysematous changes throughout both lungs, unchanged. Moderate central peribronchial thickening, unchanged. Lungs otherwise clear. No localized airspace consolidation. No pleural effusions. No pneumothorax. Normal pulmonary vascularity. Visualized bony thorax intact. IMPRESSION: COPD/emphysema. No acute cardiopulmonary disease. Electronically Signed   By: Evangeline Dakin M.D.   On: 03/21/2019 17:01    Procedures Procedures (including critical care time)  Medications Ordered in ED Medications  sodium chloride flush (NS) 0.9 % injection 3 mL (3 mLs Intravenous Not Given 03/21/19 1920)  sodium chloride 0.9 % bolus 1,000 mL (0 mLs Intravenous Stopped 03/21/19 1719)  apixaban (ELIQUIS) tablet 5 mg (5 mg Oral Given 03/21/19 1920)  metoprolol tartrate (LOPRESSOR) tablet 25 mg (25 mg  Oral Given 03/21/19 1919)     Initial Impression / Assessment and Plan / ED Course  I have reviewed the triage vital signs and the nursing notes.  Pertinent labs & imaging results that were available during my care of the patient were reviewed by me and considered in my medical decision making (see chart for details).  Clinical Course as of Mar 20 1924  Tue Mar 21, 2019  1639 Patient presenting with asymptomatic tachycardia. No hx of the same. Hemodynamically stable. EKG appears regular with ratesr in 150s.  Discussed with attending Dr. Langston Masker. Will give fluid bolus and 20mg  diltiazem bolus.    [KM]  [id number] My initial evaluation the patient appears quite comfortable complains only of mild shortness of breath which she says is her baseline, and is also wearing her baseline 3 L nasal cannula for COPD.  She denies lightheadedness, palpitations, or chest pain.  She has no idea how long her heart rate has been in this range.  She is on her way to x-ray now.  We are still waiting for an EKG however heart rate on her telemetry monitor levels in the room appear to be 100 2240 bpm and irregular.  I suspect this may be atrial fibrillation.  We will try diltiazem bolus for rate control.   [MT]  [id number] Patient spontaneously converted herself back to sinus rhythm. Still no complaints. Will consult to cardiology for recommendations of medication for d/c   [KM]  1849 Cardiology recommend eloquis 5mg  BID as well as metoprolol 25mg  BID with cards follow up outpatient.  Discussed with patient and patient's daughter at bedside.  All questions answered.   [KM]    Clinical Course User Index [KM] Alveria Apley, PA-C [MT] Langston Masker Carola Rhine, MD       CRITICAL CARE Performed by: Alveria Apley   Total critical care time: 20 minutes  Critical care time was exclusive of separately billable procedures and treating other patients.  Critical care was necessary to treat or prevent imminent or life-threatening  deterioration.  Critical care was time spent personally by me on the following activities: development of treatment plan with patient and/or  surrogate as well as nursing, discussions with consultants, evaluation of patient's response to treatment, examination of patient, obtaining history from patient or surrogate, ordering and performing treatments and interventions, ordering and review of laboratory studies, ordering and review of radiographic studies, pulse oximetry and re-evaluation of patient's condition.   Final Clinical Impressions(s) / ED Diagnoses   Final diagnoses:  Atrial fibrillation with RVR ([place])  New onset atrial fibrillation [facility])    ED Discharge Orders         Ordered    metoprolol succinate (TOPROL-XL) 25 MG 24 hr tablet  2 times daily     03/21/19 1906    apixaban (ELIQUIS) 5 MG TABS tablet  2 times daily     03/21/19 1906           Kristine Royal 03/21/19 1926    Wyvonnia Dusky, MD 03/22/19 [phone]

## 2019-03-21 NOTE — ED Notes (Signed)
Patient transported to X-ray 

## 2019-03-22 NOTE — Telephone Encounter (Signed)
[facility] has been given the information they need per Cherina's documentation below and will call back if more information is needed. Will close out this encounter as nothing is further needed at this time.

## 2019-03-23 ENCOUNTER — Encounter (HOSPITAL_COMMUNITY): Payer: Medicare Other

## 2019-03-23 ENCOUNTER — Telehealth (HOSPITAL_COMMUNITY): Payer: Self-pay | Admitting: Family Medicine

## 2019-03-24 ENCOUNTER — Telehealth: Payer: Self-pay | Admitting: Emergency Medicine

## 2019-03-24 NOTE — Telephone Encounter (Signed)
I spoke to a representative and she needed a resting oxygen saturation to send to the insurance to qualify patient for oxygen.  faxed the snapshot with the qualification numbers at rest to the number listed for [place]. Nothing further is needed.

## 2019-03-27 ENCOUNTER — Ambulatory Visit (HOSPITAL_COMMUNITY)
Admission: RE | Admit: 2019-03-27 | Discharge: 2019-03-27 | Disposition: A | Discharge status: Home / Self Care | Payer: Medicare Other | Source: Ambulatory Visit | Attending: Nurse Practitioner | Admitting: Nurse Practitioner

## 2019-03-27 ENCOUNTER — Encounter (HOSPITAL_COMMUNITY): Payer: Self-pay | Admitting: Nurse Practitioner

## 2019-03-27 ENCOUNTER — Other Ambulatory Visit: Payer: Self-pay

## 2019-03-27 VITALS — BP 102/64 | HR 63 | Ht 66.0 in | Wt 143.6 lb

## 2019-03-27 DIAGNOSIS — Z85828 Personal history of other malignant neoplasm of skin: Secondary | ICD-10-CM | POA: Insufficient documentation

## 2019-03-27 DIAGNOSIS — Z881 Allergy status to other antibiotic agents status: Secondary | ICD-10-CM | POA: Diagnosis not present

## 2019-03-27 DIAGNOSIS — I480 Paroxysmal atrial fibrillation: Secondary | ICD-10-CM | POA: Diagnosis not present

## 2019-03-27 DIAGNOSIS — Z888 Allergy status to other drugs, medicaments and biological substances status: Secondary | ICD-10-CM | POA: Insufficient documentation

## 2019-03-27 DIAGNOSIS — Z79899 Other long term (current) drug therapy: Secondary | ICD-10-CM | POA: Insufficient documentation

## 2019-03-27 DIAGNOSIS — Z7901 Long term (current) use of anticoagulants: Secondary | ICD-10-CM | POA: Diagnosis not present

## 2019-03-27 DIAGNOSIS — J449 Chronic obstructive pulmonary disease, unspecified: Secondary | ICD-10-CM | POA: Diagnosis not present

## 2019-03-27 DIAGNOSIS — Z8249 Family history of ischemic heart disease and other diseases of the circulatory system: Secondary | ICD-10-CM | POA: Diagnosis not present

## 2019-03-27 DIAGNOSIS — Z87891 Personal history of nicotine dependence: Secondary | ICD-10-CM | POA: Insufficient documentation

## 2019-03-27 DIAGNOSIS — I10 Essential (primary) hypertension: Secondary | ICD-10-CM | POA: Insufficient documentation

## 2019-03-27 DIAGNOSIS — I4891 Unspecified atrial fibrillation: Secondary | ICD-10-CM | POA: Diagnosis not present

## 2019-03-27 NOTE — Progress Notes (Signed)
Primary Care Physician: Gaynelle Arabian, MD Referring Physician: [facility] ER f/u   Ariel Gonzalez is a 78 y.o. female with a h/o COPD that went to pulmonary rehab where the nurse noted an elevted HR but the pt was asymptomatic. She was then sent to the PCP that confirmed rapid HR and was then sent to the ER. EKG confirmed probably atrial flutter or afib  at 140 bpm. She converted to SR with cardizem bolus. Cardiology recommend eloquis 5mg  BID as well as metoprolol 25mg  BID with cards follow up outpatient.  In the afib clinic pt remains in [place]. She is not having any adverse effects form new meds. She has made a f/u with Dr. Acie Fredrickson early October, her regular cardiologist.She is on daily O2 via [place]. She is coughing and short of breath in the office, her usual symptoms  with her lung disease which she states is 3.5 out of  4.  Today, she denies symptoms of palpitations, chest pain, shortness of breath, orthopnea, PND, lower extremity edema, dizziness, presyncope, syncope, or neurologic sequela. The patient is tolerating medications without difficulties and is otherwise without complaint today.   Past Medical History:  Diagnosis Date  . Allergic rhinitis   . Arthritis    CERVICAL AND LUMBER DDD  . COPD (chronic obstructive pulmonary disease) ([place])    HFA 75% p coaching July 17, 2010  . HTN (hypertension)   . Migraine   . Osteopenia   . Overactive bladder   . Panic attacks   . Squamous cell carcinoma of skin    Past Surgical History:  Procedure Laterality Date  . [place]  2010  . CATARACTS Bilateral 2009  . CHOLECYSTECTOMY    . JAW BONE GRAFT  2009  . TOTAL ABDOMINAL HYSTERECTOMY W/ BILATERAL SALPINGOOPHORECTOMY      Current Outpatient Medications  Medication Sig Dispense Refill  . albuterol (PROVENTIL) (2.5 MG/3ML) 0.083% nebulizer solution Take 3 mLs (2.5 mg total) by nebulization every 6 (six) hours as needed for wheezing or shortness of breath. 360 mL 3  . ALPRAZolam (XANAX)  0.5 MG tablet Take 0.5 mg by mouth 3 (three) times daily as needed for anxiety or sleep. 1 every 6 hours as needed    . amLODipine (NORVASC) 5 MG tablet Take 5 mg by mouth daily.    Marland Kitchen apixaban (ELIQUIS) 5 MG TABS tablet Take 1 tablet (5 mg total) by mouth 2 (two) times daily. 60 tablet 0  . atorvastatin (LIPITOR) 40 MG tablet TAKE 1 TABLET BY MOUTH ONCE DAILY 30 tablet 0  . calcium carbonate (OS-CAL) 600 MG TABS Take 600 mg by mouth daily.      . Cholecalciferol (PA VITAMIN D-3) 2000 UNITS CAPS Take 2,000 Units by mouth daily.     . citalopram (CELEXA) 20 MG tablet Take 40 mg by mouth daily.     . clonazePAM (KLONOPIN) 1 MG tablet Half tablet to whole tablet twice a day for anxiety    . metoprolol succinate (TOPROL-XL) 25 MG 24 hr tablet Take 1 tablet (25 mg total) by mouth 2 (two) times daily. 60 tablet 0  . montelukast (SINGULAIR) 10 MG tablet Take 1 tablet (10 mg total) by mouth at bedtime. 30 tablet 3  . Multiple Vitamin (MULTIVITAMIN) tablet Take 1 tablet by mouth daily.      . Tiotropium Bromide-Olodaterol (STIOLTO RESPIMAT) 2.5-2.5 MCG/ACT AERS Inhale 2 puffs into the lungs daily. 8 g 0  . VENTOLIN HFA 108 (90 Base) MCG/ACT inhaler INHALE  2 PUFFS EVERY 4 HOURS AS NEEDED 54 g 1   No current facility-administered medications for this encounter.     Allergies  Allergen Reactions  . Cefaclor     rash  . Clarithromycin     rash  . Erythromycin     rash  . Iodine     rash    Social History   Socioeconomic History  . Marital status: Widowed    Spouse name: Not on file  . Number of children: 3  . Years of education: Not on file  . Highest education level: Not on file  Occupational History  . Occupation: retired  Scientific laboratory technician  . Financial resource strain: Not on file  . Food insecurity    Worry: Not on file    Inability: Not on file  . Transportation needs    Medical: Not on file    Non-medical: Not on file  Tobacco Use  . Smoking status: Former Smoker    Packs/day:  1.00    Years: 50.00    Pack years: 50.00    Types: Cigarettes    Quit date: 04/01/2008    Years since quitting: 10.9  . Smokeless tobacco: Never Used  . Tobacco comment: Counseled to remain smoke free  Substance and Sexual Activity  . Alcohol use: No    Alcohol/week: 0.0 standard drinks  . Drug use: No  . Sexual activity: Not on file  Lifestyle  . Physical activity    Days per week: Not on file    Minutes per session: Not on file  . Stress: Not on file  Relationships  . Social Herbalist on phone: Not on file    Gets together: Not on file    Attends religious service: Not on file    Active member of club or organization: Not on file    Attends meetings of clubs or organizations: Not on file    Relationship status: Not on file  . Intimate partner violence    Fear of current or ex partner: Not on file    Emotionally abused: Not on file    Physically abused: Not on file    Forced sexual activity: Not on file  Other Topics Concern  . Not on file  Social History Narrative  . Not on file    Family History  Problem Relation Age of Onset  . Cancer Mother   . Breast cancer Mother   . Heart attack Father   . Coronary artery disease Father   . Cancer Sister   . Breast cancer Sister     ROS- All systems are reviewed and negative except as per the HPI above  Physical Exam: Vitals:   03/27/19 1048  BP: 102/64  Pulse: 63  Weight: 65.1 kg  Height: 5\' 6"  (1.676 m)   Wt Readings from Last 3 Encounters:  03/27/19 65.1 kg  03/21/19 65.3 kg  03/08/19 64.9 kg    Labs: Lab Results  Component Value Date   NA 139 03/21/2019   K 3.7 03/21/2019   CL 104 03/21/2019   CO2 25 03/21/2019   GLUCOSE 145 (H) 03/21/2019   BUN 17 03/21/2019   CREATININE 0.88 03/21/2019   CALCIUM 9.1 03/21/2019   No results found for: INR Lab Results  Component Value Date   CHOL 137 12/12/2015   HDL 86 12/12/2015   LDLCALC 36 12/12/2015   TRIG 75 12/12/2015     GEN- The  patient is well appearing,  alert and oriented x 3 today.   Head- normocephalic, atraumatic Eyes-  Sclera clear, conjunctiva pink Ears- hearing intact Oropharynx- clear Neck- supple, no JVP Lymph- no cervical lymphadenopathy Lungs- Clear to ausculation bilaterally, normal work of breathing Heart- Regular rate and rhythm, no murmurs, rubs or gallops, PMI not laterally displaced GI- soft, NT, ND, + BS Extremities- no clubbing, cyanosis, or edema MS- no significant deformity or atrophy Skin- no rash or lesion Psych- euthymic mood, full affect Neuro- strength and sensation are intact  EKG-NSR at 63 bpm, Pr int 152 ms, qrs int 80 ms, qtc 395 ms Echo- pending   Assessment and Plan: 1. New onset afib General education re afib  No triggers identified other than lung status may be driving  it She states that there was a change in her inhaler in the last couple of weeks  Continue metoprolol at current dose Does not appear to be aggravating her lung status Update echo  Zio patch offered, as she states that  she  is not aware when she is in afib  but she declined  2. CHA2DS2VASc score of 3 Continue  eliquis 5 mg bid  General  Bleeding precautions re Eliquis   F/u with Dr. Acie Fredrickson  10/2  Geroge Baseman. Emiliya Chretien, [facility] [address] [place], [place] [address] [phone]

## 2019-03-28 ENCOUNTER — Telehealth (HOSPITAL_COMMUNITY): Payer: Self-pay | Admitting: *Deleted

## 2019-03-28 ENCOUNTER — Ambulatory Visit: Payer: Medicare Other | Admitting: Physician Assistant

## 2019-03-28 ENCOUNTER — Encounter (HOSPITAL_COMMUNITY)
Admission: RE | Admit: 2019-03-28 | Discharge: 2019-03-28 | Disposition: A | Discharge status: Home / Self Care | Payer: Medicare Other | Source: Ambulatory Visit | Attending: Emergency Medicine | Admitting: Emergency Medicine

## 2019-03-28 VITALS — Wt 145.5 lb

## 2019-03-28 DIAGNOSIS — J438 Other emphysema: Secondary | ICD-10-CM

## 2019-03-28 MED ORDER — METOPROLOL SUCCINATE ER 25 MG PO TB24: 25.0000 mg | ORAL_TABLET | Freq: Every day | ORAL | 0 refills | Status: DC

## 2019-03-28 NOTE — Telephone Encounter (Signed)
Received call from [place] with pulmonary rehab stating pt was unable to do her normal amount of exercise due to extreme fatigue. Pt BP is 94/60 HR 60. Discussed with [name] NP - will decrease metoprolol to 25mg  once a day at bedtime. Left message to let pt know of these recommendations.

## 2019-03-28 NOTE — Telephone Encounter (Signed)
Per [name] NP patient may return to pulmonary rehab without restrictions from an afib standpoint.

## 2019-03-28 NOTE — Progress Notes (Signed)
Daily Session Note  Patient Details  Name: Ariel Gonzalez MRN: [id number] Date of Birth: 18-Aug-1940 Referring Provider:     Pulmonary Rehab Walk Test from 02/02/2019 in [place]  Referring Provider  Dr. Lamonte Sakai      Encounter Date: 03/28/2019  Check In:   Capillary Blood Glucose: No results found for this or any previous visit (from the past 24 hour(s)).    Social History   Tobacco Use  Smoking Status Former Smoker  . Packs/day: 1.00  . Years: 50.00  . Pack years: 50.00  . Types: Cigarettes  . Quit date: 04/01/2008  . Years since quitting: 10.9  Smokeless Tobacco Never Used  Tobacco Comment   Counseled to remain smoke free    Goals Met:  feeling very tired today, just started on metoprolol 25 mg bid and eliquis for new onset of atrial fib  Goals Unmet:  Not Applicable  Comments: Service time is from 1050 to 1135.  She felt very tired and was not able to complete her 2nd station.  She was started on metoprolol 25 mg bid and eliquis due to new onset of atrial fib.  BP 94/60 and 100/60.  I reported findings to [facility] in the A. Fib clinic.    Dr. Rush Farmer is Medical Director for Pulmonary Rehab at [facility].

## 2019-03-28 NOTE — Telephone Encounter (Signed)
Called to report that patient was extremely tired today while exercising in pulmonary rehab, BP 96/60 and 100/60, heart rate 60 and 62.  She could not complete her 2nd exercise station due to extreme fatigue.

## 2019-03-28 NOTE — Telephone Encounter (Signed)
Pt verbalized understanding of recommendations. Follow up with Dr. Acie Fredrickson as scheduled.

## 2019-03-29 ENCOUNTER — Telehealth (HOSPITAL_COMMUNITY): Payer: Self-pay | Admitting: Family Medicine

## 2019-03-30 ENCOUNTER — Encounter (HOSPITAL_COMMUNITY): Payer: Medicare Other

## 2019-04-04 ENCOUNTER — Ambulatory Visit (HOSPITAL_COMMUNITY)
Admission: RE | Admit: 2019-04-04 | Discharge: 2019-04-04 | Disposition: A | Discharge status: Home / Self Care | Payer: Medicare Other | Source: Ambulatory Visit | Attending: Nurse Practitioner | Admitting: Nurse Practitioner

## 2019-04-04 ENCOUNTER — Encounter (HOSPITAL_COMMUNITY)
Admission: RE | Admit: 2019-04-04 | Discharge: 2019-04-04 | Disposition: A | Discharge status: Home / Self Care | Payer: Medicare Other | Source: Ambulatory Visit | Attending: Emergency Medicine | Admitting: Emergency Medicine

## 2019-04-04 ENCOUNTER — Other Ambulatory Visit: Payer: Self-pay

## 2019-04-04 ENCOUNTER — Telehealth (HOSPITAL_COMMUNITY): Payer: Self-pay | Admitting: *Deleted

## 2019-04-04 ENCOUNTER — Ambulatory Visit (HOSPITAL_COMMUNITY): Admission: RE | Admit: 2019-04-04 | Payer: Medicare Other | Source: Ambulatory Visit

## 2019-04-04 DIAGNOSIS — J438 Other emphysema: Secondary | ICD-10-CM

## 2019-04-04 DIAGNOSIS — I10 Essential (primary) hypertension: Secondary | ICD-10-CM | POA: Diagnosis not present

## 2019-04-04 DIAGNOSIS — I4891 Unspecified atrial fibrillation: Secondary | ICD-10-CM | POA: Diagnosis not present

## 2019-04-04 DIAGNOSIS — Z87891 Personal history of nicotine dependence: Secondary | ICD-10-CM | POA: Insufficient documentation

## 2019-04-04 DIAGNOSIS — I351 Nonrheumatic aortic (valve) insufficiency: Secondary | ICD-10-CM | POA: Diagnosis not present

## 2019-04-04 DIAGNOSIS — I480 Paroxysmal atrial fibrillation: Secondary | ICD-10-CM

## 2019-04-04 DIAGNOSIS — J449 Chronic obstructive pulmonary disease, unspecified: Secondary | ICD-10-CM | POA: Insufficient documentation

## 2019-04-04 DIAGNOSIS — E785 Hyperlipidemia, unspecified: Secondary | ICD-10-CM | POA: Diagnosis not present

## 2019-04-04 LAB — ECHOCARDIOGRAM COMPLETE: Weight: 2328.06 oz

## 2019-04-04 NOTE — Progress Notes (Signed)
Pulmonary Individual Treatment Plan  Patient Details  Name: Ariel Gonzalez MRN: [id number] Date of Birth: [birth year] Referring Provider:     Pulmonary Rehab Walk Test from 02/02/2019 in [facility]  Referring Provider  Dr. Lamonte Sakai      Initial Encounter Date:    Pulmonary Rehab Walk Test from 02/02/2019 in [facility]  Date  02/02/19      Visit Diagnosis: Other emphysema ([place])  Patient's Home Medications on Admission:   Current Outpatient Medications:  .  albuterol (PROVENTIL) (2.5 MG/3ML) 0.083% nebulizer solution, Take 3 mLs (2.5 mg total) by nebulization every 6 (six) hours as needed for wheezing or shortness of breath., Disp: 360 mL, Rfl: 3 .  ALPRAZolam (XANAX) 0.5 MG tablet, Take 0.5 mg by mouth 3 (three) times daily as needed for anxiety or sleep. 1 every 6 hours as needed, Disp: , Rfl:  .  amLODipine (NORVASC) 5 MG tablet, Take 5 mg by mouth daily., Disp: , Rfl:  .  apixaban (ELIQUIS) 5 MG TABS tablet, Take 1 tablet (5 mg total) by mouth 2 (two) times daily., Disp: 60 tablet, Rfl: 0 .  atorvastatin (LIPITOR) 40 MG tablet, TAKE 1 TABLET BY MOUTH ONCE DAILY, Disp: 30 tablet, Rfl: 0 .  calcium carbonate (OS-CAL) 600 MG TABS, Take 600 mg by mouth daily.  , Disp: , Rfl:  .  Cholecalciferol (PA VITAMIN D-3) 2000 UNITS CAPS, Take 2,000 Units by mouth daily. , Disp: , Rfl:  .  citalopram (CELEXA) 20 MG tablet, Take 40 mg by mouth daily. , Disp: , Rfl:  .  clonazePAM (KLONOPIN) 1 MG tablet, Half tablet to whole tablet twice a day for anxiety, Disp: , Rfl:  .  metoprolol succinate (TOPROL-XL) 25 MG 24 hr tablet, Take 1 tablet (25 mg total) by mouth at bedtime., Disp: 30 tablet, Rfl: 0 .  montelukast (SINGULAIR) 10 MG tablet, Take 1 tablet (10 mg total) by mouth at bedtime., Disp: 30 tablet, Rfl: 3 .  Multiple Vitamin (MULTIVITAMIN) tablet, Take 1 tablet by mouth daily.  , Disp: , Rfl:  .  Tiotropium Bromide-Olodaterol (STIOLTO  RESPIMAT) 2.5-2.5 MCG/ACT AERS, Inhale 2 puffs into the lungs daily., Disp: 8 g, Rfl: 0 .  VENTOLIN HFA 108 (90 Base) MCG/ACT inhaler, INHALE 2 PUFFS EVERY 4 HOURS AS NEEDED, Disp: 54 g, Rfl: 1  Past Medical History: Past Medical History:  Diagnosis Date  . Allergic rhinitis   . Arthritis    CERVICAL AND LUMBER DDD  . COPD (chronic obstructive pulmonary disease) ([place])    HFA 75% p coaching July 17, 2010  . HTN (hypertension)   . Migraine   . Osteopenia   . Overactive bladder   . Panic attacks   . Squamous cell carcinoma of skin     Tobacco Use: Social History   Tobacco Use  Smoking Status Former Smoker  . Packs/day: 1.00  . Years: 50.00  . Pack years: 50.00  . Types: Cigarettes  . Quit date: 04/01/2008  . Years since quitting: 11.0  Smokeless Tobacco Never Used  Tobacco Comment   Counseled to remain smoke free    Labs: Recent Review Flowsheet Data    Labs for ITP Cardiac and Pulmonary Rehab Latest Ref Rng & Units 12/12/2015   Cholestrol 125 - 200 mg/dL 137   LDLCALC <130 mg/dL 36   HDL >=46 mg/dL 86   Trlycerides <150 mg/dL 75      Capillary Blood Glucose: No  results found for: GLUCAP   Pulmonary Assessment Scores: Pulmonary Assessment Scores    Row Name 02/02/19 1558 02/02/19 1559 02/02/19 1603     ADL UCSD   ADL Phase  -  -  Entry   SOB Score total  -  37  37     CAT Score   CAT Score  -  12  12     mMRC Score   mMRC Score  2  -  -     UCSD: Self-administered rating of dyspnea associated with activities of daily living (ADLs) 6-point scale (0 = "not at all" to 5 = "maximal or unable to do because of breathlessness")  Scoring Scores range from 0 to 120.  Minimally important difference is 5 units  CAT: CAT can identify the health impairment of COPD patients and is better correlated with disease progression.  CAT has a scoring range of zero to 40. The CAT score is classified into four groups of low (less than 10), medium (10 - 20), high (21-30) and  very high (31-40) based on the impact level of disease on health status. A CAT score over 10 suggests significant symptoms.  A worsening CAT score could be explained by an exacerbation, poor medication adherence, poor inhaler technique, or progression of COPD or comorbid conditions.  CAT MCID is 2 points  mMRC: mMRC (Modified Medical Research Council) Dyspnea Scale is used to assess the degree of baseline functional disability in patients of respiratory disease due to dyspnea. No minimal important difference is established. A decrease in score of 1 point or greater is considered a positive change.   Pulmonary Function Assessment:   Exercise Target Goals: Exercise Program Goal: Individual exercise prescription set using results from initial 6 min walk test and THRR while considering  patient's activity barriers and safety.   Exercise Prescription Goal: Initial exercise prescription builds to 30-45 minutes a day of aerobic activity, 2-3 days per week.  Home exercise guidelines will be given to patient during program as part of exercise prescription that the participant will acknowledge.  Activity Barriers & Risk Stratification:   6 Minute Walk: 6 Minute Walk    Row Name 02/02/19 1559         6 Minute Walk   Phase  Initial     Distance  1106 feet     Walk Time  6 minutes     # of Rest Breaks  0     MPH  2.09     METS  2.61     RPE  12     Perceived Dyspnea   2     VO2 Peak  7.64     Symptoms  Yes (comment)     Comments  back pain 3/10     Resting HR  74 bpm     Resting BP  118/74     Resting Oxygen Saturation   98 %     Exercise Oxygen Saturation  during 6 min walk  91 %     Max Ex. HR  115 bpm     Max Ex. BP  128/78     2 Minute Post BP  104/70       Interval HR   1 Minute HR  93     2 Minute HR  97     3 Minute HR  107     4 Minute HR  111     5 Minute HR  115  6 Minute HR  115     2 Minute Post HR  89     Interval Heart Rate?  Yes       Interval Oxygen    Interval Oxygen?  Yes     Baseline Oxygen Saturation %  98 %     1 Minute Oxygen Saturation %  97 %     1 Minute Liters of Oxygen  3 L     2 Minute Oxygen Saturation %  95 %     2 Minute Liters of Oxygen  3 L     3 Minute Oxygen Saturation %  93 %     3 Minute Liters of Oxygen  3 L     4 Minute Oxygen Saturation %  91 %     4 Minute Liters of Oxygen  3 L     5 Minute Oxygen Saturation %  91 %     5 Minute Liters of Oxygen  3 L     6 Minute Oxygen Saturation %  92 %     6 Minute Liters of Oxygen  3 L     2 Minute Post Oxygen Saturation %  97 %     2 Minute Post Liters of Oxygen  3 L        Oxygen Initial Assessment: Oxygen Initial Assessment - 02/02/19 1557      Home Oxygen   Home Oxygen Device  E-Tanks;Home Concentrator    Sleep Oxygen Prescription  None    Home Exercise Oxygen Prescription  Continuous    Liters per minute  3    Home at Rest Exercise Oxygen Prescription  None    Compliance with Home Oxygen Use  Yes      Initial 6 min Walk   Oxygen Used  Continuous    Liters per minute  3      Program Oxygen Prescription   Program Oxygen Prescription  Continuous    Liters per minute  3       Oxygen Re-Evaluation: Oxygen Re-Evaluation    Row Name 03/07/19 0704 04/03/19 0919           Program Oxygen Prescription   Program Oxygen Prescription  Continuous  Continuous      Liters per minute  3  3        Home Oxygen   Home Oxygen Device  Home Concentrator;E-Tanks  Home Concentrator;E-Tanks      Sleep Oxygen Prescription  None  None      Home Exercise Oxygen Prescription  Continuous  Continuous      Liters per minute  3  3      Home at Rest Exercise Oxygen Prescription  Continuous  Continuous      Liters per minute  3  3      Compliance with Home Oxygen Use  Yes  Yes        Goals/Expected Outcomes   Short Term Goals  To learn and exhibit compliance with exercise, home and travel O2 prescription;To learn and understand importance of monitoring SPO2 with pulse  oximeter and demonstrate accurate use of the pulse oximeter.;To learn and understand importance of maintaining oxygen saturations>88%;To learn and demonstrate proper pursed lip breathing techniques or other breathing techniques.;To learn and demonstrate proper use of respiratory medications  To learn and exhibit compliance with exercise, home and travel O2 prescription;To learn and understand importance of monitoring SPO2 with pulse oximeter and demonstrate accurate use of the pulse oximeter.;To learn  and understand importance of maintaining oxygen saturations>88%;To learn and demonstrate proper pursed lip breathing techniques or other breathing techniques.;To learn and demonstrate proper use of respiratory medications      Long  Term Goals  Compliance with respiratory medication;Exhibits proper breathing techniques, such as pursed lip breathing or other method taught during program session;Maintenance of O2 saturations>88%;Verbalizes importance of monitoring SPO2 with pulse oximeter and return demonstration;Exhibits compliance with exercise, home and travel O2 prescription  Compliance with respiratory medication;Exhibits proper breathing techniques, such as pursed lip breathing or other method taught during program session;Maintenance of O2 saturations>88%;Verbalizes importance of monitoring SPO2 with pulse oximeter and return demonstration;Exhibits compliance with exercise, home and travel O2 prescription      Goals/Expected Outcomes  compliance  compliance         Oxygen Discharge (Final Oxygen Re-Evaluation): Oxygen Re-Evaluation - 04/03/19 0919      Program Oxygen Prescription   Program Oxygen Prescription  Continuous    Liters per minute  3      Home Oxygen   Home Oxygen Device  Home Concentrator;E-Tanks    Sleep Oxygen Prescription  None    Home Exercise Oxygen Prescription  Continuous    Liters per minute  3    Home at Rest Exercise Oxygen Prescription  Continuous    Liters per minute   3    Compliance with Home Oxygen Use  Yes      Goals/Expected Outcomes   Short Term Goals  To learn and exhibit compliance with exercise, home and travel O2 prescription;To learn and understand importance of monitoring SPO2 with pulse oximeter and demonstrate accurate use of the pulse oximeter.;To learn and understand importance of maintaining oxygen saturations>88%;To learn and demonstrate proper pursed lip breathing techniques or other breathing techniques.;To learn and demonstrate proper use of respiratory medications    Long  Term Goals  Compliance with respiratory medication;Exhibits proper breathing techniques, such as pursed lip breathing or other method taught during program session;Maintenance of O2 saturations>88%;Verbalizes importance of monitoring SPO2 with pulse oximeter and return demonstration;Exhibits compliance with exercise, home and travel O2 prescription    Goals/Expected Outcomes  compliance       Initial Exercise Prescription: Initial Exercise Prescription - 02/02/19 1600      Date of Initial Exercise RX and Referring Provider   Date  02/02/19    Referring Provider  Dr. Lamonte Sakai      Oxygen   Oxygen  Continuous    Liters  3      Recumbant Bike   Level  1    Minutes  15      NuStep   Level  2    SPM  80    Minutes  15      Prescription Details   Frequency (times per week)  2    Duration  Progress to 30 minutes of continuous aerobic without signs/symptoms of physical distress      Intensity   THRR 40-80% of Max Heartrate  57-114    Ratings of Perceived Exertion  11-13    Perceived Dyspnea  0-4      Progression   Progression  Continue to progress workloads to maintain intensity without signs/symptoms of physical distress.      Resistance Training   Training Prescription  Yes    Weight  orange bands    Reps  10-15       Perform Capillary Blood Glucose checks as needed.  Exercise Prescription Changes: Exercise Prescription Changes    Row  Name  02/07/19 1200 02/21/19 1100 03/07/19 1200 03/16/19 1145       Response to Exercise   Blood Pressure (Admit)  108/64  110/50  100/60  112/52    Blood Pressure (Exercise)  140/70  130/66  140/66  122/80    Blood Pressure (Exit)  126/70  104/60  122/74  112/60    Heart Rate (Admit)  81 bpm  89 bpm  92 bpm  86 bpm    Heart Rate (Exercise)  106 bpm  100 bpm  104 bpm  105 bpm    Heart Rate (Exit)  91 bpm  94 bpm  96 bpm  96 bpm    Oxygen Saturation (Admit)  98 %  95 %  96 %  96 %    Oxygen Saturation (Exercise)  94 %  96 %  95 %  94 %    Oxygen Saturation (Exit)  95 %  97 %  96 %  95 %    Rating of Perceived Exertion (Exercise)  _0 Perceived Dyspnea (Exercise)  _1 Duration  Progress to 30 minutes of  aerobic without signs/symptoms of physical distress  Continue with 30 min of aerobic exercise without signs/symptoms of physical distress.  Continue with 30 min of aerobic exercise without signs/symptoms of physical distress.  Continue with 30 min of aerobic exercise without signs/symptoms of physical distress.    Intensity  - 40-80% HRR  THRR unchanged  THRR unchanged  THRR unchanged      Progression   Progression  Continue to progress workloads to maintain intensity without signs/symptoms of physical distress.  Continue to progress workloads to maintain intensity without signs/symptoms of physical distress.  Continue to progress workloads to maintain intensity without signs/symptoms of physical distress.  Continue to progress workloads to maintain intensity without signs/symptoms of physical distress.      Resistance Training   Training Prescription  Yes  Yes  Yes  Yes    Weight  orange bands  orange bands  orange bands  orange bands    Reps  10-15  10-15  10-15  10-15    Time  10 Minutes  10 Minutes  10 Minutes  10 Minutes      Oxygen   Oxygen  Continuous  Continuous  Continuous  Continuous    Liters  _2 NuStep   Level  _3 SPM  80  80  80   80    Minutes  _4 METs  2  2.1  2.2  2.2      Arm Ergometer   Level  1  1  1.5  1.5    Minutes  _5 Exercise Comments:   Exercise Goals and Review: Exercise Goals    Row Name 04/03/19 0921             Exercise Goals   Increase Physical Activity  Yes       Intervention  Provide advice, education, support and counseling about physical activity/exercise needs.;Develop an individualized exercise prescription for aerobic and resistive training based on initial evaluation findings, risk stratification, comorbidities and participant's personal goals.       Expected Outcomes  Short Term: Attend rehab on a regular basis to increase amount of physical activity.;Long Term: Add in home exercise to make exercise part of routine and to increase amount of physical activity.;Long Term: Exercising regularly at least 3-5 days a week.       Increase Strength and Stamina  Yes       Intervention  Provide advice, education, support and counseling about physical activity/exercise needs.;Develop an individualized exercise prescription for aerobic and resistive training based on initial evaluation findings, risk stratification, comorbidities and participant's personal goals.       Expected Outcomes  Short Term: Increase workloads from initial exercise prescription for resistance, speed, and METs.;Short Term: Perform resistance training exercises routinely during rehab and add in resistance training at home;Long Term: Improve cardiorespiratory fitness, muscular endurance and strength as measured by increased METs and functional capacity (6MWT)       Able to understand and use rate of perceived exertion (RPE) scale  Yes       Intervention  Provide education and explanation on how to use RPE scale       Expected Outcomes  Short Term: Able to use RPE daily in rehab to express subjective intensity level;Long Term:  Able to use RPE to guide intensity level when exercising independently        Able to understand and use Dyspnea scale  Yes       Intervention  Provide education and explanation on how to use Dyspnea scale       Expected Outcomes  Short Term: Able to use Dyspnea scale daily in rehab to express subjective sense of shortness of breath during exertion;Long Term: Able to use Dyspnea scale to guide intensity level when exercising independently       Knowledge and understanding of Target Heart Rate Range (THRR)  Yes       Expected Outcomes  Short Term: Able to state/look up THRR;Short Term: Able to use daily as guideline for intensity in rehab;Long Term: Able to use THRR to govern intensity when exercising independently       Understanding of Exercise Prescription  Yes       Intervention  Provide education, explanation, and written materials on patient's individual exercise prescription       Expected Outcomes  Short Term: Able to explain program exercise prescription;Long Term: Able to explain home exercise prescription to exercise independently          Exercise Goals Re-Evaluation : Exercise Goals Re-Evaluation    Row Name 03/07/19 0706 04/03/19 0922           Exercise Goal Re-Evaluation   Exercise Goals Review  Increase Physical Activity;Increase Strength and Stamina;Able to understand and use rate of perceived exertion (RPE) scale;Able to understand and use Dyspnea scale;Knowledge and understanding of Target Heart Rate Range (THRR);Understanding of Exercise Prescription  Increase Physical Activity;Increase Strength and Stamina;Able to understand and use rate of perceived exertion (RPE) scale;Able to understand and use Dyspnea scale;Knowledge and understanding of Target Heart Rate Range (THRR);Understanding of Exercise Prescription      Comments  Pt has completed 7 exercise sessions. Pt had not exercised since high school upon joining the program and was deconditioned. Progress has been slow, but pt has been able to increase METs on the stepper from 1.8 to 2.3. Pt has  a positive attitude and is accepting of workload increases. Will continue to monitor and progress as able.  Pt has completed 13 exercise sessions. Pt was making good progress then she had  new onset Afib which was caught during a rehab session. Pt has since been put on meds which have decreased her energy. Her meds have been reduced. Hopefully she will be back to normal this week, as she graduates this week. She was exercising at 2.2 METs before her most recent problems. Will continue to monitor and progress as able.      Expected Outcomes  Through exercise at rehab and at home, the patient will decrease shortness of breath with daily activities and feel confident in carrying out an exercise regime at home.  Through exercise at rehab and at home, the patient will decrease shortness of breath with daily activities and feel confident in carrying out an exercise regime at home.         Discharge Exercise Prescription (Final Exercise Prescription Changes): Exercise Prescription Changes - 03/16/19 1145      Response to Exercise   Blood Pressure (Admit)  112/52    Blood Pressure (Exercise)  122/80    Blood Pressure (Exit)  112/60    Heart Rate (Admit)  86 bpm    Heart Rate (Exercise)  105 bpm    Heart Rate (Exit)  96 bpm    Oxygen Saturation (Admit)  96 %    Oxygen Saturation (Exercise)  94 %    Oxygen Saturation (Exit)  95 %    Rating of Perceived Exertion (Exercise)  13    Perceived Dyspnea (Exercise)  3    Duration  Continue with 30 min of aerobic exercise without signs/symptoms of physical distress.    Intensity  THRR unchanged      Progression   Progression  Continue to progress workloads to maintain intensity without signs/symptoms of physical distress.      Resistance Training   Training Prescription  Yes    Weight  orange bands    Reps  10-15    Time  10 Minutes      Oxygen   Oxygen  Continuous    Liters  3      NuStep   Level  3    SPM  80    Minutes  15    METs  2.2       Arm Ergometer   Level  1.5    Minutes  15       Nutrition:  Target Goals: Understanding of nutrition guidelines, daily intake of sodium <1569m, cholesterol <2073m calories 30% from fat and 7% or less from saturated fats, daily to have 5 or more servings of fruits and vegetables.  Biometrics: Pre Biometrics - 02/02/19 1558      Pre Biometrics   Grip Strength  33 kg        Nutrition Therapy Plan and Nutrition Goals:   Nutrition Assessments:   Nutrition Goals Re-Evaluation:   Nutrition Goals Discharge (Final Nutrition Goals Re-Evaluation):   Psychosocial: Target Goals: Acknowledge presence or absence of significant depression and/or stress, maximize coping skills, provide positive support system. Participant is able to verbalize types and ability to use techniques and skills needed for reducing stress and depression.  Initial Review & Psychosocial Screening: Initial Psych Review & Screening - 03/06/19 1020      Initial Review   Current issues with  None Identified      Family Dynamics   Good Support System?  Yes      Barriers   Psychosocial barriers to participate in program  There are no identifiable barriers or psychosocial needs.      Screening  Interventions   Interventions  Encouraged to exercise    Expected Outcomes  Long Term goal: The participant improves quality of Life and PHQ9 Scores as seen by post scores and/or verbalization of changes;Long Term Goal: Stressors or current issues are controlled or eliminated.       Quality of Life Scores:  Scores of 19 and below usually indicate a poorer quality of life in these areas.  A difference of  2-3 points is a clinically meaningful difference.  A difference of 2-3 points in the total score of the Quality of Life Index has been associated with significant improvement in overall quality of life, self-image, physical symptoms, and general health in studies assessing change in quality of life.  PHQ-9: Recent  Review Flowsheet Data    Depression screen [facility] 2/9 02/02/2019 02/02/2019   Decreased Interest 0 0   Down, Depressed, Hopeless 0 0   PHQ - 2 Score 0 0   Altered sleeping 0 -   Tired, decreased energy 0 -   Change in appetite 0 -   Feeling bad or failure about yourself  0 -   Trouble concentrating 0 -   Moving slowly or fidgety/restless 0 -   Suicidal thoughts 0 -   PHQ-9 Score 0 -   Difficult doing work/chores Not difficult at all -     Interpretation of Total Score  Total Score Depression Severity:  1-4 = Minimal depression, 5-9 = Mild depression, 10-14 = Moderate depression, 15-19 = Moderately severe depression, 20-27 = Severe depression   Psychosocial Evaluation and Intervention: Psychosocial Evaluation - 02/02/19 1334      Psychosocial Evaluation & Interventions   Interventions  --   no interventions required      Psychosocial Re-Evaluation: Psychosocial Re-Evaluation    Row Name 02/02/19 1335 03/06/19 1021 04/03/19 1331         Psychosocial Re-Evaluation   Current issues with  None Identified  None Identified  None Identified     Comments  -  no barriers to participation in pulmonary rehab  No barriers or psychosocial concerns for participation in pulmonary rehab     Expected Outcomes  -  Continues to have no barriers to participation in pulmonary rehab  Continues to have no barriers or psychosocial concerns while exercising in pulmonary rehab.     Interventions  -  Encouraged to attend Pulmonary Rehabilitation for the exercise  Encouraged to attend Pulmonary Rehabilitation for the exercise     Continue Psychosocial Services   No Follow up required  No Follow up required  No Follow up required        Psychosocial Discharge (Final Psychosocial Re-Evaluation): Psychosocial Re-Evaluation - 04/03/19 1331      Psychosocial Re-Evaluation   Current issues with  None Identified    Comments  No barriers or psychosocial concerns for participation in pulmonary rehab     Expected Outcomes  Continues to have no barriers or psychosocial concerns while exercising in pulmonary rehab.    Interventions  Encouraged to attend Pulmonary Rehabilitation for the exercise    Continue Psychosocial Services   No Follow up required       Education: Education Goals: Education classes will be provided on a weekly basis, covering required topics. Participant will state understanding/return demonstration of topics presented.  Learning Barriers/Preferences: Learning Barriers/Preferences - 02/02/19 1335      Learning Barriers/Preferences   Learning Barriers  None    Learning Preferences  Audio;Computer/Internet;Group Instruction;Individual Instruction;Pictoral;Skilled Demonstration;Verbal Instruction;Video;Written Material  Education Topics: Risk Factor Reduction:  -Group instruction that is supported by a PowerPoint presentation. Instructor discusses the definition of a risk factor, different risk factors for pulmonary disease, and how the heart and lungs work together.     Nutrition for Pulmonary Patient:  -Group instruction provided by PowerPoint slides, verbal discussion, and written materials to support subject matter. The instructor gives an explanation and review of healthy diet recommendations, which includes a discussion on weight management, recommendations for fruit and vegetable consumption, as well as protein, fluid, caffeine, fiber, sodium, sugar, and alcohol. Tips for eating when patients are short of breath are discussed.   Pursed Lip Breathing:  -Group instruction that is supported by demonstration and informational handouts. Instructor discusses the benefits of pursed lip and diaphragmatic breathing and detailed demonstration on how to preform both.     Oxygen Safety:  -Group instruction provided by PowerPoint, verbal discussion, and written material to support subject matter. There is an overview of "What is Oxygen" and "Why do we need it".   Instructor also reviews how to create a safe environment for oxygen use, the importance of using oxygen as prescribed, and the risks of noncompliance. There is a brief discussion on traveling with oxygen and resources the patient may utilize.   Oxygen Equipment:  -Group instruction provided by [facility] Staff utilizing handouts, written materials, and equipment demonstrations.   Signs and Symptoms:  -Group instruction provided by written material and verbal discussion to support subject matter. Warning signs and symptoms of infection, stroke, and heart attack are reviewed and when to call the physician/911 reinforced. Tips for preventing the spread of infection discussed.   Advanced Directives:  -Group instruction provided by verbal instruction and written material to support subject matter. Instructor reviews Advanced Directive laws and proper instruction for filling out document.   Pulmonary Video:  -Group video education that reviews the importance of medication and oxygen compliance, exercise, good nutrition, pulmonary hygiene, and pursed lip and diaphragmatic breathing for the pulmonary patient.   Exercise for the Pulmonary Patient:  -Group instruction that is supported by a PowerPoint presentation. Instructor discusses benefits of exercise, core components of exercise, frequency, duration, and intensity of an exercise routine, importance of utilizing pulse oximetry during exercise, safety while exercising, and options of places to exercise outside of rehab.     Pulmonary Medications:  -Verbally interactive group education provided by instructor with focus on inhaled medications and proper administration.   Anatomy and Physiology of the Respiratory System and Intimacy:  -Group instruction provided by PowerPoint, verbal discussion, and written material to support subject matter. Instructor reviews respiratory cycle and anatomical components of the respiratory system and their  functions. Instructor also reviews differences in obstructive and restrictive respiratory diseases with examples of each. Intimacy, Sex, and Sexuality differences are reviewed with a discussion on how relationships can change when diagnosed with pulmonary disease. Common sexual concerns are reviewed.   MD DAY -A group question and answer session with a medical doctor that allows participants to ask questions that relate to their pulmonary disease state.   OTHER EDUCATION -Group or individual verbal, written, or video instructions that support the educational goals of the pulmonary rehab program.   Holiday Eating Survival Tips:  -Group instruction provided by PowerPoint slides, verbal discussion, and written materials to support subject matter. The instructor gives patients tips, tricks, and techniques to help them not only survive but enjoy the holidays despite the onslaught of food that accompanies the holidays.   Knowledge  Questionnaire Score: Knowledge Questionnaire Score - 02/02/19 1604      Knowledge Questionnaire Score   Pre Score  15/18       Core Components/Risk Factors/Patient Goals at Admission: Personal Goals and Risk Factors at Admission - 02/02/19 1336      Core Components/Risk Factors/Patient Goals on Admission    Weight Management  Weight Loss    Improve shortness of breath with ADL's  Yes    Intervention  Provide education, individualized exercise plan and daily activity instruction to help decrease symptoms of SOB with activities of daily living.    Expected Outcomes  Short Term: Improve cardiorespiratory fitness to achieve a reduction of symptoms when performing ADLs;Long Term: Be able to perform more ADLs without symptoms or delay the onset of symptoms       Core Components/Risk Factors/Patient Goals Review:  Goals and Risk Factor Review    Row Name 02/02/19 1604 03/06/19 1023 04/03/19 1333         Core Components/Risk Factors/Patient Goals Review    Personal Goals Review  Weight Management/Obesity;Increase knowledge of respiratory medications and ability to use respiratory devices properly.;Improve shortness of breath with ADL's;Develop more efficient breathing techniques such as purse lipped breathing and diaphragmatic breathing and practicing self-pacing with activity.  Weight Management/Obesity;Improve shortness of breath with ADL's;Develop more efficient breathing techniques such as purse lipped breathing and diaphragmatic breathing and practicing self-pacing with activity.;Increase knowledge of respiratory medications and ability to use respiratory devices properly.  Increase knowledge of respiratory medications and ability to use respiratory devices properly.;Improve shortness of breath with ADL's;Develop more efficient breathing techniques such as purse lipped breathing and diaphragmatic breathing and practicing self-pacing with activity.     Review  -  Maintaing weight, extremely short of breath with any activity.  She has never exercised prior to coming to pulmonary rehab.  She is very slow to progress d/t her emphysema and deconditioning.  She is on level 3 of the nustep and level 1.5 of the arm ergomenter  Her exercise progression has been slow due to her emphysema disease process.  She developed atrial fib while in program and was placed on metoprolol 25 mg bid which she tolerated poorly and had to be reduced to 25 mg at night.  She will return to exercise tomorrow and we will see how she tolerates it.     Expected Outcomes  -  See admission goals.  See admission goals.        Core Components/Risk Factors/Patient Goals at Discharge (Final Review):  Goals and Risk Factor Review - 04/03/19 1333      Core Components/Risk Factors/Patient Goals Review   Personal Goals Review  Increase knowledge of respiratory medications and ability to use respiratory devices properly.;Improve shortness of breath with ADL's;Develop more efficient breathing  techniques such as purse lipped breathing and diaphragmatic breathing and practicing self-pacing with activity.    Review  Her exercise progression has been slow due to her emphysema disease process.  She developed atrial fib while in program and was placed on metoprolol 25 mg bid which she tolerated poorly and had to be reduced to 25 mg at night.  She will return to exercise tomorrow and we will see how she tolerates it.    Expected Outcomes  See admission goals.       ITP Comments:   Comments: ITP REVIEW Pt is making expected progress toward pulmonary rehab goals after completing 13 sessions. Recommend continued exercise, life style modification, education, and utilization of  breathing techniques to increase stamina and strength and decrease shortness of breath with exertion.

## 2019-04-04 NOTE — Progress Notes (Signed)
  Echocardiogram 2D Echocardiogram has been performed.  Ariel Gonzalez 04/04/2019, 3:39 PM

## 2019-04-05 ENCOUNTER — Encounter (HOSPITAL_COMMUNITY): Payer: Self-pay | Admitting: *Deleted

## 2019-04-06 ENCOUNTER — Telehealth: Payer: Self-pay | Admitting: Emergency Medicine

## 2019-04-06 ENCOUNTER — Encounter (HOSPITAL_COMMUNITY): Payer: Medicare Other

## 2019-04-06 MED ORDER — PREDNISONE 10 MG PO TABS: ORAL_TABLET | ORAL | 0 refills | Status: DC

## 2019-04-06 MED ORDER — DOXYCYCLINE HYCLATE 100 MG PO TABS: 100.0000 mg | ORAL_TABLET | Freq: Two times a day (BID) | ORAL | 0 refills | Status: DC

## 2019-04-06 NOTE — Telephone Encounter (Signed)
Primary Pulmonologist: By[place] Last office visit and with whom: 03/08/19 RB  What do we see them for (pulmonary problems): COPD Last OV assessment/plan: Instructions  Please stop Symbicort and Spiriva for now. We will try starting Stiolto 2 puffs once daily to see if you get more benefit.  Please call when the samples run out to let us know if you have benefited.  If so then we will call this medication to your pharmacy. Keep your albuterol available to use either 1 nebulizer treatment or 2 puffs up to every 4 hours if needed for shortness of breath, chest tightness, wheezing. Continue your oxygen at up to 3.5 L/min when you are exerting herself. Continue pulmonary rehabilitation Your pneumonia shots are up-to-date Flu shot today. Follow with Dr Lamonte Sakai in 6 months or sooner if you have any problems       Was appointment offered to patient (explain)?  Would rather something called in or come to office   Reason for call: wheezing, shortness of breath. Taking nebulizer 4x a day, increased her O2 to 4L state prednisone helped last time  Dr. Lamonte Sakai please advise

## 2019-04-06 NOTE — Telephone Encounter (Signed)
Called and spoke to patient.  Patient was very short of breath and could hear her wheezing.  Patient stated she was going to use a breathing treatment.  Let her know sending in Prednisone and Doxycline per Dr. Lamonte Sakai.  Patient verbalized understanding and reported her daughter will pick up her Rxs. Advised patient if she needs to go to the emergency room to do so.  Nothing further needed at this time.

## 2019-04-06 NOTE — Telephone Encounter (Signed)
OK to send prednisone as follows: Take 40mg  daily for 3 days, then 30mg  daily for 3 days, then 20mg  daily for 3 days, then 10mg  daily for 3 days, then stop Please also give her doxycycline 10omg bid x7days

## 2019-04-12 ENCOUNTER — Telehealth (HOSPITAL_COMMUNITY): Payer: Self-pay | Admitting: Family Medicine

## 2019-04-13 ENCOUNTER — Telehealth (HOSPITAL_COMMUNITY): Payer: Self-pay | Admitting: *Deleted

## 2019-04-14 ENCOUNTER — Other Ambulatory Visit: Payer: Self-pay

## 2019-04-14 ENCOUNTER — Encounter: Payer: Self-pay | Admitting: Cardiovascular Disease

## 2019-04-14 ENCOUNTER — Ambulatory Visit (INDEPENDENT_AMBULATORY_CARE_PROVIDER_SITE_OTHER): Payer: Medicare Other | Admitting: Cardiovascular Disease

## 2019-04-14 DIAGNOSIS — I480 Paroxysmal atrial fibrillation: Secondary | ICD-10-CM | POA: Diagnosis not present

## 2019-04-14 MED ORDER — DILTIAZEM HCL ER COATED BEADS 120 MG PO CP24: 120.0000 mg | ORAL_CAPSULE | Freq: Every day | ORAL | 3 refills | Status: DC

## 2019-04-14 NOTE — Progress Notes (Signed)
Cardiology Office Note   Date:  04/14/2019   ID:  Ariel Gonzalez, DOB 1941/02/19, MRN [id number]  PCP:  Ariel Arabian, MD  Cardiologist:    Ariel Moores, MD   No chief complaint on file.  Problem List 1. COPD 2. Coronary Artery Calcification 3.  Paroxysmal Atrial fib   Previous notes.  Ariel Gonzalez is a 78 y.o. female who presents for coronary artery calcifications . Mother of Ariel Gonzalez .  Records from Dr. Marisue Gonzalez were reviewed.  She has a history of smoking. A CT scan was recently performed to evaluate her for the possibility of lung cancer. She was found have coronary artery calcifications.  Has had a chronic cough.  Does not exercise  - has COPD  Uses home O2 at times - especially after being in the shower.   Stays active.  No syncope.  Lives at the [facility] in [place], laundry, shopping - never has CP .  stopped smoking - 2 years   12/18/2015: Ariel Gonzalez  presents today for follow-up of her coronary calcifications. She's had a stress Myoview study that was low risk and she has no ischemia. She has normal left trigger systolic function with an ejection fraction of 66%.  No CP , chronic COPD - unable to walk  April 14, 2019:  And is seen today after 3-year absence.   Ariel Gonzalez's mother )   We were asked to see her by Dr. Marisue Gonzalez for further eval and management of her paroxysmal atrial fib.   She has a history of COPD.  She was at pulmonary rehab and the rehab nurse noted an elevated heart rate.  She was asymptomatic.  She was sent to her PCP and she was confirmed to have atrial fibrillation.  Her heart rate was 140.  She converted to sinus rhythm with a Cardizem bolus.  She was started on Eliquis 5 mg twice a day and metoprolol 25 mg twice a day with follow-up in the A. fib clinic.  She was seen by Ariel Gonzalez on September 14.  She was in sinus rhythm at that time.  Sees Ariel Gonzalez for pulmonary .  Developed a cough this past month .  Was wheezing    Started using a nebulizer which has helped   Echo from Sept. 20, 2020 .  Shows normal LV systolic function Mild diastolic dysfunction   Past Medical History:  Diagnosis Date  . Allergic rhinitis   . Arthritis    CERVICAL AND LUMBER DDD  . COPD (chronic obstructive pulmonary disease) ([place])    HFA 75% p coaching July 17, 2010  . HTN (hypertension)   . Migraine   . Osteopenia   . Overactive bladder   . Panic attacks   . Squamous cell carcinoma of skin     Past Surgical History:  Procedure Laterality Date  . [place]  2010  . CATARACTS Bilateral 2009  . CHOLECYSTECTOMY    . JAW BONE GRAFT  2009  . TOTAL ABDOMINAL HYSTERECTOMY W/ BILATERAL SALPINGOOPHORECTOMY       Current Outpatient Medications  Medication Sig Dispense Refill  . albuterol (PROVENTIL) (2.5 MG/3ML) 0.083% nebulizer solution Take 3 mLs (2.5 mg total) by nebulization every 6 (six) hours as needed for wheezing or shortness of breath. 360 mL 3  . ALPRAZolam (XANAX) 0.5 MG tablet Take 0.5 mg by mouth 3 (three) times daily as needed for anxiety or sleep. 1 every 6 hours as needed    .  amLODipine (NORVASC) 5 MG tablet Take 5 mg by mouth daily.    Marland Kitchen apixaban (ELIQUIS) 5 MG TABS tablet Take 1 tablet (5 mg total) by mouth 2 (two) times daily. 60 tablet 0  . atorvastatin (LIPITOR) 40 MG tablet TAKE 1 TABLET BY MOUTH ONCE DAILY 30 tablet 0  . buPROPion (WELLBUTRIN XL) 150 MG 24 hr tablet Take 1 tablet by mouth daily.    . calcium carbonate (OS-CAL) 600 MG TABS Take 600 mg by mouth daily.      . Cholecalciferol (PA VITAMIN D-3) 2000 UNITS CAPS Take 2,000 Units by mouth daily.     . citalopram (CELEXA) 20 MG tablet Take 40 mg by mouth daily.     . clonazePAM (KLONOPIN) 1 MG tablet Half tablet to whole tablet twice a day for anxiety    . doxycycline (VIBRA-TABS) 100 MG tablet Take 1 tablet (100 mg total) by mouth 2 (two) times daily. 14 tablet 0  . montelukast (SINGULAIR) 10 MG tablet Take 1 tablet (10 mg total) by  mouth at bedtime. 30 tablet 3  . Multiple Vitamin (MULTIVITAMIN) tablet Take 1 tablet by mouth daily.      . OXYGEN Inhale 3.5 L into the lungs continuous.    . predniSONE (DELTASONE) 10 MG tablet 40 mg X 3 days, 30mg  X 3 days, 20mg  X 3 days, 10mg  X 3 days, then stop 30 tablet 0  . Tiotropium Bromide-Olodaterol (STIOLTO RESPIMAT) 2.5-2.5 MCG/ACT AERS Inhale 2 puffs into the lungs daily. 8 g 0  . VENTOLIN HFA 108 (90 Base) MCG/ACT inhaler INHALE 2 PUFFS EVERY 4 HOURS AS NEEDED 54 g 1  . diltiazem (CARDIZEM CD) 120 MG 24 hr capsule Take 1 capsule (120 mg total) by mouth daily. 90 capsule 3   No current facility-administered medications for this visit.     Allergies:   Cefaclor, Clarithromycin, Erythromycin, and Iodine    Social History:  The patient  reports that she quit smoking about 11 years ago. Her smoking use included cigarettes. She has a 50.00 pack-year smoking history. She has never used smokeless tobacco. She reports that she does not drink alcohol or use drugs.   Family History:  The patient's family history includes Breast cancer in her mother and sister; Cancer in her mother and sister; Coronary artery disease in her father; Heart attack in her father.   Father died at age 17 due to MI .    ROS:  Please see the history of present illness.      Physical Exam: Blood pressure 128/82, pulse 73, height 5\' 6"  (1.676 m), weight 144 lb (65.3 kg), SpO2 91 %.  GEN:   Elderly female,  Mild - moderte distress ( actively wheezing )  HEENT: Normal NECK: No JVD; No carotid bruits LYMPHATICS: No lymphadenopathy CARDIAC: RRR   RESPIRATORY:   Tight bilateral wheezing  ABDOMEN: Soft, non-tender, non-distended MUSCULOSKELETAL:  No edema; No deformity  SKIN: Warm and dry NEUROLOGIC:  Alert and oriented x 3   EKG:  EKG is not ordered today.  Recent Labs: 03/21/2019: BUN 17; Creatinine, Ser 0.88; Hemoglobin 14.4; Platelets 269; Potassium 3.7; Sodium 139    Lipid Panel    Component  Value Date/Time   CHOL 137 12/12/2015 0919   TRIG 75 12/12/2015 0919   HDL 86 12/12/2015 0919   CHOLHDL 1.6 12/12/2015 0919   VLDL 15 12/12/2015 0919   LDLCALC 36 12/12/2015 0919     Labs from [place] office shows an LDL of 135,  total of 230   Wt Readings from Last 3 Encounters:  04/14/19 144 lb (65.3 kg)  04/04/19 145 lb 8.1 oz (66 kg)  03/27/19 143 lb 9.6 oz (65.1 kg)      Other studies Reviewed: Additional studies/ records that were reviewed today include: . Review of the above records demonstrates:    ASSESSMENT AND PLAN:  1.  Coronary artery calcifications Mayana has A long history of smoking.  She had a recent CT scan and was found to have coronary artery calcifications.  Stress myoview was low risk  . Marland Kitchen   2. Hyperlipidemia:   LDL is 135.   Has evidence of CAD .   Goal is 70  She is been on atorvastatin. Cholesterol levels from June, 2017 reveal a total cholesterol of 137.  HDL is 86.  LDL is 36.    3.  Atrial fibrillation: She presents with a recent history of paroxysmal atrial fibrillation.  She recently has had been having lots more trouble with her COPD.  She has had lots of coughing and a COPD exacerbation.  She is been placed on antibiotics and also has been started on nebulizer.  Throughout all this she was found to have an episode of atrial fibrillation.  This was picked up incidentally at pulmonary rehab.  She was completely asymptomatic and could not tell that her heart rate was fast or irregular.  She has been seen by A. fib clinic.  Echocardiogram shows normal left ventricular systolic function.  She has mild diastolic dysfunction.  She was started on metoprolol XL.  Given the fact that she is wheezing quite a bit, we will discontinue the Toprol and start her on diltiazem CD 120 mg a day.  She was actually wheezing quite a bit before she was started on metoprolol but I want to eliminate any medications that might be worsening her COPD.  She is on Eliquis 5 mg a  day for reduction of stroke risk.  4. COPD :   She is been having lots more trouble with her COPD.  I have encouraged her to continue with her albuterol nebulizers.   Current medicines are reviewed at length with the patient today.  The patient does not have concerns regarding medicines.  The following changes have been made:  no change  Labs/ tests ordered today include:  No orders of the defined types were placed in this encounter.    Disposition:    2 months     Ariel Moores, MD  04/14/2019 5:47 PM    [facility] [address], [place], [place]  [address] Phone: [phone]; Fax: [phone]

## 2019-04-14 NOTE — Patient Instructions (Addendum)
Medication Instructions:  Your physician has recommended you make the following change in your medication:  STOP Metoprolol succinate (Toprol) START Diltiazem CD 120 mg once daily  If you need a refill on your cardiac medications before your next appointment, please call your pharmacy.    Lab work: None Ordered   Testing/Procedures: None Ordered   Follow-Up: At [facility], you and your health needs are our priority.  As part of our continuing mission to provide you with exceptional heart care, we have created designated Provider Care Teams.  These Care Teams include your primary Cardiologist (physician) and Advanced Practice Providers (APPs -  Physician Assistants and Nurse Practitioners) who all work together to provide you with the care you need, when you need it. You have a follow up appointment in:  2 months with Dr. Acie Fredrickson on Thursday Dec. 3 at 9:20 am In the future you may see one of the following Advanced Practice Providers on your designated Care Team: Richardson Dopp, PA-C [place], [place] . Daune Perch, NP

## 2019-04-25 NOTE — Addendum Note (Signed)
Encounter addended by: Lance Morin, RN on: 04/25/2019 4:45 PM  Actions taken: Clinical Note Signed, Episode resolved

## 2019-04-25 NOTE — Progress Notes (Signed)
Discharge Progress Report  Patient Details  Name: Ariel Gonzalez MRN: [id number] Date of Birth: [birth year] Referring Provider:     Pulmonary Rehab Walk Test from 02/02/2019 in [facility]  Referring Provider  Dr. Lamonte Sakai       Number of Visits: 13  Reason for Discharge:  Early Exit:  discharged early due to COPD exacerbation  Smoking History:  Social History   Tobacco Use  Smoking Status Former Smoker  . Packs/day: 1.00  . Years: 50.00  . Pack years: 50.00  . Types: Cigarettes  . Quit date: 04/01/2008  . Years since quitting: 11.0  Smokeless Tobacco Never Used  Tobacco Comment   Counseled to remain smoke free    Diagnosis:  Other emphysema ([place])  ADL [facility]: Pulmonary Assessment Scores    Row Name 02/02/19 1558 02/02/19 1559 02/02/19 1603     ADL [facility]   ADL Phase  -  -  Entry   SOB Score total  -  37  37     CAT Score   CAT Score  -  12  12     mMRC Score   mMRC Score  2  -  -      Initial Exercise Prescription: Initial Exercise Prescription - 02/02/19 1600      Date of Initial Exercise RX and Referring Provider   Date  02/02/19    Referring Provider  Dr. Lamonte Sakai      Oxygen   Oxygen  Continuous    Liters  3      Recumbant Bike   Level  1    Minutes  15      NuStep   Level  2    SPM  80    Minutes  15      Prescription Details   Frequency (times per week)  2    Duration  Progress to 30 minutes of continuous aerobic without signs/symptoms of physical distress      Intensity   THRR 40-80% of Max Heartrate  57-114    Ratings of Perceived Exertion  11-13    Perceived Dyspnea  0-4      Progression   Progression  Continue to progress workloads to maintain intensity without signs/symptoms of physical distress.      Resistance Training   Training Prescription  Yes    Weight  orange bands    Reps  10-15       Discharge Exercise Prescription (Final Exercise Prescription Changes): Exercise Prescription Changes -  03/28/19 1202      Response to Exercise   Blood Pressure (Admit)  94/60    Blood Pressure (Exercise)  100/74    Blood Pressure (Exit)  100/60    Heart Rate (Admit)  60 bpm    Heart Rate (Exercise)  76 bpm    Heart Rate (Exit)  62 bpm    Oxygen Saturation (Admit)  97 %    Oxygen Saturation (Exercise)  96 %    Oxygen Saturation (Exit)  97 %    Rating of Perceived Exertion (Exercise)  13    Perceived Dyspnea (Exercise)  3    Duration  Continue with 30 min of aerobic exercise without signs/symptoms of physical distress.    Intensity  THRR unchanged      Progression   Progression  Continue to progress workloads to maintain intensity without signs/symptoms of physical distress.      Resistance Training   Training Prescription  Yes  Weight  orange bands    Reps  10-15    Time  10 Minutes      Oxygen   Oxygen  Continuous    Liters  3      NuStep   Level  2   metoprolol started for new onset a. fib, very fatigued   SPM  80    Minutes  15    METs  1.8      Arm Ergometer   Level  --   not able to complete today      Functional Capacity: 6 Minute Walk    Row Name 02/02/19 1559         6 Minute Walk   Phase  Initial     Distance  1106 feet     Walk Time  6 minutes     # of Rest Breaks  0     MPH  2.09     METS  2.61     RPE  12     Perceived Dyspnea   2     VO2 Peak  7.64     Symptoms  Yes (comment)     Comments  back pain 3/10     Resting HR  74 bpm     Resting BP  118/74     Resting Oxygen Saturation   98 %     Exercise Oxygen Saturation  during 6 min walk  91 %     Max Ex. HR  115 bpm     Max Ex. BP  128/78     2 Minute Post BP  104/70       Interval HR   1 Minute HR  93     2 Minute HR  97     3 Minute HR  107     4 Minute HR  111     5 Minute HR  115     6 Minute HR  115     2 Minute Post HR  89     Interval Heart Rate?  Yes       Interval Oxygen   Interval Oxygen?  Yes     Baseline Oxygen Saturation %  98 %     1 Minute Oxygen Saturation %   97 %     1 Minute Liters of Oxygen  3 L     2 Minute Oxygen Saturation %  95 %     2 Minute Liters of Oxygen  3 L     3 Minute Oxygen Saturation %  93 %     3 Minute Liters of Oxygen  3 L     4 Minute Oxygen Saturation %  91 %     4 Minute Liters of Oxygen  3 L     5 Minute Oxygen Saturation %  91 %     5 Minute Liters of Oxygen  3 L     6 Minute Oxygen Saturation %  92 %     6 Minute Liters of Oxygen  3 L     2 Minute Post Oxygen Saturation %  97 %     2 Minute Post Liters of Oxygen  3 L        Psychological, QOL, Others - Outcomes: PHQ 2/9: Depression screen [facility] 2/9 02/02/2019 02/02/2019  Decreased Interest 0 0  Down, Depressed, Hopeless 0 0  PHQ - 2 Score 0 0  Altered sleeping 0 -  Tired, decreased  energy 0 -  Change in appetite 0 -  Feeling bad or failure about yourself  0 -  Trouble concentrating 0 -  Moving slowly or fidgety/restless 0 -  Suicidal thoughts 0 -  PHQ-9 Score 0 -  Difficult doing work/chores Not difficult at all -    Quality of Life:   Personal Goals: Goals established at orientation with interventions provided to work toward goal. Personal Goals and Risk Factors at Admission - 02/02/19 1336      Core Components/Risk Factors/Patient Goals on Admission    Weight Management  Weight Loss    Improve shortness of breath with ADL's  Yes    Intervention  Provide education, individualized exercise plan and daily activity instruction to help decrease symptoms of SOB with activities of daily living.    Expected Outcomes  Short Term: Improve cardiorespiratory fitness to achieve a reduction of symptoms when performing ADLs;Long Term: Be able to perform more ADLs without symptoms or delay the onset of symptoms        Personal Goals Discharge: Goals and Risk Factor Review    Row Name 02/02/19 1604 03/06/19 1023 04/03/19 1333         Core Components/Risk Factors/Patient Goals Review   Personal Goals Review  Weight Management/Obesity;Increase knowledge of  respiratory medications and ability to use respiratory devices properly.;Improve shortness of breath with ADL's;Develop more efficient breathing techniques such as purse lipped breathing and diaphragmatic breathing and practicing self-pacing with activity.  Weight Management/Obesity;Improve shortness of breath with ADL's;Develop more efficient breathing techniques such as purse lipped breathing and diaphragmatic breathing and practicing self-pacing with activity.;Increase knowledge of respiratory medications and ability to use respiratory devices properly.  Increase knowledge of respiratory medications and ability to use respiratory devices properly.;Improve shortness of breath with ADL's;Develop more efficient breathing techniques such as purse lipped breathing and diaphragmatic breathing and practicing self-pacing with activity.     Review  -  Maintaing weight, extremely short of breath with any activity.  She has never exercised prior to coming to pulmonary rehab.  She is very slow to progress d/t her emphysema and deconditioning.  She is on level 3 of the nustep and level 1.5 of the arm ergomenter  Her exercise progression has been slow due to her emphysema disease process.  She developed atrial fib while in program and was placed on metoprolol 25 mg bid which she tolerated poorly and had to be reduced to 25 mg at night.  She will return to exercise tomorrow and we will see how she tolerates it.     Expected Outcomes  -  See admission goals.  See admission goals.        Exercise Goals and Review: Exercise Goals    Row Name 04/03/19 [id number]             Exercise Goals   Increase Physical Activity  Yes       Intervention  Provide advice, education, support and counseling about physical activity/exercise needs.;Develop an individualized exercise prescription for aerobic and resistive training based on initial evaluation findings, risk stratification, comorbidities and participant's personal goals.        Expected Outcomes  Short Term: Attend rehab on a regular basis to increase amount of physical activity.;Long Term: Add in home exercise to make exercise part of routine and to increase amount of physical activity.;Long Term: Exercising regularly at least 3-5 days a week.       Increase Strength and Stamina  Yes  Intervention  Provide advice, education, support and counseling about physical activity/exercise needs.;Develop an individualized exercise prescription for aerobic and resistive training based on initial evaluation findings, risk stratification, comorbidities and participant's personal goals.       Expected Outcomes  Short Term: Increase workloads from initial exercise prescription for resistance, speed, and METs.;Short Term: Perform resistance training exercises routinely during rehab and add in resistance training at home;Long Term: Improve cardiorespiratory fitness, muscular endurance and strength as measured by increased METs and functional capacity (6MWT)       Able to understand and use rate of perceived exertion (RPE) scale  Yes       Intervention  Provide education and explanation on how to use RPE scale       Expected Outcomes  Short Term: Able to use RPE daily in rehab to express subjective intensity level;Long Term:  Able to use RPE to guide intensity level when exercising independently       Able to understand and use Dyspnea scale  Yes       Intervention  Provide education and explanation on how to use Dyspnea scale       Expected Outcomes  Short Term: Able to use Dyspnea scale daily in rehab to express subjective sense of shortness of breath during exertion;Long Term: Able to use Dyspnea scale to guide intensity level when exercising independently       Knowledge and understanding of Target Heart Rate Range (THRR)  Yes       Expected Outcomes  Short Term: Able to state/look up THRR;Short Term: Able to use daily as guideline for intensity in rehab;Long Term: Able to use THRR  to govern intensity when exercising independently       Understanding of Exercise Prescription  Yes       Intervention  Provide education, explanation, and written materials on patient's individual exercise prescription       Expected Outcomes  Short Term: Able to explain program exercise prescription;Long Term: Able to explain home exercise prescription to exercise independently          Exercise Goals Re-Evaluation: Exercise Goals Re-Evaluation    Row Name 03/07/19 0706 04/03/19 0922           Exercise Goal Re-Evaluation   Exercise Goals Review  Increase Physical Activity;Increase Strength and Stamina;Able to understand and use rate of perceived exertion (RPE) scale;Able to understand and use Dyspnea scale;Knowledge and understanding of Target Heart Rate Range (THRR);Understanding of Exercise Prescription  Increase Physical Activity;Increase Strength and Stamina;Able to understand and use rate of perceived exertion (RPE) scale;Able to understand and use Dyspnea scale;Knowledge and understanding of Target Heart Rate Range (THRR);Understanding of Exercise Prescription      Comments  Pt has completed 7 exercise sessions. Pt had not exercised since high school upon joining the program and was deconditioned. Progress has been slow, but pt has been able to increase METs on the stepper from 1.8 to 2.3. Pt has a positive attitude and is accepting of workload increases. Will continue to monitor and progress as able.  Pt has completed 13 exercise sessions. Pt was making good progress then she had new onset Afib which was caught during a rehab session. Pt has since been put on meds which have decreased her energy. Her meds have been reduced. Hopefully she will be back to normal this week, as she graduates this week. She was exercising at 2.2 METs before her most recent problems. Will continue to monitor and progress as able.  Expected Outcomes  Through exercise at rehab and at home, the patient will  decrease shortness of breath with daily activities and feel confident in carrying out an exercise regime at home.  Through exercise at rehab and at home, the patient will decrease shortness of breath with daily activities and feel confident in carrying out an exercise regime at home.         Nutrition & Weight - Outcomes: Pre Biometrics - 02/02/19 1558      Pre Biometrics   Grip Strength  33 kg        Nutrition:   Nutrition Discharge:   Education Questionnaire Score: Knowledge Questionnaire Score - 02/02/19 1604      Knowledge Questionnaire Score   Pre Score  15/18       Goals reviewed with patient; copy given to patient.

## 2019-04-26 ENCOUNTER — Encounter (HOSPITAL_COMMUNITY): Payer: Self-pay | Admitting: Internal Medicine

## 2019-04-26 ENCOUNTER — Other Ambulatory Visit: Payer: Self-pay

## 2019-04-26 ENCOUNTER — Inpatient Hospital Stay (HOSPITAL_COMMUNITY)
Admission: EM | Admit: 2019-04-26 | Discharge: 2019-05-03 | DRG: 190 | Disposition: A | Discharge status: Home / Self Care | Payer: Medicare Other | Attending: Internal Medicine | Admitting: Internal Medicine

## 2019-04-26 ENCOUNTER — Telehealth: Payer: Self-pay | Admitting: Adult Health

## 2019-04-26 ENCOUNTER — Emergency Department (HOSPITAL_COMMUNITY): Payer: Medicare Other

## 2019-04-26 DIAGNOSIS — R05 Cough: Secondary | ICD-10-CM | POA: Diagnosis not present

## 2019-04-26 DIAGNOSIS — E46 Unspecified protein-calorie malnutrition: Secondary | ICD-10-CM | POA: Diagnosis not present

## 2019-04-26 DIAGNOSIS — R0689 Other abnormalities of breathing: Secondary | ICD-10-CM | POA: Diagnosis not present

## 2019-04-26 DIAGNOSIS — I959 Hypotension, unspecified: Secondary | ICD-10-CM | POA: Diagnosis not present

## 2019-04-26 DIAGNOSIS — I4891 Unspecified atrial fibrillation: Secondary | ICD-10-CM | POA: Diagnosis not present

## 2019-04-26 DIAGNOSIS — Z87891 Personal history of nicotine dependence: Secondary | ICD-10-CM

## 2019-04-26 DIAGNOSIS — M8580 Other specified disorders of bone density and structure, unspecified site: Secondary | ICD-10-CM | POA: Diagnosis present

## 2019-04-26 DIAGNOSIS — Z9981 Dependence on supplemental oxygen: Secondary | ICD-10-CM

## 2019-04-26 DIAGNOSIS — J441 Chronic obstructive pulmonary disease with (acute) exacerbation: Secondary | ICD-10-CM | POA: Diagnosis not present

## 2019-04-26 DIAGNOSIS — N3281 Overactive bladder: Secondary | ICD-10-CM | POA: Diagnosis present

## 2019-04-26 DIAGNOSIS — Z20828 Contact with and (suspected) exposure to other viral communicable diseases: Secondary | ICD-10-CM | POA: Diagnosis not present

## 2019-04-26 DIAGNOSIS — Z881 Allergy status to other antibiotic agents status: Secondary | ICD-10-CM

## 2019-04-26 DIAGNOSIS — D72829 Elevated white blood cell count, unspecified: Secondary | ICD-10-CM | POA: Diagnosis not present

## 2019-04-26 DIAGNOSIS — I4892 Unspecified atrial flutter: Secondary | ICD-10-CM | POA: Diagnosis not present

## 2019-04-26 DIAGNOSIS — I471 Supraventricular tachycardia: Secondary | ICD-10-CM | POA: Diagnosis not present

## 2019-04-26 DIAGNOSIS — E785 Hyperlipidemia, unspecified: Secondary | ICD-10-CM | POA: Diagnosis present

## 2019-04-26 DIAGNOSIS — Z79899 Other long term (current) drug therapy: Secondary | ICD-10-CM

## 2019-04-26 DIAGNOSIS — Z85828 Personal history of other malignant neoplasm of skin: Secondary | ICD-10-CM

## 2019-04-26 DIAGNOSIS — Z9049 Acquired absence of other specified parts of digestive tract: Secondary | ICD-10-CM

## 2019-04-26 DIAGNOSIS — Z7901 Long term (current) use of anticoagulants: Secondary | ICD-10-CM

## 2019-04-26 DIAGNOSIS — I10 Essential (primary) hypertension: Secondary | ICD-10-CM | POA: Diagnosis present

## 2019-04-26 DIAGNOSIS — J9811 Atelectasis: Secondary | ICD-10-CM | POA: Diagnosis present

## 2019-04-26 DIAGNOSIS — Y92230 Patient room in hospital as the place of occurrence of the external cause: Secondary | ICD-10-CM | POA: Diagnosis not present

## 2019-04-26 DIAGNOSIS — Z7952 Long term (current) use of systemic steroids: Secondary | ICD-10-CM

## 2019-04-26 DIAGNOSIS — Z90710 Acquired absence of both cervix and uterus: Secondary | ICD-10-CM

## 2019-04-26 DIAGNOSIS — J9621 Acute and chronic respiratory failure with hypoxia: Secondary | ICD-10-CM | POA: Diagnosis present

## 2019-04-26 DIAGNOSIS — Z8249 Family history of ischemic heart disease and other diseases of the circulatory system: Secondary | ICD-10-CM

## 2019-04-26 DIAGNOSIS — H269 Unspecified cataract: Secondary | ICD-10-CM | POA: Diagnosis present

## 2019-04-26 DIAGNOSIS — Z803 Family history of malignant neoplasm of breast: Secondary | ICD-10-CM

## 2019-04-26 DIAGNOSIS — I2510 Atherosclerotic heart disease of native coronary artery without angina pectoris: Secondary | ICD-10-CM | POA: Diagnosis present

## 2019-04-26 DIAGNOSIS — R062 Wheezing: Secondary | ICD-10-CM | POA: Diagnosis not present

## 2019-04-26 DIAGNOSIS — F410 Panic disorder [episodic paroxysmal anxiety] without agoraphobia: Secondary | ICD-10-CM | POA: Diagnosis present

## 2019-04-26 DIAGNOSIS — J80 Acute respiratory distress syndrome: Secondary | ICD-10-CM | POA: Diagnosis not present

## 2019-04-26 DIAGNOSIS — L8990 Pressure ulcer of unspecified site, unspecified stage: Secondary | ICD-10-CM | POA: Diagnosis present

## 2019-04-26 DIAGNOSIS — T380X5A Adverse effect of glucocorticoids and synthetic analogues, initial encounter: Secondary | ICD-10-CM | POA: Diagnosis not present

## 2019-04-26 DIAGNOSIS — Z91048 Other nonmedicinal substance allergy status: Secondary | ICD-10-CM

## 2019-04-26 DIAGNOSIS — I480 Paroxysmal atrial fibrillation: Secondary | ICD-10-CM | POA: Diagnosis present

## 2019-04-26 DIAGNOSIS — I2584 Coronary atherosclerosis due to calcified coronary lesion: Secondary | ICD-10-CM | POA: Diagnosis present

## 2019-04-26 DIAGNOSIS — Z7951 Long term (current) use of inhaled steroids: Secondary | ICD-10-CM

## 2019-04-26 LAB — COMPREHENSIVE METABOLIC PANEL
ALT: 26 U/L (ref 0–44)
AST: 31 U/L (ref 15–41)
Albumin: 3.9 g/dL (ref 3.5–5.0)
Alkaline Phosphatase: 75 U/L (ref 38–126)
Anion gap: 11 (ref 5–15)
BUN: 21 mg/dL (ref 8–23)
CO2: 29 mmol/L (ref 22–32)
Calcium: 9 mg/dL (ref 8.9–10.3)
Chloride: 102 mmol/L (ref 98–111)
Creatinine, Ser: 0.84 mg/dL (ref 0.44–1.00)
GFR calc Af Amer: >60 mL/min (ref >60)
GFR calc non Af Amer: >60 mL/min (ref >60)
Glucose, Bld: 155 mg/dL — ABNORMAL HIGH (ref 70–99)
Potassium: 4.6 mmol/L (ref 3.5–5.1)
Sodium: 142 mmol/L (ref 135–145)
Total Bilirubin: 1.3 mg/dL — ABNORMAL HIGH (ref 0.3–1.2)
Total Protein: 6.7 g/dL (ref 6.5–8.1)

## 2019-04-26 LAB — CBC WITH DIFFERENTIAL/PLATELET
Abs Immature Granulocytes: 0.05 10*3/uL (ref 0.00–0.07)
Basophils Absolute: 0.1 10*3/uL (ref 0.0–0.1)
Basophils Relative: 1 %
Eosinophils Absolute: 0.2 10*3/uL (ref 0.0–0.5)
Eosinophils Relative: 2 %
HCT: 42.2 % (ref 36.0–46.0)
Hemoglobin: 13.5 g/dL (ref 12.0–15.0)
Immature Granulocytes: 0 %
Lymphocytes Relative: 6 %
Lymphs Abs: 0.7 10*3/uL (ref 0.7–4.0)
MCH: 32.1 pg (ref 26.0–34.0)
MCHC: 32 g/dL (ref 30.0–36.0)
MCV: 100.2 fL — ABNORMAL HIGH (ref 80.0–100.0)
Monocytes Absolute: 0.3 10*3/uL (ref 0.1–1.0)
Monocytes Relative: 3 %
Neutro Abs: 11 10*3/uL — ABNORMAL HIGH (ref 1.7–7.7)
Neutrophils Relative %: 88 %
Platelets: 210 10*3/uL (ref 150–400)
RBC: 4.21 MIL/uL (ref 3.87–5.11)
RDW: 13 % (ref 11.5–15.5)
WBC: 12.4 10*3/uL — ABNORMAL HIGH (ref 4.0–10.5)
nRBC: 0 % (ref 0.0–0.2)

## 2019-04-26 LAB — BRAIN NATRIURETIC PEPTIDE: B Natriuretic Peptide: 145.9 pg/mL — ABNORMAL HIGH (ref 0.0–100.0)

## 2019-04-26 MED ORDER — DILTIAZEM LOAD VIA INFUSION: 10.0000 mg | Freq: Once | INTRAVENOUS | Status: DC

## 2019-04-26 MED ORDER — LEVALBUTEROL HCL 0.63 MG/3ML IN NEBU: 0.6300 mg | INHALATION_SOLUTION | Freq: Four times a day (QID) | RESPIRATORY_TRACT | Status: DC | PRN

## 2019-04-26 MED ORDER — MAGNESIUM SULFATE 2 GM/50ML IV SOLN
2.0000 g | Freq: Once | INTRAVENOUS | Status: AC
  Administered 2019-04-26: 20:00:00 2 g via INTRAVENOUS
  Filled 2019-04-26: qty 50

## 2019-04-26 MED ORDER — CITALOPRAM HYDROBROMIDE 10 MG PO TABS
40.0000 mg | ORAL_TABLET | Freq: Every day | ORAL | Status: DC
  Administered 2019-04-27 – 2019-05-03 (×7): 40 mg via ORAL
  Filled 2019-04-26 (×7): qty 4

## 2019-04-26 MED ORDER — ALBUTEROL SULFATE HFA 108 (90 BASE) MCG/ACT IN AERS
4.0000 | INHALATION_SPRAY | Freq: Once | RESPIRATORY_TRACT | Status: AC
  Administered 2019-04-26: 21:00:00 4 via RESPIRATORY_TRACT

## 2019-04-26 MED ORDER — BUPROPION HCL ER (XL) 150 MG PO TB24
150.0000 mg | ORAL_TABLET | Freq: Every day | ORAL | Status: DC
  Administered 2019-04-27 – 2019-05-03 (×7): 150 mg via ORAL
  Filled 2019-04-26 (×7): qty 1

## 2019-04-26 MED ORDER — DILTIAZEM HCL-DEXTROSE 125-5 MG/125ML-% IV SOLN (PREMIX)
5.0000 mg/h | INTRAVENOUS | Status: DC
  Administered 2019-04-26: 5 mg/h via INTRAVENOUS
  Filled 2019-04-26: qty 125

## 2019-04-26 MED ORDER — MAGNESIUM SULFATE 50 % IJ SOLN: 2.0000 g | Freq: Once | INTRAMUSCULAR | Status: DC

## 2019-04-26 MED ORDER — AMLODIPINE BESYLATE 5 MG PO TABS
5.0000 mg | ORAL_TABLET | Freq: Every day | ORAL | Status: DC
  Administered 2019-04-27: 10:00:00 5 mg via ORAL
  Filled 2019-04-26: qty 1

## 2019-04-26 MED ORDER — ALBUTEROL SULFATE HFA 108 (90 BASE) MCG/ACT IN AERS
8.0000 | INHALATION_SPRAY | Freq: Once | RESPIRATORY_TRACT | Status: AC
  Administered 2019-04-26: 8 via RESPIRATORY_TRACT
  Filled 2019-04-26: qty 6.7

## 2019-04-26 MED ORDER — IPRATROPIUM BROMIDE 0.02 % IN SOLN: 0.5000 mg | RESPIRATORY_TRACT | Status: DC

## 2019-04-26 MED ORDER — ATORVASTATIN CALCIUM 40 MG PO TABS
40.0000 mg | ORAL_TABLET | Freq: Every day | ORAL | Status: DC
  Administered 2019-04-27 – 2019-05-02 (×6): 40 mg via ORAL
  Filled 2019-04-26 (×6): qty 1

## 2019-04-26 MED ORDER — DILTIAZEM HCL 25 MG/5ML IV SOLN
10.0000 mg | Freq: Once | INTRAVENOUS | Status: AC
  Administered 2019-04-26: 23:00:00 10 mg via INTRAVENOUS
  Filled 2019-04-26 (×2): qty 5

## 2019-04-26 MED ORDER — BUDESONIDE 0.25 MG/2ML IN SUSP
0.2500 mg | Freq: Two times a day (BID) | RESPIRATORY_TRACT | Status: DC
  Administered 2019-04-27 – 2019-05-03 (×13): 0.25 mg via RESPIRATORY_TRACT
  Filled 2019-04-26 (×13): qty 2

## 2019-04-26 MED ORDER — MONTELUKAST SODIUM 10 MG PO TABS
10.0000 mg | ORAL_TABLET | Freq: Every day | ORAL | Status: DC
  Administered 2019-04-27 – 2019-05-02 (×7): 10 mg via ORAL
  Filled 2019-04-26 (×7): qty 1

## 2019-04-26 MED ORDER — METHYLPREDNISOLONE SODIUM SUCC 40 MG IJ SOLR
40.0000 mg | Freq: Two times a day (BID) | INTRAMUSCULAR | Status: DC
  Administered 2019-04-27 – 2019-04-30 (×7): 40 mg via INTRAVENOUS
  Filled 2019-04-26 (×7): qty 1

## 2019-04-26 MED ORDER — CLONAZEPAM 1 MG PO TABS
1.0000 mg | ORAL_TABLET | Freq: Two times a day (BID) | ORAL | Status: DC
  Administered 2019-04-27 – 2019-05-03 (×14): 1 mg via ORAL
  Filled 2019-04-26 (×14): qty 1

## 2019-04-26 MED ORDER — APIXABAN 5 MG PO TABS
5.0000 mg | ORAL_TABLET | Freq: Two times a day (BID) | ORAL | Status: DC
  Administered 2019-04-27 – 2019-05-03 (×14): 5 mg via ORAL
  Filled 2019-04-26 (×14): qty 1

## 2019-04-26 MED ORDER — ACETAMINOPHEN 650 MG RE SUPP: 650.0000 mg | Freq: Four times a day (QID) | RECTAL | Status: DC | PRN

## 2019-04-26 MED ORDER — ONDANSETRON HCL 4 MG/2ML IJ SOLN: 4.0000 mg | Freq: Four times a day (QID) | INTRAMUSCULAR | Status: DC | PRN

## 2019-04-26 MED ORDER — LEVALBUTEROL HCL 0.63 MG/3ML IN NEBU
0.6300 mg | INHALATION_SOLUTION | Freq: Four times a day (QID) | RESPIRATORY_TRACT | Status: DC
  Administered 2019-04-27: 08:00:00 0.63 mg via RESPIRATORY_TRACT
  Filled 2019-04-26: qty 3

## 2019-04-26 MED ORDER — ALPRAZOLAM 0.5 MG PO TABS
0.5000 mg | ORAL_TABLET | Freq: Three times a day (TID) | ORAL | Status: DC | PRN
  Administered 2019-04-27 – 2019-05-02 (×9): 0.5 mg via ORAL
  Filled 2019-04-26 (×9): qty 1

## 2019-04-26 MED ORDER — DILTIAZEM HCL ER COATED BEADS 120 MG PO CP24
120.0000 mg | ORAL_CAPSULE | Freq: Every day | ORAL | Status: DC
  Filled 2019-04-26: qty 1

## 2019-04-26 MED ORDER — ALBUTEROL SULFATE (2.5 MG/3ML) 0.083% IN NEBU: 2.5000 mg | INHALATION_SOLUTION | RESPIRATORY_TRACT | Status: DC

## 2019-04-26 MED ORDER — ALBUTEROL SULFATE (2.5 MG/3ML) 0.083% IN NEBU: 2.5000 mg | INHALATION_SOLUTION | RESPIRATORY_TRACT | Status: DC | PRN

## 2019-04-26 MED ORDER — CALCIUM CARBONATE 1250 (500 CA) MG PO TABS
1250.0000 mg | ORAL_TABLET | Freq: Every day | ORAL | Status: DC
  Administered 2019-04-27 – 2019-05-03 (×7): 1250 mg via ORAL
  Filled 2019-04-26 (×7): qty 1

## 2019-04-26 MED ORDER — ACETAMINOPHEN 325 MG PO TABS: 650.0000 mg | ORAL_TABLET | Freq: Four times a day (QID) | ORAL | Status: DC | PRN

## 2019-04-26 MED ORDER — ONDANSETRON HCL 4 MG PO TABS: 4.0000 mg | ORAL_TABLET | Freq: Four times a day (QID) | ORAL | Status: DC | PRN

## 2019-04-26 MED ORDER — ADULT MULTIVITAMIN W/MINERALS CH
1.0000 | ORAL_TABLET | Freq: Every day | ORAL | Status: DC
  Administered 2019-04-27 – 2019-05-03 (×7): 1 via ORAL
  Filled 2019-04-26 (×7): qty 1

## 2019-04-26 NOTE — Telephone Encounter (Signed)
If she is really out of breath despite using her nebs and her oxygen she needs to seek emergency care at the ED.

## 2019-04-26 NOTE — ED Triage Notes (Addendum)
Per Ems, patient has been in respiratory distress all day. Patient has done 3 nebs. History of stage 4 COPD. Patient is on 4L [place] chronically. Per EMS o2 sat was 94% on 4L upon arrival. Patient denies. EMS gave 0.3 epi and 125 solu medrol in route. Patient recently finished round of doxycycline and prednisone for URI.

## 2019-04-26 NOTE — Telephone Encounter (Signed)
Spoke with patient.  She was still wheezing and I mentioned to her again to go to the emergency room as this is also the recommendations from our NP.  Patient states she really doesn't want to start with a new provider. I explained to her if she is having these spells 5-6x a day then she definitely needs to head to the ED , patient still sounds very wheezy, unable to get through a short sentence without wheezing or getting out of breath, even after her 3rd treatment from her nebulizer.  Patient agreed to call EMS and go to emergency room  Nothing further needed at this time.

## 2019-04-26 NOTE — Telephone Encounter (Signed)
Primary Pulmonologist: Byrum Last office visit and with whom: 03/08/19 What do we see them for (pulmonary problems): COPD Last OV assessment/plan:  "Instructions  Please stop Symbicort and Spiriva for now. We will try starting Stiolto 2 puffs once daily to see if you get more benefit.  Please call when the samples run out to let us know if you have benefited.  If so then we will call this medication to your pharmacy. Keep your albuterol available to use either 1 nebulizer treatment or 2 puffs up to every 4 hours if needed for shortness of breath, chest tightness, wheezing. Continue your oxygen at up to 3.5 L/min when you are exerting herself. Continue pulmonary rehabilitation Your pneumonia shots are up-to-date Flu shot today. Follow with Dr Lamonte Sakai in 6 months or sooner if you have any problems       Was appointment offered to patient (explain)?  Has one 04/27/19   Reason for call: patient wheezing really badly, has used her nebulizer 2 times today one 3rd treatment while on phone. Patient wears 4L of Oxygen sats 94% but patient states she is really out of breath   TP please advise

## 2019-04-26 NOTE — H&P (Signed)
History and Physical    Ariel Gonzalez [id number] DOB: 07/21/1940 DOA: 04/26/2019  PCP: Gaynelle Arabian, MD  Patient coming from: Home.  Chief Complaint: Shortness of breath.  HPI: Ariel Gonzalez is a 78 y.o. female with history of COPD on 4 L home oxygen, paroxysmal atrial fibrillation recently diagnosed and was placed on Cardizem and Eliquis, hypertension who has been recently treated for COPD exacerbation with prednisone and doxycycline was brought to the ER the patient was having shortness of breath with wheezing and has had to use multiple dose of nebulizer despite which patient was still short of breath.  Denies any chest pain fever chills or productive cough.  At times gets diaphoretic.  Due to the symptoms patient was brought to the ER.  On the way patient was given epinephrine and Solu-Medrol.  ED Course: In the ER chest x-ray was unremarkable.  Patient was found to be wheezing are placed on nebulizer treatment.  And while in the ER patient went to A. fib with RVR and was given 10 mg IV Cardizem bolus following which patient was started on Cardizem infusion.  Patient admitted for further management of COPD exacerbation and A. fib with RVR.  Labs show creatinine 1.8 blood glucose 155 WBC 12.4 BNP 145.9 and COVID-19 test was negative.  Review of Systems: As per HPI, rest all negative.   Past Medical History:  Diagnosis Date  . Allergic rhinitis   . Arthritis    CERVICAL AND LUMBER DDD  . COPD (chronic obstructive pulmonary disease) ([place])    HFA 75% p coaching July 17, 2010  . HTN (hypertension)   . Migraine   . Osteopenia   . Overactive bladder   . Panic attacks   . Squamous cell carcinoma of skin     Past Surgical History:  Procedure Laterality Date  . [place]  2010  . CATARACTS Bilateral 2009  . CHOLECYSTECTOMY    . JAW BONE GRAFT  2009  . TOTAL ABDOMINAL HYSTERECTOMY W/ BILATERAL SALPINGOOPHORECTOMY       reports that she quit smoking about 11 years  ago. Her smoking use included cigarettes. She has a 50.00 pack-year smoking history. She has never used smokeless tobacco. She reports that she does not drink alcohol or use drugs.  Allergies  Allergen Reactions  . Cefaclor     rash  . Clarithromycin     rash  . Erythromycin     rash  . Iodine     rash    Family History  Problem Relation Age of Onset  . Cancer Mother   . Breast cancer Mother   . Heart attack Father   . Coronary artery disease Father   . Cancer Sister   . Breast cancer Sister     Prior to Admission medications   Medication Sig Start Date End Date Taking? Authorizing Provider  albuterol (PROVENTIL) (2.5 MG/3ML) 0.083% nebulizer solution Take 3 mLs (2.5 mg total) by nebulization every 6 (six) hours as needed for wheezing or shortness of breath. 03/14/19  Yes Collene Gobble, MD  ALPRAZolam Duanne Moron) 0.5 MG tablet Take 0.5 mg by mouth 3 (three) times daily as needed for anxiety or sleep. 1 every 6 hours as needed   Yes [provider]  amLODipine (NORVASC) 5 MG tablet Take 5 mg by mouth daily.   Yes [provider]  apixaban (ELIQUIS) 5 MG TABS tablet Take 1 tablet (5 mg total) by mouth 2 (two) times daily. 03/21/19 04/26/19  Yes Madilyn Hook A, PA-C  atorvastatin (LIPITOR) 40 MG tablet TAKE 1 TABLET BY MOUTH ONCE DAILY Patient taking differently: Take 40 mg by mouth daily.  01/18/17  Yes Nahser, Wonda Cheng, MD  buPROPion (WELLBUTRIN XL) 150 MG 24 hr tablet Take 1 tablet by mouth daily. 03/16/19  Yes [provider]  calcium carbonate (OS-CAL) 600 MG TABS Take 600 mg by mouth daily.     Yes [provider]  Cholecalciferol (PA VITAMIN D-3) 2000 UNITS CAPS Take 2,000 Units by mouth daily.    Yes [provider]  citalopram (CELEXA) 20 MG tablet Take 40 mg by mouth daily.  09/06/14  Yes [provider]  clonazePAM (KLONOPIN) 1 MG tablet Half tablet to whole tablet twice a day for anxiety 08/03/16  Yes [provider]   diltiazem (CARDIZEM CD) 120 MG 24 hr capsule Take 1 capsule (120 mg total) by mouth daily. 04/14/19  Yes Nahser, Wonda Cheng, MD  montelukast (SINGULAIR) 10 MG tablet Take 1 tablet (10 mg total) by mouth at bedtime. 09/14/16  Yes Javier Glazier, MD  Multiple Vitamin (MULTIVITAMIN) tablet Take 1 tablet by mouth daily.     Yes [provider]  Tiotropium Bromide-Olodaterol (STIOLTO RESPIMAT) 2.5-2.5 MCG/ACT AERS Inhale 2 puffs into the lungs daily. 03/08/19  Yes Byrum, Rose Fillers, MD  VENTOLIN HFA 108 (90 Base) MCG/ACT inhaler INHALE 2 PUFFS EVERY 4 HOURS AS NEEDED Patient taking differently: Inhale 2 puffs into the lungs every 4 (four) hours as needed for wheezing or shortness of breath.  02/04/16  Yes Byrum, Rose Fillers, MD  doxycycline (VIBRA-TABS) 100 MG tablet Take 1 tablet (100 mg total) by mouth 2 (two) times daily. Patient not taking: Reported on 04/26/2019 04/06/19   Collene Gobble, MD  OXYGEN Inhale 3.5 L into the lungs continuous.    [provider]  predniSONE (DELTASONE) 10 MG tablet 40 mg X 3 days, 30mg  X 3 days, 20mg  X 3 days, 10mg  X 3 days, then stop Patient not taking: Reported on 04/26/2019 04/06/19   Collene Gobble, MD    Physical Exam: Constitutional: Moderately built and nourished. Vitals:   04/26/19 1820 04/26/19 1823 04/26/19 1900 04/26/19 2030  BP:  122/82 (!) 143/67 116/88  Pulse:   94 91  Resp:  17 (!) 23 17  Temp:  99.2 F (37.3 C)    TempSrc:  Oral    SpO2: 98% 99% 94% 93%  Weight: 65.3 kg     Height: 5\' 6"  (1.676 m)      Eyes: Anicteric no pallor. ENMT: No discharge from the ears eyes nose or mouth. Neck: No mass felt.  No neck rigidity no JVD appreciated. Respiratory: Bilateral expiratory wheeze and no crepitations. Cardiovascular: S1-S2 heard. Abdomen: Soft nontender bowel sounds present. Musculoskeletal: No edema.  No joint effusion. Skin: No rash. Neurologic: Alert awake oriented to time place and person.  Moves all extremities.  Psychiatric: Appears normal.   Labs on Admission: I have personally reviewed following labs and imaging studies  CBC: Recent Labs  Lab 04/26/19 1939  WBC 12.4*  NEUTROABS 11.0*  HGB 13.5  HCT 42.2  MCV 100.2*  PLT [id number]   Basic Metabolic Panel: Recent Labs  Lab 04/26/19 1939  NA 142  K 4.6  CL 102  CO2 29  GLUCOSE 155*  BUN 21  CREATININE 0.84  CALCIUM 9.0   GFR: Estimated Creatinine Clearance: 51.7 mL/min (by C-G formula based on SCr of 0.84 mg/dL). Liver Function  Tests: Recent Labs  Lab 04/26/19 1939  AST 31  ALT 26  ALKPHOS 75  BILITOT 1.3*  PROT 6.7  ALBUMIN 3.9   No results for input(s): LIPASE, AMYLASE in the last 168 hours. No results for input(s): AMMONIA in the last 168 hours. Coagulation Profile: No results for input(s): INR, PROTIME in the last 168 hours. Cardiac Enzymes: No results for input(s): CKTOTAL, CKMB, CKMBINDEX, TROPONINI in the last 168 hours. BNP (last 3 results) No results for input(s): PROBNP in the last 8760 hours. HbA1C: No results for input(s): HGBA1C in the last 72 hours. CBG: No results for input(s): GLUCAP in the last 168 hours. Lipid Profile: No results for input(s): CHOL, HDL, LDLCALC, TRIG, CHOLHDL, LDLDIRECT in the last 72 hours. Thyroid Function Tests: No results for input(s): TSH, T4TOTAL, FREET4, T3FREE, THYROIDAB in the last 72 hours. Anemia Panel: No results for input(s): VITAMINB12, FOLATE, FERRITIN, TIBC, IRON, RETICCTPCT in the last 72 hours. Urine analysis: No results found for: COLORURINE, APPEARANCEUR, LABSPEC, PHURINE, GLUCOSEU, HGBUR, BILIRUBINUR, KETONESUR, PROTEINUR, UROBILINOGEN, NITRITE, LEUKOCYTESUR Sepsis Labs: @LABRCNTIP (procalcitonin:4,lacticidven:4) )No results found for this or any previous visit (from the past 240 hour(s)).   Radiological Exams on Admission: Dg Chest Portable 1 View  Result Date: 04/26/2019 CLINICAL DATA:  78 year old female with cough and dyspnea. EXAM: PORTABLE CHEST 1  VIEW COMPARISON:  Chest radiograph dated 03/21/2019 and CT dated 08/11/2018. FINDINGS: There is emphysematous changes of the lungs. No focal consolidation, pleural effusion, or pneumothorax. Stable cardiac silhouette. Atherosclerotic calcification of the aortic arch. No acute osseous pathology. IMPRESSION: 1. No acute cardiopulmonary process. 2. Emphysema. Electronically Signed   By: Anner Crete M.D.   On: 04/26/2019 19:49    EKG: Independently reviewed.  Tachycardia shows signs of SVT.  Assessment/Plan Principal Problem:   COPD exacerbation (HCC) Active Problems:   Coronary artery calcification   Hyperlipidemia   Paroxysmal atrial fibrillation ([place])    1. Acute respiratory failure with hypoxia secondary to COPD exacerbation for which patient is placed on Solu-Medrol and will change albuterol Atrovent to Xopenex due to A. fib with RVR and will place patient on Pulmicort and Zithromax. 2. A. fib with RVR -presently on Cardizem infusion.  On apixaban for anticoagulation.  Will check TSH troponin.  Patient is on Cardizem tablets and will try to wean off Cardizem infusion once patient heart rate improved and patient takes Cardizem tablet. 3. History of coronary artery calcifications. 4. History of hypertension on amlodipine.   DVT prophylaxis: Apixaban. Code Status: Full code. Family Communication: Patient's daughter. Disposition Plan: Home. Consults called: None. Admission status: Observation.   Rise Patience MD [facility] Pager [phone].  If 7PM-7AM, please contact night-coverage [email or web address] Password [email or web address]  04/26/2019, 9:12 PM

## 2019-04-26 NOTE — Telephone Encounter (Signed)
Primary Pulmonologist: Byrum Last office visit and with whom: 03/08/19 What do we see them for (pulmonary problems): COPD Last OV assessment/plan:  "Instructions  Please stop Symbicort and Spiriva for now. We will try starting Stiolto 2 puffs once daily to see if you get more benefit. Please call when the samples run out to let us know if you have benefited. If so then we will call this medication to your pharmacy. Keep your albuterol available to use either 1 nebulizer treatment or 2 puffs up to every 4 hours if needed for shortness of breath, chest tightness, wheezing. Continue your oxygen at up to 3.5 L/min when you are exerting herself. Continue pulmonary rehabilitation Your pneumonia shots are up-to-date Flu shot today. Follow with Dr Lamonte Sakai in 6 months or sooner if you have any problems       Was appointment offered to patient (explain)?  Has one 04/27/19 with TP   Reason for call: patient wheezing really badly, has used her nebulizer 2 times today one 3rd treatment while on phone. Patient wears 4L of Oxygen sats 94% but patient states she is really out of breath   SG please advise

## 2019-04-26 NOTE — ED Provider Notes (Signed)
[facility] DEPT Provider Note   CSN: [id number] Arrival date & time: 04/26/19  1812     History   Chief Complaint Chief Complaint  Patient presents with  . Respiratory Distress    HPI Ariel Gonzalez is a 78 y.o. female.     HPI  78 year old female presents with shortness of breath.  She states that for the past month she has had more prominent wheezing.  She has been using nebulizers multiple times per day.  At least once per day she will have a more significant episode that will feel like a panic attack.  This occurred today and she was worried that since she was alone she would do poorly.  Called 911.  Chronically is on 4 L of oxygen.  She is feeling somewhat better, unclear if this is from the Solu-Medrol and epinephrine given by EMS or from time.  Still feels short of breath but closer to her baseline.  Has a chronic cough that is unchanged.  No fevers or sick contacts.  No chest pain or leg swelling.  Past Medical History:  Diagnosis Date  . Allergic rhinitis   . Arthritis    CERVICAL AND LUMBER DDD  . COPD (chronic obstructive pulmonary disease) ([place])    HFA 75% p coaching July 17, 2010  . HTN (hypertension)   . Migraine   . Osteopenia   . Overactive bladder   . Panic attacks   . Squamous cell carcinoma of skin     Patient Active Problem List   Diagnosis Date Noted  . COPD exacerbation ([place]) 04/26/2019  . Paroxysmal atrial fibrillation ([place]) 04/14/2019  . Coronary artery calcification 10/02/2015  . Hyperlipidemia 10/02/2015  . Cough 06/13/2015  . Allergic rhinitis 10/05/2012  . UPPER RESPIRATORY INFECTION 08/28/2010  . Chronic respiratory failure with hypoxia ([place]) 07/17/2010  . COPD (chronic obstructive pulmonary disease) ([place]) 04/06/2007    Past Surgical History:  Procedure Laterality Date  . [place]  2010  . CATARACTS Bilateral 2009  . CHOLECYSTECTOMY    . JAW BONE GRAFT  2009  . TOTAL ABDOMINAL HYSTERECTOMY  W/ BILATERAL SALPINGOOPHORECTOMY       OB History   No obstetric history on file.      Home Medications    Prior to Admission medications   Medication Sig Start Date End Date Taking? Authorizing Provider  albuterol (PROVENTIL) (2.5 MG/3ML) 0.083% nebulizer solution Take 3 mLs (2.5 mg total) by nebulization every 6 (six) hours as needed for wheezing or shortness of breath. 03/14/19  Yes Collene Gobble, MD  ALPRAZolam Duanne Moron) 0.5 MG tablet Take 0.5 mg by mouth 3 (three) times daily as needed for anxiety or sleep. 1 every 6 hours as needed   Yes [provider]  amLODipine (NORVASC) 5 MG tablet Take 5 mg by mouth daily.   Yes [provider]  apixaban (ELIQUIS) 5 MG TABS tablet Take 1 tablet (5 mg total) by mouth 2 (two) times daily. 03/21/19 04/26/19 Yes Madilyn Hook A, PA-C  atorvastatin (LIPITOR) 40 MG tablet TAKE 1 TABLET BY MOUTH ONCE DAILY Patient taking differently: Take 40 mg by mouth daily.  01/18/17  Yes Nahser, Wonda Cheng, MD  buPROPion (WELLBUTRIN XL) 150 MG 24 hr tablet Take 1 tablet by mouth daily. 03/16/19  Yes [provider]  calcium carbonate (OS-CAL) 600 MG TABS Take 600 mg by mouth daily.     Yes [provider]  Cholecalciferol (PA VITAMIN D-3) 2000 UNITS CAPS  Take 2,000 Units by mouth daily.    Yes [provider]  citalopram (CELEXA) 20 MG tablet Take 40 mg by mouth daily.  09/06/14  Yes [provider]  clonazePAM (KLONOPIN) 1 MG tablet Half tablet to whole tablet twice a day for anxiety 08/03/16  Yes [provider]  diltiazem (CARDIZEM CD) 120 MG 24 hr capsule Take 1 capsule (120 mg total) by mouth daily. 04/14/19  Yes Nahser, Wonda Cheng, MD  montelukast (SINGULAIR) 10 MG tablet Take 1 tablet (10 mg total) by mouth at bedtime. 09/14/16  Yes Javier Glazier, MD  Multiple Vitamin (MULTIVITAMIN) tablet Take 1 tablet by mouth daily.     Yes [provider]  Tiotropium Bromide-Olodaterol (STIOLTO RESPIMAT)  2.5-2.5 MCG/ACT AERS Inhale 2 puffs into the lungs daily. 03/08/19  Yes Byrum, Rose Fillers, MD  VENTOLIN HFA 108 (90 Base) MCG/ACT inhaler INHALE 2 PUFFS EVERY 4 HOURS AS NEEDED Patient taking differently: Inhale 2 puffs into the lungs every 4 (four) hours as needed for wheezing or shortness of breath.  02/04/16  Yes Byrum, Rose Fillers, MD  doxycycline (VIBRA-TABS) 100 MG tablet Take 1 tablet (100 mg total) by mouth 2 (two) times daily. Patient not taking: Reported on 04/26/2019 04/06/19   Collene Gobble, MD  OXYGEN Inhale 3.5 L into the lungs continuous.    [provider]  predniSONE (DELTASONE) 10 MG tablet 40 mg X 3 days, 30mg  X 3 days, 20mg  X 3 days, 10mg  X 3 days, then stop Patient not taking: Reported on 04/26/2019 04/06/19   Collene Gobble, MD    Family History Family History  Problem Relation Age of Onset  . Cancer Mother   . Breast cancer Mother   . Heart attack Father   . Coronary artery disease Father   . Cancer Sister   . Breast cancer Sister     Social History Social History   Tobacco Use  . Smoking status: Former Smoker    Packs/day: 1.00    Years: 50.00    Pack years: 50.00    Types: Cigarettes    Quit date: 04/01/2008    Years since quitting: 11.0  . Smokeless tobacco: Never Used  . Tobacco comment: Counseled to remain smoke free  Substance Use Topics  . Alcohol use: No    Alcohol/week: 0.0 standard drinks  . Drug use: No     Allergies   Cefaclor, Clarithromycin, Erythromycin, and Iodine   Review of Systems Review of Systems  Constitutional: Negative for fever.  Respiratory: Positive for cough, shortness of breath and wheezing.   Cardiovascular: Negative for chest pain and leg swelling.  All other systems reviewed and are negative.    Physical Exam Updated Vital Signs BP 116/88   Pulse 91   Temp 99.2 F (37.3 C) (Oral)   Resp 17   Ht 5\' 6"  (1.676 m)   Wt 65.3 kg   SpO2 93%   BMI 23.24 kg/m   Physical Exam Vitals signs and nursing  note reviewed.  Constitutional:      General: She is not in acute distress.    Appearance: She is well-developed. She is not ill-appearing or diaphoretic.  HENT:     Head: Normocephalic and atraumatic.     Right Ear: External ear normal.     Left Ear: External ear normal.     Nose: Nose normal.  Eyes:     General:        Right eye: No discharge.  Left eye: No discharge.  Cardiovascular:     Rate and Rhythm: Normal rate and regular rhythm.     Heart sounds: Normal heart sounds.  Pulmonary:     Effort: Pulmonary effort is normal. No tachypnea or accessory muscle usage.     Breath sounds: Decreased breath sounds and wheezing present.     Comments: Diffuse expiratory wheezes Abdominal:     Palpations: Abdomen is soft.     Tenderness: There is no abdominal tenderness.  Musculoskeletal:     Right lower leg: No edema.     Left lower leg: No edema.  Skin:    General: Skin is warm and dry.  Neurological:     Mental Status: She is alert.  Psychiatric:        Mood and Affect: Mood is not anxious.      ED Treatments / Results  Labs (all labs ordered are listed, but only abnormal results are displayed) Labs Reviewed  COMPREHENSIVE METABOLIC PANEL - Abnormal; Notable for the following components:      Result Value   Glucose, Bld 155 (*)    Total Bilirubin 1.3 (*)    All other components within normal limits  BRAIN NATRIURETIC PEPTIDE - Abnormal; Notable for the following components:   B Natriuretic Peptide 145.9 (*)    All other components within normal limits  CBC WITH DIFFERENTIAL/PLATELET - Abnormal; Notable for the following components:   WBC 12.4 (*)    MCV 100.2 (*)    Neutro Abs 11.0 (*)    All other components within normal limits  SARS CORONAVIRUS 2 (TAT 6-24 HRS)  BASIC METABOLIC PANEL  CBC    EKG EKG Interpretation  Date/Time:  Wednesday April 26 2019 18:24:23 EDT Ventricular Rate:  98 PR Interval:    QRS Duration: 71 QT Interval:  349 QTC  Calculation: 457 R Axis:   90 Text Interpretation:  Sinus tachycardia Paired ventricular premature complexes Consider right ventricular hypertrophy Poor data quality in current ECG precludes serial comparison Confirmed by Sherwood Gambler [phone]) on 04/26/2019 7:11:52 PM   Radiology Dg Chest Portable 1 View  Result Date: 04/26/2019 CLINICAL DATA:  78 year old female with cough and dyspnea. EXAM: PORTABLE CHEST 1 VIEW COMPARISON:  Chest radiograph dated 03/21/2019 and CT dated 08/11/2018. FINDINGS: There is emphysematous changes of the lungs. No focal consolidation, pleural effusion, or pneumothorax. Stable cardiac silhouette. Atherosclerotic calcification of the aortic arch. No acute osseous pathology. IMPRESSION: 1. No acute cardiopulmonary process. 2. Emphysema. Electronically Signed   By: Anner Crete M.D.   On: 04/26/2019 19:49    Procedures .Critical Care Performed by: Sherwood Gambler, MD Authorized by: Sherwood Gambler, MD   Critical care provider statement:    Critical care time (minutes):  35   Critical care time was exclusive of:  Separately billable procedures and treating other patients   Critical care was necessary to treat or prevent imminent or life-threatening deterioration of the following conditions:  Respiratory failure   Critical care was time spent personally by me on the following activities:  Discussions with consultants, evaluation of patient's response to treatment, examination of patient, ordering and performing treatments and interventions, ordering and review of laboratory studies, ordering and review of radiographic studies, pulse oximetry, re-evaluation of patient's condition, obtaining history from patient or surrogate and review of old charts   (including critical care time)  Medications Ordered in ED Medications  amLODipine (NORVASC) tablet 5 mg (has no administration in time range)  atorvastatin (LIPITOR) tablet 40 mg (  has no administration in time  range)  diltiazem (CARDIZEM CD) 24 hr capsule 120 mg (has no administration in time range)  ALPRAZolam (XANAX) tablet 0.5 mg (has no administration in time range)  buPROPion (WELLBUTRIN XL) 24 hr tablet 150 mg (has no administration in time range)  citalopram (CELEXA) tablet 40 mg (has no administration in time range)  apixaban (ELIQUIS) tablet 5 mg (has no administration in time range)  clonazePAM (KLONOPIN) tablet 1 mg (has no administration in time range)  calcium carbonate (OS-CAL) tablet 600 mg (has no administration in time range)  multivitamin with minerals tablet 1 tablet (has no administration in time range)  montelukast (SINGULAIR) tablet 10 mg (has no administration in time range)  acetaminophen (TYLENOL) tablet 650 mg (has no administration in time range)    Or  acetaminophen (TYLENOL) suppository 650 mg (has no administration in time range)  ondansetron (ZOFRAN) tablet 4 mg (has no administration in time range)    Or  ondansetron (ZOFRAN) injection 4 mg (has no administration in time range)  albuterol (PROVENTIL) (2.5 MG/3ML) 0.083% nebulizer solution 2.5 mg (has no administration in time range)  albuterol (PROVENTIL) (2.5 MG/3ML) 0.083% nebulizer solution 2.5 mg (has no administration in time range)  ipratropium (ATROVENT) nebulizer solution 0.5 mg (has no administration in time range)  budesonide (PULMICORT) nebulizer solution 0.25 mg (has no administration in time range)  methylPREDNISolone sodium succinate (SOLU-MEDROL) 40 mg/mL injection 40 mg (has no administration in time range)  albuterol (VENTOLIN HFA) 108 (90 Base) MCG/ACT inhaler 8 puff (8 puffs Inhalation Given 04/26/19 1940)  magnesium sulfate IVPB 2 g 50 mL (0 g Intravenous Stopped 04/26/19 2033)  albuterol (VENTOLIN HFA) 108 (90 Base) MCG/ACT inhaler 4 puff (4 puffs Inhalation Given 04/26/19 2100)     Initial Impression / Assessment and Plan / ED Course  I have reviewed the triage vital signs and the nursing  notes.  Pertinent labs & imaging results that were available during my care of the patient were reviewed by me and considered in my medical decision making (see chart for details).        Patient has had some improvement from albuterol. Given her degree of shortness of breath reported, given magnesium as well.  Given a second dose of albuterol.  At one point she had an acute worsening of her shortness of breath though this was temporary.  No pneumonia.  My suspicion for PE is pretty low as this sounds like worsening chronic lung disease.  Otherwise, I have sent novel coronavirus testing and she will be admitted to the hospitalist service.  SUNI AMEND was evaluated in Emergency Department on 04/26/2019 for the symptoms described in the history of present illness. She was evaluated in the context of the global COVID-19 pandemic, which necessitated consideration that the patient might be at risk for infection with the SARS-CoV-2 virus that causes COVID-19. Institutional protocols and algorithms that pertain to the evaluation of patients at risk for COVID-19 are in a state of rapid change based on information released by regulatory bodies including the CDC and federal and state organizations. These policies and algorithms were followed during the patient's care in the ED.   Final Clinical Impressions(s) / ED Diagnoses   Final diagnoses:  COPD exacerbation [facility])    ED Discharge Orders    None       Sherwood Gambler, MD 04/26/19 2207

## 2019-04-26 NOTE — ED Notes (Signed)
Pure wick has been placed. Suction set to 45mmHg.  

## 2019-04-27 ENCOUNTER — Ambulatory Visit: Payer: Medicare Other | Admitting: Adult Health

## 2019-04-27 DIAGNOSIS — I2510 Atherosclerotic heart disease of native coronary artery without angina pectoris: Secondary | ICD-10-CM | POA: Diagnosis not present

## 2019-04-27 DIAGNOSIS — M8580 Other specified disorders of bone density and structure, unspecified site: Secondary | ICD-10-CM | POA: Diagnosis present

## 2019-04-27 DIAGNOSIS — I10 Essential (primary) hypertension: Secondary | ICD-10-CM | POA: Diagnosis not present

## 2019-04-27 DIAGNOSIS — Z87891 Personal history of nicotine dependence: Secondary | ICD-10-CM | POA: Diagnosis not present

## 2019-04-27 DIAGNOSIS — I4891 Unspecified atrial fibrillation: Secondary | ICD-10-CM

## 2019-04-27 DIAGNOSIS — I2584 Coronary atherosclerosis due to calcified coronary lesion: Secondary | ICD-10-CM | POA: Diagnosis not present

## 2019-04-27 DIAGNOSIS — E785 Hyperlipidemia, unspecified: Secondary | ICD-10-CM

## 2019-04-27 DIAGNOSIS — Z85828 Personal history of other malignant neoplasm of skin: Secondary | ICD-10-CM | POA: Diagnosis not present

## 2019-04-27 DIAGNOSIS — D72829 Elevated white blood cell count, unspecified: Secondary | ICD-10-CM | POA: Diagnosis not present

## 2019-04-27 DIAGNOSIS — T380X5A Adverse effect of glucocorticoids and synthetic analogues, initial encounter: Secondary | ICD-10-CM | POA: Diagnosis not present

## 2019-04-27 DIAGNOSIS — I959 Hypotension, unspecified: Secondary | ICD-10-CM | POA: Diagnosis not present

## 2019-04-27 DIAGNOSIS — I480 Paroxysmal atrial fibrillation: Secondary | ICD-10-CM | POA: Diagnosis not present

## 2019-04-27 DIAGNOSIS — J9621 Acute and chronic respiratory failure with hypoxia: Secondary | ICD-10-CM | POA: Diagnosis not present

## 2019-04-27 DIAGNOSIS — Z8249 Family history of ischemic heart disease and other diseases of the circulatory system: Secondary | ICD-10-CM | POA: Diagnosis not present

## 2019-04-27 DIAGNOSIS — J441 Chronic obstructive pulmonary disease with (acute) exacerbation: Secondary | ICD-10-CM | POA: Diagnosis not present

## 2019-04-27 DIAGNOSIS — Z881 Allergy status to other antibiotic agents status: Secondary | ICD-10-CM | POA: Diagnosis not present

## 2019-04-27 DIAGNOSIS — J9811 Atelectasis: Secondary | ICD-10-CM | POA: Diagnosis not present

## 2019-04-27 DIAGNOSIS — Y92230 Patient room in hospital as the place of occurrence of the external cause: Secondary | ICD-10-CM | POA: Diagnosis not present

## 2019-04-27 DIAGNOSIS — Z803 Family history of malignant neoplasm of breast: Secondary | ICD-10-CM | POA: Diagnosis not present

## 2019-04-27 DIAGNOSIS — N3281 Overactive bladder: Secondary | ICD-10-CM | POA: Diagnosis not present

## 2019-04-27 DIAGNOSIS — H269 Unspecified cataract: Secondary | ICD-10-CM | POA: Diagnosis present

## 2019-04-27 DIAGNOSIS — Z20828 Contact with and (suspected) exposure to other viral communicable diseases: Secondary | ICD-10-CM | POA: Diagnosis not present

## 2019-04-27 DIAGNOSIS — E46 Unspecified protein-calorie malnutrition: Secondary | ICD-10-CM | POA: Diagnosis not present

## 2019-04-27 DIAGNOSIS — F410 Panic disorder [episodic paroxysmal anxiety] without agoraphobia: Secondary | ICD-10-CM | POA: Diagnosis not present

## 2019-04-27 DIAGNOSIS — L8990 Pressure ulcer of unspecified site, unspecified stage: Secondary | ICD-10-CM | POA: Diagnosis present

## 2019-04-27 DIAGNOSIS — I4892 Unspecified atrial flutter: Secondary | ICD-10-CM | POA: Diagnosis not present

## 2019-04-27 DIAGNOSIS — Z90710 Acquired absence of both cervix and uterus: Secondary | ICD-10-CM | POA: Diagnosis not present

## 2019-04-27 LAB — GLUCOSE, CAPILLARY
Glucose-Capillary: 190 mg/dL — ABNORMAL HIGH (ref 70–99)
Glucose-Capillary: 147 mg/dL — ABNORMAL HIGH (ref 70–99)
Glucose-Capillary: 129 mg/dL — ABNORMAL HIGH (ref 70–99)

## 2019-04-27 LAB — CBC
HCT: 41.2 % (ref 36.0–46.0)
Hemoglobin: 13.2 g/dL (ref 12.0–15.0)
MCH: 31.8 pg (ref 26.0–34.0)
MCHC: 32 g/dL (ref 30.0–36.0)
MCV: 99.3 fL (ref 80.0–100.0)
Platelets: 188 10*3/uL (ref 150–400)
RBC: 4.15 MIL/uL (ref 3.87–5.11)
RDW: 12.9 % (ref 11.5–15.5)
WBC: 7.2 10*3/uL (ref 4.0–10.5)
nRBC: 0 % (ref 0.0–0.2)

## 2019-04-27 LAB — BASIC METABOLIC PANEL
Anion gap: 12 (ref 5–15)
BUN: 18 mg/dL (ref 8–23)
CO2: 25 mmol/L (ref 22–32)
Calcium: 8.7 mg/dL — ABNORMAL LOW (ref 8.9–10.3)
Chloride: 102 mmol/L (ref 98–111)
Creatinine, Ser: 0.8 mg/dL (ref 0.44–1.00)
GFR calc Af Amer: >60 mL/min (ref >60)
GFR calc non Af Amer: >60 mL/min (ref >60)
Glucose, Bld: 150 mg/dL — ABNORMAL HIGH (ref 70–99)
Potassium: 4 mmol/L (ref 3.5–5.1)
Sodium: 139 mmol/L (ref 135–145)

## 2019-04-27 LAB — TROPONIN I (HIGH SENSITIVITY)
Troponin I (High Sensitivity): 8 ng/L (ref <18)
Troponin I (High Sensitivity): 8 ng/L (ref <18)

## 2019-04-27 LAB — TSH: TSH: 0.311 u[IU]/mL — ABNORMAL LOW (ref 0.350–4.500)

## 2019-04-27 LAB — SARS CORONAVIRUS 2 (TAT 6-24 HRS): SARS Coronavirus 2: NEGATIVE

## 2019-04-27 LAB — MRSA PCR SCREENING: MRSA by PCR: NEGATIVE

## 2019-04-27 MED ORDER — IPRATROPIUM-ALBUTEROL 0.5-2.5 (3) MG/3ML IN SOLN
3.0000 mL | Freq: Four times a day (QID) | RESPIRATORY_TRACT | Status: DC
  Administered 2019-04-27 (×2): 3 mL via RESPIRATORY_TRACT
  Filled 2019-04-27 (×2): qty 3

## 2019-04-27 MED ORDER — ORAL CARE MOUTH RINSE: 15.0000 mL | Freq: Two times a day (BID) | OROMUCOSAL | Status: DC

## 2019-04-27 MED ORDER — DILTIAZEM HCL ER COATED BEADS 180 MG PO CP24
180.0000 mg | ORAL_CAPSULE | Freq: Every day | ORAL | Status: DC
  Administered 2019-04-27 – 2019-05-03 (×7): 180 mg via ORAL
  Filled 2019-04-27 (×7): qty 1

## 2019-04-27 MED ORDER — ORAL CARE MOUTH RINSE
15.0000 mL | Freq: Two times a day (BID) | OROMUCOSAL | Status: DC
  Administered 2019-04-27 – 2019-05-03 (×12): 15 mL via OROMUCOSAL

## 2019-04-27 MED ORDER — IPRATROPIUM-ALBUTEROL 0.5-2.5 (3) MG/3ML IN SOLN
3.0000 mL | RESPIRATORY_TRACT | Status: DC | PRN
  Administered 2019-04-27: 3 mL via RESPIRATORY_TRACT
  Filled 2019-04-27: qty 3

## 2019-04-27 MED ORDER — CHLORHEXIDINE GLUCONATE CLOTH 2 % EX PADS
6.0000 | MEDICATED_PAD | Freq: Every day | CUTANEOUS | Status: DC
  Administered 2019-04-27 – 2019-05-02 (×6): 6 via TOPICAL

## 2019-04-27 MED ORDER — IPRATROPIUM-ALBUTEROL 0.5-2.5 (3) MG/3ML IN SOLN
3.0000 mL | RESPIRATORY_TRACT | Status: DC
  Administered 2019-04-28 – 2019-04-29 (×7): 3 mL via RESPIRATORY_TRACT
  Filled 2019-04-27 (×8): qty 3

## 2019-04-27 NOTE — Progress Notes (Signed)
Patient instructed on use of flutter; demonstrated good effort.

## 2019-04-27 NOTE — Progress Notes (Signed)
PROGRESS NOTE    Ariel Gonzalez  [id number] DOB: May 03, 1941 DOA: 04/26/2019 PCP: Gaynelle Arabian, MD    Brief Narrative:  78 year old female who presented with dyspnea.  She does have significant past medical history for COPD with chronic hypoxic respiratory failure, uses 4 L of supplemental oxygen per nasal cannula at home. She also has hypertension and paroxysmal atrial fibrillation which was recently diagnosed and placed on diltiazem and apixaban.  Patient reported dyspnea, wheezing and cough, she was diagnosed as an outpatient with a COPD exacerbation and received doxycycline and prednisone.  Despite outpatient medical therapy she had persistent worsening symptoms and came to the emergency room.  On her initial physical examination blood pressure was 122/82, pulse rate 94, respiratory 23, temperature 99.2, oxygen saturation 93%, her lungs had bilateral expiratory wheezing, no rales, heart S1-S2 present, tachycardic, abdomen was soft, no lower extremity edema. Sodium 142, potassium 4.6, chloride 102, bicarb 29, glucose 155, BUN 21, creatinine 0.84, white count 12.4, hemoglobin 13.5, hematocrit 42.2, platelets 210.  SARS COVID-19 was negative.  Her chest radiograph had hyperinflation with faint bibasilar atelectasis.  Initial EKG heart rate 98 bpm, with a normal axis, normal intervals, sinus rhythm, no ST segment or T wave changes.  Repeat EKG 146 bpm, normal axis, 2:1 flutter, inferior lateral ST depressions, no T wave abnormalities.  Patient was admitted to the hospital with a working diagnosis of acute on chronic hypoxic respiratory failure, complicated by atrial flutter with rapid ventricular response.  Assessment & Plan:   Principal Problem:   COPD exacerbation ([place]) Active Problems:   Coronary artery calcification   Hyperlipidemia   Paroxysmal atrial fibrillation ([facility])   Atrial fibrillation with RVR ([place])   1. Acute on chronic hypoxic respiratory failure due to COPD exacerbation.  Patient's dyspnea has improved but not yet back to baseline, her oxygenation is 97% on 4 LPM per [place]. Continue to have significant wheezing and rhonchi. Will continue with systemic corticosteroids with methylprednisolone 40 mg IV bid, budesonide nebs, duoneb scheduled q 6 H and as needed q2 H. Patient allergic to Azithromycin. Airway clearing techniques with flutter valve and chest physical therapy. Continue oxymetry monitoring.  2. Paroxysmal atrial fibrillation/ flutter. Patient this am is back on sinus rhythm personally reviewed telemetry, rate 78 to 95 bpm at rest, discontinued diltiazem drip . Will continue rate control with diltiazem po at higher dose 180 mg CD and continue  anticoagulation with apixaban. Continue telemetry monitoring.    3. HTN. Continue blood pressure control with amlodipine,   4. Dyslipidemia. Continue with atorvastatin.  5. Anxiety. Continue as needed alprazolam, bupropion, clonazepam and citalopram.    DVT prophylaxis: enoxaparin   Code Status:  full Family Communication: no family at the bedside  Disposition Plan/ discharge barriers: transfer to telemetry. Patient continue to be at high risk for worsening hypoxic respiratory failure and uncontrolled tachyarrhythmia, will change to inpatient, since she will need at least 2 midnights stay. She failed outpatient therapy with prednisone and doxycycline.   Body mass index is 23.52 kg/m. Malnutrition Type:      Malnutrition Characteristics:      Nutrition Interventions:     RN Pressure Injury Documentation:     Consultants:     Procedures:     Antimicrobials:       Subjective: Patient is feeling better but not yet back to her baseline, continue to have significant wheezing and congestion, no chest pain, no nausea or vomiting. She has chronic anxiety.   Objective: Vitals:  04/27/19 0500 04/27/19 0600 04/27/19 0711 04/27/19 0738  BP: (!) 112/47     Pulse: 78 88    Resp: (!) 23 (!) 22     Temp:   98 F (36.7 C)   TempSrc:   Oral   SpO2: 99% 96%  100%  Weight:      Height:        Intake/Output Summary (Last 24 hours) at 04/27/2019 0814 Last data filed at 04/27/2019 0500 Gross per 24 hour  Intake 20.52 ml  Output 500 ml  Net -479.48 ml   Filed Weights   04/26/19 1820 04/27/19 0300  Weight: 65.3 kg 66.1 kg    Examination:   General: Not in pain, noted dyspnea with minimal efforts.  Neurology: Awake and alert, non focal  E ENT: mild pallor, no icterus, oral mucosa moist Cardiovascular: No JVD. S1-S2 present, rhythmic, no gallops, rubs, or murmurs. No lower extremity edema. Pulmonary:  Positive breath sounds bilaterally, decreased air movement, positive expiratory wheezing and diffuse bilateral rhonchi, no rales.  Gastrointestinal. Abdomen with no organomegaly, non tender, no rebound or guarding Skin. No rashes Musculoskeletal: no joint deformities     Data Reviewed: I have personally reviewed following labs and imaging studies  CBC: Recent Labs  Lab 04/26/19 1939 04/27/19 0437  WBC 12.4* 7.2  NEUTROABS 11.0*  --   HGB 13.5 13.2  HCT 42.2 41.2  MCV 100.2* 99.3  PLT 210 0000000   Basic Metabolic Panel: Recent Labs  Lab 04/26/19 1939 04/27/19 0437  NA 142 139  K 4.6 4.0  CL 102 102  CO2 29 25  GLUCOSE 155* 150*  BUN 21 18  CREATININE 0.84 0.80  CALCIUM 9.0 8.7*   GFR: Estimated Creatinine Clearance: 54.3 mL/min (by C-G formula based on SCr of 0.8 mg/dL). Liver Function Tests: Recent Labs  Lab 04/26/19 1939  AST 31  ALT 26  ALKPHOS 75  BILITOT 1.3*  PROT 6.7  ALBUMIN 3.9   No results for input(s): LIPASE, AMYLASE in the last 168 hours. No results for input(s): AMMONIA in the last 168 hours. Coagulation Profile: No results for input(s): INR, PROTIME in the last 168 hours. Cardiac Enzymes: No results for input(s): CKTOTAL, CKMB, CKMBINDEX, TROPONINI in the last 168 hours. BNP (last 3 results) No results for input(s): PROBNP in the  last 8760 hours. HbA1C: No results for input(s): HGBA1C in the last 72 hours. CBG: No results for input(s): GLUCAP in the last 168 hours. Lipid Profile: No results for input(s): CHOL, HDL, LDLCALC, TRIG, CHOLHDL, LDLDIRECT in the last 72 hours. Thyroid Function Tests: Recent Labs    04/27/19 0624  TSH 0.311*   Anemia Panel: No results for input(s): VITAMINB12, FOLATE, FERRITIN, TIBC, IRON, RETICCTPCT in the last 72 hours.    Radiology Studies: I have reviewed all of the imaging during this hospital visit personally     Scheduled Meds: . amLODipine  5 mg Oral Daily  . apixaban  5 mg Oral BID  . atorvastatin  40 mg Oral q1800  . budesonide (PULMICORT) nebulizer solution  0.25 mg Nebulization BID  . buPROPion  150 mg Oral Daily  . calcium carbonate  1,250 mg Oral Daily  . Chlorhexidine Gluconate Cloth  6 each Topical Daily  . citalopram  40 mg Oral Daily  . clonazePAM  1 mg Oral BID  . diltiazem  120 mg Oral Daily  . levalbuterol  0.63 mg Nebulization Q6H  . mouth rinse  15 mL Mouth Rinse BID  .  methylPREDNISolone (SOLU-MEDROL) injection  40 mg Intravenous Q12H  . montelukast  10 mg Oral QHS  . multivitamin with minerals  1 tablet Oral Daily   Continuous Infusions: . diltiazem (CARDIZEM) infusion Stopped (04/27/19 0612)     LOS: 0 days        Mauricio Gerome Apley, MD

## 2019-04-28 LAB — CBC WITH DIFFERENTIAL/PLATELET
Abs Immature Granulocytes: 0.14 10*3/uL — ABNORMAL HIGH (ref 0.00–0.07)
Basophils Absolute: 0 10*3/uL (ref 0.0–0.1)
Basophils Relative: 0 %
Eosinophils Absolute: 0 10*3/uL (ref 0.0–0.5)
Eosinophils Relative: 0 %
HCT: 41.6 % (ref 36.0–46.0)
Hemoglobin: 13.1 g/dL (ref 12.0–15.0)
Immature Granulocytes: 1 %
Lymphocytes Relative: 3 %
Lymphs Abs: 0.5 10*3/uL — ABNORMAL LOW (ref 0.7–4.0)
MCH: 31.9 pg (ref 26.0–34.0)
MCHC: 31.5 g/dL (ref 30.0–36.0)
MCV: 101.2 fL — ABNORMAL HIGH (ref 80.0–100.0)
Monocytes Absolute: 0.6 10*3/uL (ref 0.1–1.0)
Monocytes Relative: 3 %
Neutro Abs: 17.3 10*3/uL — ABNORMAL HIGH (ref 1.7–7.7)
Neutrophils Relative %: 93 %
Platelets: 216 10*3/uL (ref 150–400)
RBC: 4.11 MIL/uL (ref 3.87–5.11)
RDW: 13.3 % (ref 11.5–15.5)
WBC: 18.5 10*3/uL — ABNORMAL HIGH (ref 4.0–10.5)
nRBC: 0 % (ref 0.0–0.2)

## 2019-04-28 LAB — GLUCOSE, CAPILLARY
Glucose-Capillary: 112 mg/dL — ABNORMAL HIGH (ref 70–99)
Glucose-Capillary: 137 mg/dL — ABNORMAL HIGH (ref 70–99)
Glucose-Capillary: 143 mg/dL — ABNORMAL HIGH (ref 70–99)
Glucose-Capillary: 161 mg/dL — ABNORMAL HIGH (ref 70–99)
Glucose-Capillary: 164 mg/dL — ABNORMAL HIGH (ref 70–99)

## 2019-04-28 LAB — BASIC METABOLIC PANEL
Anion gap: 10 (ref 5–15)
BUN: 25 mg/dL — ABNORMAL HIGH (ref 8–23)
CO2: 30 mmol/L (ref 22–32)
Calcium: 9.2 mg/dL (ref 8.9–10.3)
Chloride: 101 mmol/L (ref 98–111)
Creatinine, Ser: 0.75 mg/dL (ref 0.44–1.00)
GFR calc Af Amer: >60 mL/min (ref >60)
GFR calc non Af Amer: >60 mL/min (ref >60)
Glucose, Bld: 165 mg/dL — ABNORMAL HIGH (ref 70–99)
Potassium: 4.3 mmol/L (ref 3.5–5.1)
Sodium: 141 mmol/L (ref 135–145)

## 2019-04-28 MED ORDER — DEXMEDETOMIDINE HCL IN NACL 200 MCG/50ML IV SOLN
0.2000 ug/kg/h | INTRAVENOUS | Status: DC
  Administered 2019-04-28: 0.2 ug/kg/h via INTRAVENOUS
  Filled 2019-04-28: qty 50

## 2019-04-28 MED ORDER — PHENOL 1.4 % MT LIQD
1.0000 | OROMUCOSAL | Status: DC | PRN
  Administered 2019-04-28: 14:00:00 1 via OROMUCOSAL
  Filled 2019-04-28: qty 177

## 2019-04-28 MED ORDER — LEVALBUTEROL HCL 1.25 MG/0.5ML IN NEBU
2.5000 mg | INHALATION_SOLUTION | Freq: Once | RESPIRATORY_TRACT | Status: AC
  Administered 2019-04-28: 2.5 mg via RESPIRATORY_TRACT

## 2019-04-28 NOTE — Progress Notes (Signed)
This RN was in the room when pt stated " I did not fall, I had a dream I was falling  at home. I saw the light by the bathroom, and I thought I was at home." This RN assessed and reoriented the patient and she was able to answer all neuro questions, and had no signs or symptoms of a fall. The patient then stated " Only my feet touched the floor." Patient has call bell within reach and bed alarm on. RN will continue to monitor.

## 2019-04-28 NOTE — Progress Notes (Signed)
eLink Physician-Brief Progress Note Patient Name: Ariel Gonzalez DOB: September 21, 1940 MRN: [id number]   Date of Service  04/28/2019  HPI/Events of Note  Patient with COPD exacerbation , and acute anxiety .  eICU Interventions  Precedex 0.2-0.8 mic ordered for anxiolysis. It is to be held for heart rate < 55.        Kerry Kass Ogan 04/28/2019, 12:36 AM

## 2019-04-28 NOTE — Progress Notes (Signed)
RN Venola Felling went in 1234 Room because RN Alexzavier Girardin was in the middle of cleaning another pt. RN Feltman found the pt. All the way in bed, but seeming very confused and with a bleeding IV. Pt. States "I fell on the ground and was laying on the floor crying and I was at home." Pt. Was confused compared to her recent A&Ox4. Pt. Was quickly re-oriented and asked about the fall and she said that her feet touched the floor but she never fell. RN checked pt. closely to assess for any signs of a fall and there were none. Pt. Then became increasingly agitated and anxious RN tried contacting doctor and had to eventually get in touch with E link. Pt. Had already received 4 nebulizers during the shift and seemed to need something to relax. CCM MD put in an order for a precedex drip. Night RN Parthenia Ames is starting. RN Parthenia Ames will continue to monitor pt. Closely.

## 2019-04-28 NOTE — Progress Notes (Signed)
PROGRESS NOTE    Ariel Gonzalez  [id number] DOB: 1940/12/19 DOA: 04/26/2019 PCP: Gaynelle Arabian, MD    Brief Narrative:  78 year old female who presented with dyspnea.  She does have significant past medical history for COPD with chronic hypoxic respiratory failure, uses 4 L of supplemental oxygen per nasal cannula at home. She also has hypertension and paroxysmal atrial fibrillation which was recently diagnosed and placed on diltiazem and apixaban.  Patient reported dyspnea, wheezing and cough, she was diagnosed as an outpatient with a COPD exacerbation and received doxycycline and prednisone.  Despite outpatient medical therapy she had persistent worsening symptoms and came to the emergency room. SARS COVID-19 was negative.  Her chest radiograph had hyperinflation with faint bibasilar atelectasis. Patient was admitted to the hospital with a working diagnosis of acute on chronic hypoxic respiratory failure, complicated by atrial flutter with rapid ventricular response.  Assessment & Plan:   Principal Problem:   COPD exacerbation ([place]) Active Problems:   Coronary artery calcification   Hyperlipidemia   Paroxysmal atrial fibrillation ([facility])   Atrial fibrillation with RVR (H[facility])   Acute on chronic hypoxic respiratory failure due to COPD exacerbation Afebrile with leukocytosis (steroid use) Noted to be wheezing diffusely, still requiring supplemental oxygen, not at her baseline Continue IV methylprednisolone, budesonide nebs, duoneb Patient just completed a course of doxycycline about 3 weeks ago, allergic to Azithromycin, will not restart another course for now  Flutter valve and chest physical therapy Continuous pulse ox Continue to monitor in SDU  Paroxysmal atrial fibrillation/ flutter Currently rate controlled Continue p.o. Cardizem Anticoagulation with apixaban   HTN BP on the soft side Held home amlodipine for now  Dyslipidemia Continue with atorvastatin.  Anxiety  Patient required Precedex for severe agitation/anxiety overnight, was tapered off due to significant hypotension Continue as needed alprazolam, bupropion, clonazepam and citalopram   DVT prophylaxis: Apixaban Code Status:  full Family Communication: Spoke to daughter in details on 04/28/2019 Disposition Plan/ discharge barriers: Continue to monitor in SDU Body mass index is 23.52 kg/m. Malnutrition Type:      Malnutrition Characteristics:      Nutrition Interventions:     RN Pressure Injury Documentation:     Consultants:   None  Procedures:   None  Antimicrobials:   None   Subjective: Patient seen and examined at bedside, still noted to be significantly wheezing bilaterally, denies any chest pain, fever/chills, abdominal pain, diarrhea.  Patient's anxiety noted to be worsening, required Precedex drip for a few hours yesterday which was tapered off due to hypotension.  Objective: Vitals:   04/28/19 1400 04/28/19 1500 04/28/19 1600 04/28/19 1700  BP:  121/61 (!) 117/46 (!) 96/39  Pulse: 89 90 85 80  Resp: (!) 28 (!) 22 (!) 24 19  Temp:      TempSrc:      SpO2: 100% 95% 96% 96%  Weight:      Height:        Intake/Output Summary (Last 24 hours) at 04/28/2019 1831 Last data filed at 04/28/2019 0725 Gross per 24 hour  Intake 7.4 ml  Output 450 ml  Net -442.6 ml   Filed Weights   04/26/19 1820 04/27/19 0300  Weight: 65.3 kg 66.1 kg    Examination:   General:  Noted dyspnea, ill-appearing  Cardiovascular: S1, S2 present  Respiratory:  Diffuse expiratory wheezing noted bilaterally, diminished air entry bilaterally  Abdomen: Soft, nontender, nondistended, bowel sounds present  Musculoskeletal: No bilateral pedal edema noted  Skin: Normal  Psychiatry:  Normal mood     Data Reviewed: I have personally reviewed following labs and imaging studies  CBC: Recent Labs  Lab 04/26/19 1939 04/27/19 0437 04/28/19 0818  WBC 12.4* 7.2 18.5*   NEUTROABS 11.0*  --  17.3*  HGB 13.5 13.2 13.1  HCT 42.2 41.2 41.6  MCV 100.2* 99.3 101.2*  PLT 210 188 [id number]   Basic Metabolic Panel: Recent Labs  Lab 04/26/19 1939 04/27/19 0437 04/28/19 0818  NA 142 139 141  K 4.6 4.0 4.3  CL 102 102 101  CO2 29 25 30   GLUCOSE 155* 150* 165*  BUN 21 18 25*  CREATININE 0.84 0.80 0.75  CALCIUM 9.0 8.7* 9.2   GFR: Estimated Creatinine Clearance: 54.3 mL/min (by C-G formula based on SCr of 0.75 mg/dL). Liver Function Tests: Recent Labs  Lab 04/26/19 1939  AST 31  ALT 26  ALKPHOS 75  BILITOT 1.3*  PROT 6.7  ALBUMIN 3.9   No results for input(s): LIPASE, AMYLASE in the last 168 hours. No results for input(s): AMMONIA in the last 168 hours. Coagulation Profile: No results for input(s): INR, PROTIME in the last 168 hours. Cardiac Enzymes: No results for input(s): CKTOTAL, CKMB, CKMBINDEX, TROPONINI in the last 168 hours. BNP (last 3 results) No results for input(s): PROBNP in the last 8760 hours. HbA1C: No results for input(s): HGBA1C in the last 72 hours. CBG: Recent Labs  Lab 04/27/19 1922 04/28/19 0023 04/28/19 0358 04/28/19 0733 04/28/19 1141  GLUCAP 129* 137* 112* 143* 161*   Lipid Profile: No results for input(s): CHOL, HDL, LDLCALC, TRIG, CHOLHDL, LDLDIRECT in the last 72 hours. Thyroid Function Tests: Recent Labs    04/27/19 0624  TSH 0.311*   Anemia Panel: No results for input(s): VITAMINB12, FOLATE, FERRITIN, TIBC, IRON, RETICCTPCT in the last 72 hours.    Radiology Studies: I have reviewed all of the imaging during this hospital visit personally     Scheduled Meds: . apixaban  5 mg Oral BID  . atorvastatin  40 mg Oral q1800  . budesonide (PULMICORT) nebulizer solution  0.25 mg Nebulization BID  . buPROPion  150 mg Oral Daily  . calcium carbonate  1,250 mg Oral Daily  . Chlorhexidine Gluconate Cloth  6 each Topical Daily  . citalopram  40 mg Oral Daily  . clonazePAM  1 mg Oral BID  . diltiazem  180  mg Oral Daily  . ipratropium-albuterol  3 mL Nebulization Q4H  . mouth rinse  15 mL Mouth Rinse BID  . methylPREDNISolone (SOLU-MEDROL) injection  40 mg Intravenous Q12H  . montelukast  10 mg Oral QHS  . multivitamin with minerals  1 tablet Oral Daily   Continuous Infusions: . dexmedetomidine (PRECEDEX) IV infusion Stopped (04/28/19 0217)     LOS: 1 day        Alma Friendly, MD

## 2019-04-29 LAB — BASIC METABOLIC PANEL
Anion gap: 9 (ref 5–15)
BUN: 19 mg/dL (ref 8–23)
CO2: 31 mmol/L (ref 22–32)
Calcium: 8.9 mg/dL (ref 8.9–10.3)
Chloride: 101 mmol/L (ref 98–111)
Creatinine, Ser: 0.66 mg/dL (ref 0.44–1.00)
GFR calc Af Amer: >60 mL/min (ref >60)
GFR calc non Af Amer: >60 mL/min (ref >60)
Glucose, Bld: 144 mg/dL — ABNORMAL HIGH (ref 70–99)
Potassium: 4.6 mmol/L (ref 3.5–5.1)
Sodium: 141 mmol/L (ref 135–145)

## 2019-04-29 LAB — CBC WITH DIFFERENTIAL/PLATELET
Abs Immature Granulocytes: 0.09 10*3/uL — ABNORMAL HIGH (ref 0.00–0.07)
Basophils Absolute: 0 10*3/uL (ref 0.0–0.1)
Basophils Relative: 0 %
Eosinophils Absolute: 0 10*3/uL (ref 0.0–0.5)
Eosinophils Relative: 0 %
HCT: 37.7 % (ref 36.0–46.0)
Hemoglobin: 11.9 g/dL — ABNORMAL LOW (ref 12.0–15.0)
Immature Granulocytes: 1 %
Lymphocytes Relative: 3 %
Lymphs Abs: 0.4 10*3/uL — ABNORMAL LOW (ref 0.7–4.0)
MCH: 31.9 pg (ref 26.0–34.0)
MCHC: 31.6 g/dL (ref 30.0–36.0)
MCV: 101.1 fL — ABNORMAL HIGH (ref 80.0–100.0)
Monocytes Absolute: 0.6 10*3/uL (ref 0.1–1.0)
Monocytes Relative: 4 %
Neutro Abs: 13.9 10*3/uL — ABNORMAL HIGH (ref 1.7–7.7)
Neutrophils Relative %: 92 %
Platelets: 195 10*3/uL (ref 150–400)
RBC: 3.73 MIL/uL — ABNORMAL LOW (ref 3.87–5.11)
RDW: 13.5 % (ref 11.5–15.5)
WBC: 15 10*3/uL — ABNORMAL HIGH (ref 4.0–10.5)
nRBC: 0 % (ref 0.0–0.2)

## 2019-04-29 LAB — T4, FREE: Free T4: 0.88 ng/dL (ref 0.61–1.12)

## 2019-04-29 MED ORDER — IPRATROPIUM-ALBUTEROL 0.5-2.5 (3) MG/3ML IN SOLN
3.0000 mL | RESPIRATORY_TRACT | Status: DC
  Administered 2019-04-29 – 2019-05-03 (×25): 3 mL via RESPIRATORY_TRACT
  Filled 2019-04-29 (×24): qty 3

## 2019-04-29 MED ORDER — IPRATROPIUM-ALBUTEROL 0.5-2.5 (3) MG/3ML IN SOLN
3.0000 mL | Freq: Four times a day (QID) | RESPIRATORY_TRACT | Status: DC
  Filled 2019-04-29: qty 3

## 2019-04-29 MED ORDER — METOPROLOL TARTRATE 5 MG/5ML IV SOLN
5.0000 mg | Freq: Once | INTRAVENOUS | Status: AC
  Administered 2019-04-29: 22:00:00 5 mg via INTRAVENOUS
  Filled 2019-04-29: qty 5

## 2019-04-29 MED ORDER — GUAIFENESIN-DM 100-10 MG/5ML PO SYRP
5.0000 mL | ORAL_SOLUTION | ORAL | Status: DC | PRN
  Administered 2019-04-29 – 2019-05-02 (×3): 5 mL via ORAL
  Filled 2019-04-29 (×3): qty 10

## 2019-04-29 NOTE — Evaluation (Addendum)
Physical Therapy Evaluation Patient Details Name: Ariel Gonzalez MRN: [id number] DOB: Jul 01, 1941 Today's Date: 04/29/2019   History of Present Illness  78 year old female who presented with dyspnea. Dx of COPD exacerbation.  PMH of COPD with chronic hypoxic respiratory failure, uses 4 L of supplemental oxygen per nasal cannula at home. She also has hypertension and paroxysmal atrial fibrillation  Clinical Impression  Pt admitted with above diagnosis. Min assist for stand pivot transfers, pt is unsteady in standing and fatigues very quickly. SaO2 87% on 4L O2 with transfers, 94% on 4L O2 at rest. Instructed pt in LE strengthening exercises.  Assistance recommended for mobility. Pt stated her daughter can assist her at home as needed. At baseline she ambulates short distances without an assistive device.  Pt currently with functional limitations due to the deficits listed below (see PT Problem List). Pt will benefit from skilled PT to increase their independence and safety with mobility to allow discharge to the venue listed below.       Follow Up Recommendations Home health PT; assistance for mobility    Equipment Recommendations  Rolling walker with 5" wheels(4 wheeled RW with seat)    Recommendations for Other Services       Precautions / Restrictions Precautions Precautions: Fall Precaution Comments: pt denies h/o falls in past 1 year Restrictions Weight Bearing Restrictions: No      Mobility  Bed Mobility Overal bed mobility: Modified Independent             General bed mobility comments: HOB up, used bedrail  Transfers Overall transfer level: Needs assistance Equipment used: Rolling walker (2 wheeled) Transfers: Sit to/from [facility] Sit to Stand: Min assist Stand pivot transfers: Min assist       General transfer comment: assist to rise and to steady, pt mildly unsteady during transfer, fatigued quickly and had uncontrolled descent to recliner;  SPT x 3 bed to recliner to [facility] to recliner; SaO2 87% on 4L O2 with transfers, 94% on 4L O2 at rest, 3/4 dyspnea with transfers  Ambulation/Gait                Stairs            Wheelchair Mobility    Modified Rankin (Stroke Patients Only)       Balance Overall balance assessment: Needs assistance   Sitting balance-Leahy Scale: Good     Standing balance support: Single extremity supported Standing balance-Leahy Scale: Poor Standing balance comment: requires single UE support                             Pertinent Vitals/Pain Pain Assessment: No/denies pain    Home Living Family/patient expects to be discharged to:: Private residence Living Arrangements: Alone Available Help at Discharge: Family;Available PRN/intermittently   Home Access: Level entry     Home Layout: One level Home Equipment: Shower seat      Prior Function Level of Independence: Independent         Comments: walks short distances only at home, daughter lives nearby and does shopping, daughter can assist as needed as she is on leave from work     Journalist, newspaper        Extremity/Trunk Assessment   Upper Extremity Assessment Upper Extremity Assessment: Overall WFL for tasks assessed    Lower Extremity Assessment Lower Extremity Assessment: Overall WFL for tasks assessed    Cervical / Trunk Assessment Cervical / Trunk Assessment:  Normal  Communication   Communication: No difficulties  Cognition Arousal/Alertness: Awake/alert Behavior During Therapy: WFL for tasks assessed/performed Overall Cognitive Status: Within Functional Limits for tasks assessed                                        General Comments      Exercises  ankle pumps x 20 AROM both supine Quad sets x 5 both AROM supine   Assessment/Plan    PT Assessment Patient needs continued PT services  PT Problem List Cardiopulmonary status limiting activity;Decreased  balance;Decreased activity tolerance;Decreased mobility       PT Treatment Interventions Gait training;DME instruction;Therapeutic activities;Therapeutic exercise;Balance training;Functional mobility training;Patient/family education    PT Goals (Current goals can be found in the Care Plan section)  Acute Rehab PT Goals Patient Stated Goal: DC home PT Goal Formulation: With patient Time For Goal Achievement: 05/13/19 Potential to Achieve Goals: Good    Frequency Min 3X/week   Barriers to discharge        Co-evaluation               AM-PAC PT "6 Clicks" Mobility  Outcome Measure Help needed turning from your back to your side while in a flat bed without using bedrails?: A Little Help needed moving from lying on your back to sitting on the side of a flat bed without using bedrails?: A Little Help needed moving to and from a bed to a chair (including a wheelchair)?: A Little Help needed standing up from a chair using your arms (e.g., wheelchair or bedside chair)?: A Little Help needed to walk in hospital room?: Total Help needed climbing 3-5 steps with a railing? : Total 6 Click Score: 14    End of Session Equipment Utilized During Treatment: Gait belt;Oxygen Activity Tolerance: No increased pain;Treatment limited secondary to medical complications (Comment)(SaO2 87% on 4L O2 with transfers; 94% on 4L at rest) Patient left: in chair;with call bell/phone within reach Nurse Communication: Mobility status PT Visit Diagnosis: Unsteadiness on feet (R26.81);Difficulty in walking, not elsewhere classified (R26.2)    Time: [id number] PT Time Calculation (min) (ACUTE ONLY): 27 min   Charges:   PT Evaluation $PT Eval Moderate Complexity: 1 Mod PT Treatments $Therapeutic Activity: 8-22 mins      Blondell Reveal Kistler PT 04/29/2019  Acute Rehabilitation Services Pager [phone] Office [phone]

## 2019-04-29 NOTE — Progress Notes (Signed)
OT Cancellation Note  Patient Details Name: NYANA ZELLMAN MRN: [id number] DOB: 1940-10-22   Cancelled Treatment:    Reason Eval/Treat Not Completed: Fatigue/lethargy limiting ability to participate   Pt resting.  Explained OT role to pts daughter. Will check back on pt next day  Kari Baars, [place] [phone] Office- [phone], Thereasa Parkin 04/29/2019, 2:31 PM

## 2019-04-29 NOTE — Progress Notes (Signed)
PROGRESS NOTE    Ariel Gonzalez  [id number] DOB: 1941-07-01 DOA: 04/26/2019 PCP: Gaynelle Arabian, MD    Brief Narrative:  78 year old female who presented with dyspnea.  She does have significant past medical history for COPD with chronic hypoxic respiratory failure, uses 4 L of supplemental oxygen per nasal cannula at home. She also has hypertension and paroxysmal atrial fibrillation which was recently diagnosed and placed on diltiazem and apixaban.  Patient reported dyspnea, wheezing and cough, she was diagnosed as an outpatient with a COPD exacerbation and received doxycycline and prednisone.  Despite outpatient medical therapy she had persistent worsening symptoms and came to the emergency room. SARS COVID-19 was negative.  Her chest radiograph had hyperinflation with faint bibasilar atelectasis. Patient was admitted to the hospital with a working diagnosis of acute on chronic hypoxic respiratory failure, complicated by atrial flutter with rapid ventricular response.  Assessment & Plan:   Principal Problem:   COPD exacerbation ([place]) Active Problems:   Coronary artery calcification   Hyperlipidemia   Paroxysmal atrial fibrillation (H[facility])   Atrial fibrillation with RVR (H[facility])   Acute on chronic hypoxic respiratory failure due to COPD exacerbation Afebrile with leukocytosis (steroid use) Noted to be wheezing diffusely, still requiring supplemental oxygen, not at her baseline Continue IV methylprednisolone, budesonide nebs, duoneb Patient just completed a course of doxycycline about 3 weeks ago, allergic to Azithromycin, will not restart another course for now  Flutter valve and chest physical therapy Continuous pulse ox Continue to monitor in SDU  Paroxysmal atrial fibrillation/ flutter Currently rate controlled Continue p.o. Cardizem Anticoagulation with apixaban   HTN BP stable Held home amlodipine for now  Dyslipidemia Continue with atorvastatin.  Anxiety Patient  required Precedex for severe agitation/anxiety on 04/28/19, was tapered off due to significant hypotension Continue as needed alprazolam, bupropion, clonazepam and citalopram   DVT prophylaxis: Apixaban Code Status:  full Family Communication: Spoke to daughter in details on 04/28/2019 Disposition Plan/ discharge barriers: Continue to monitor in SDU Body mass index is 23.52 kg/m. Malnutrition Type:      Malnutrition Characteristics:      Nutrition Interventions:     RN Pressure Injury Documentation:     Consultants:   None  Procedures:   None  Antimicrobials:   None   Subjective: Patient seen and examined at bedside, reports feeling slightly better, still noted to be wheezing hold to slightly improved from yesterday.  Patient denies any chest pains, abdominal pain, nausea/vomiting, fever/chills  Objective: Vitals:   04/29/19 1151 04/29/19 1200 04/29/19 1201 04/29/19 1312  BP:  (!) 150/84    Pulse: 77 86 82   Resp: 12 (!) 35 (!) 22   Temp:    (!) 97.5 F (36.4 C)  TempSrc:    Oral  SpO2: 98% 97% 98%   Weight:      Height:        Intake/Output Summary (Last 24 hours) at 04/29/2019 1537 Last data filed at 04/29/2019 0800 Gross per 24 hour  Intake -  Output 1150 ml  Net -1150 ml   Filed Weights   04/26/19 1820 04/27/19 0300  Weight: 65.3 kg 66.1 kg    Examination:   General:  Mild distress due to dyspnea, chronically ill-appearing  Cardiovascular: S1, S2 present  Respiratory:  Diffuse expiratory wheezing noted bilaterally, with diminished air entry bilaterally  Abdomen: Soft, nontender, nondistended, bowel sounds present  Musculoskeletal: No bilateral pedal edema noted  Skin: Normal  Psychiatry: Normal mood   Data Reviewed: I have  personally reviewed following labs and imaging studies  CBC: Recent Labs  Lab 04/26/19 1939 04/27/19 0437 04/28/19 0818 04/29/19 0216  WBC 12.4* 7.2 18.5* 15.0*  NEUTROABS 11.0*  --  17.3* 13.9*   HGB 13.5 13.2 13.1 11.9*  HCT 42.2 41.2 41.6 37.7  MCV 100.2* 99.3 101.2* 101.1*  PLT 210 188 216 0000000   Basic Metabolic Panel: Recent Labs  Lab 04/26/19 1939 04/27/19 0437 04/28/19 0818 04/29/19 0216  NA 142 139 141 141  K 4.6 4.0 4.3 4.6  CL 102 102 101 101  CO2 29 25 30 31   GLUCOSE 155* 150* 165* 144*  BUN 21 18 25* 19  CREATININE 0.84 0.80 0.75 0.66  CALCIUM 9.0 8.7* 9.2 8.9   GFR: Estimated Creatinine Clearance: 54.3 mL/min (by C-G formula based on SCr of 0.66 mg/dL). Liver Function Tests: Recent Labs  Lab 04/26/19 1939  AST 31  ALT 26  ALKPHOS 75  BILITOT 1.3*  PROT 6.7  ALBUMIN 3.9   No results for input(s): LIPASE, AMYLASE in the last 168 hours. No results for input(s): AMMONIA in the last 168 hours. Coagulation Profile: No results for input(s): INR, PROTIME in the last 168 hours. Cardiac Enzymes: No results for input(s): CKTOTAL, CKMB, CKMBINDEX, TROPONINI in the last 168 hours. BNP (last 3 results) No results for input(s): PROBNP in the last 8760 hours. HbA1C: No results for input(s): HGBA1C in the last 72 hours. CBG: Recent Labs  Lab 04/28/19 0023 04/28/19 0358 04/28/19 0733 04/28/19 1141 04/28/19 1954  GLUCAP 137* 112* 143* 161* 164*   Lipid Profile: No results for input(s): CHOL, HDL, LDLCALC, TRIG, CHOLHDL, LDLDIRECT in the last 72 hours. Thyroid Function Tests: Recent Labs    04/27/19 0624  TSH 0.311*   Anemia Panel: No results for input(s): VITAMINB12, FOLATE, FERRITIN, TIBC, IRON, RETICCTPCT in the last 72 hours.    Radiology Studies: I have reviewed all of the imaging during this hospital visit personally     Scheduled Meds: . apixaban  5 mg Oral BID  . atorvastatin  40 mg Oral q1800  . budesonide (PULMICORT) nebulizer solution  0.25 mg Nebulization BID  . buPROPion  150 mg Oral Daily  . calcium carbonate  1,250 mg Oral Daily  . Chlorhexidine Gluconate Cloth  6 each Topical Daily  . citalopram  40 mg Oral Daily  .  clonazePAM  1 mg Oral BID  . diltiazem  180 mg Oral Daily  . ipratropium-albuterol  3 mL Nebulization Q4H  . mouth rinse  15 mL Mouth Rinse BID  . methylPREDNISolone (SOLU-MEDROL) injection  40 mg Intravenous Q12H  . montelukast  10 mg Oral QHS  . multivitamin with minerals  1 tablet Oral Daily   Continuous Infusions: . dexmedetomidine (PRECEDEX) IV infusion Stopped (04/28/19 0217)     LOS: 2 days        Alma Friendly, MD

## 2019-04-30 LAB — BASIC METABOLIC PANEL
Anion gap: 5 (ref 5–15)
BUN: 21 mg/dL (ref 8–23)
CO2: 35 mmol/L — ABNORMAL HIGH (ref 22–32)
Calcium: 8.9 mg/dL (ref 8.9–10.3)
Chloride: 100 mmol/L (ref 98–111)
Creatinine, Ser: 0.67 mg/dL (ref 0.44–1.00)
GFR calc Af Amer: >60 mL/min (ref >60)
GFR calc non Af Amer: >60 mL/min (ref >60)
Glucose, Bld: 141 mg/dL — ABNORMAL HIGH (ref 70–99)
Potassium: 4.4 mmol/L (ref 3.5–5.1)
Sodium: 140 mmol/L (ref 135–145)

## 2019-04-30 LAB — CBC WITH DIFFERENTIAL/PLATELET
Abs Immature Granulocytes: 0.07 10*3/uL (ref 0.00–0.07)
Basophils Absolute: 0 10*3/uL (ref 0.0–0.1)
Basophils Relative: 0 %
Eosinophils Absolute: 0 10*3/uL (ref 0.0–0.5)
Eosinophils Relative: 0 %
HCT: 38.7 % (ref 36.0–46.0)
Hemoglobin: 12.4 g/dL (ref 12.0–15.0)
Immature Granulocytes: 1 %
Lymphocytes Relative: 4 %
Lymphs Abs: 0.4 10*3/uL — ABNORMAL LOW (ref 0.7–4.0)
MCH: 32 pg (ref 26.0–34.0)
MCHC: 32 g/dL (ref 30.0–36.0)
MCV: 99.7 fL (ref 80.0–100.0)
Monocytes Absolute: 0.5 10*3/uL (ref 0.1–1.0)
Monocytes Relative: 4 %
Neutro Abs: 10 10*3/uL — ABNORMAL HIGH (ref 1.7–7.7)
Neutrophils Relative %: 91 %
Platelets: 215 10*3/uL (ref 150–400)
RBC: 3.88 MIL/uL (ref 3.87–5.11)
RDW: 13.2 % (ref 11.5–15.5)
WBC: 11 10*3/uL — ABNORMAL HIGH (ref 4.0–10.5)
nRBC: 0 % (ref 0.0–0.2)

## 2019-04-30 MED ORDER — PREDNISONE 20 MG PO TABS
40.0000 mg | ORAL_TABLET | Freq: Every day | ORAL | Status: DC
  Administered 2019-05-01: 40 mg via ORAL
  Filled 2019-04-30: qty 2

## 2019-04-30 NOTE — Progress Notes (Signed)
PROGRESS NOTE    Ariel Gonzalez  [id number] DOB: Mar 22, 1941 DOA: 04/26/2019 PCP: Gaynelle Arabian, MD   Brief Narrative:  Patient is a 78 year old female with history of COPD, chronic hypoxia specified on 4 L of oxygen per minute at home, paroxysmal A. fib, hypertension who presented with dyspnea, wheezing, cough.  She was also found to be in A. fib with RVR on presentation.  Patient was admitted for COPD exacerbation, A. fib with RVR .Currently her respiratory status is improving.  Heart rate well controlled.  Assessment & Plan:   Principal Problem:   COPD exacerbation ([place]) Active Problems:   Coronary artery calcification   Hyperlipidemia   Paroxysmal atrial fibrillation (HCC)   Atrial fibrillation with RVR (HCC)   Acute on chronic hypoxic respiratory failure due to COPD exacerbation: Has history of known COPD, on 4 L of oxygen at home.  Follows with pulmonology as an outpatient.  Continue bronchodilators, steroids.  Respiratory status has improved.  She just completed course of antibiotics.  Continue chest physical therapy  Paroxysmal A. fib/flutter: Heart rate well controlled this morning.  Continue p.o. Cardizem.  Continue anticoagulation with Eliquis  Hypertension: Currently blood pressure stable.  Hyperlipidemia: Continue Lipitor  Anxiety: Required Precedex for severe anxiety/agitation.  Continue as needed alprazolam and is scheduled bupropion, citalopram.  Debility/deconditioning: PT/OT recommended home health.         DVT prophylaxis:eliquis Code Status: Full Family Communication: None present at the bedside Disposition Plan: Home tomorrow   Consultants: PCCM  Procedures: None  Antimicrobials:  Anti-infectives (From admission, onward)   None      Subjective: Patient seen and examined at bedside this morning.  She feels much better today.  Respiratory status stable.  Currently on 4 L of oxygen per minute.  Still little weak and does not feel ready to  go home.  Objective: Vitals:   04/30/19 0400 04/30/19 0600 04/30/19 0819 04/30/19 0834  BP: (!) 105/55 132/75    Pulse: 67 74  78  Resp: (!) 21 (!) 22  (!) 22  Temp:   97.9 F (36.6 C)   TempSrc:   Axillary   SpO2: 99% 99%  96%  Weight:      Height:        Intake/Output Summary (Last 24 hours) at 04/30/2019 0856 Last data filed at 04/30/2019 0600 Gross per 24 hour  Intake -  Output 800 ml  Net -800 ml   Filed Weights   04/26/19 1820 04/27/19 0300  Weight: 65.3 kg 66.1 kg    Examination:  General exam: Not in distress,average built, deconditioned, debilitated HEENT:PERRL,Oral mucosa moist, Ear/Nose normal on gross exam Respiratory system: Bilateral decreased air entry Cardiovascular system: S1 & S2 heard, RRR. No JVD, murmurs, rubs, gallops or clicks. No pedal edema. Gastrointestinal system: Abdomen is nondistended, soft and nontender. No organomegaly or masses felt. Normal bowel sounds heard. Central nervous system: Alert and oriented. No focal neurological deficits. Extremities: No edema, no clubbing ,no cyanosis, distal peripheral pulses palpable. Skin: No rashes, lesions or ulcers,no icterus ,no pallor    Data Reviewed: I have personally reviewed following labs and imaging studies  CBC: Recent Labs  Lab 04/26/19 1939 04/27/19 0437 04/28/19 0818 04/29/19 0216 04/30/19 0312  WBC 12.4* 7.2 18.5* 15.0* 11.0*  NEUTROABS 11.0*  --  17.3* 13.9* 10.0*  HGB 13.5 13.2 13.1 11.9* 12.4  HCT 42.2 41.2 41.6 37.7 38.7  MCV 100.2* 99.3 101.2* 101.1* 99.7  PLT 210 188 216 195 215  Basic Metabolic Panel: Recent Labs  Lab 04/26/19 1939 04/27/19 0437 04/28/19 0818 04/29/19 0216 04/30/19 0312  NA 142 139 141 141 140  K 4.6 4.0 4.3 4.6 4.4  CL 102 102 101 101 100  CO2 29 25 30 31  35*  GLUCOSE 155* 150* 165* 144* 141*  BUN 21 18 25* 19 21  CREATININE 0.84 0.80 0.75 0.66 0.67  CALCIUM 9.0 8.7* 9.2 8.9 8.9   GFR: Estimated Creatinine Clearance: 54.3 mL/min (by  C-G formula based on SCr of 0.67 mg/dL). Liver Function Tests: Recent Labs  Lab 04/26/19 1939  AST 31  ALT 26  ALKPHOS 75  BILITOT 1.3*  PROT 6.7  ALBUMIN 3.9   No results for input(s): LIPASE, AMYLASE in the last 168 hours. No results for input(s): AMMONIA in the last 168 hours. Coagulation Profile: No results for input(s): INR, PROTIME in the last 168 hours. Cardiac Enzymes: No results for input(s): CKTOTAL, CKMB, CKMBINDEX, TROPONINI in the last 168 hours. BNP (last 3 results) No results for input(s): PROBNP in the last 8760 hours. HbA1C: No results for input(s): HGBA1C in the last 72 hours. CBG: Recent Labs  Lab 04/28/19 0023 04/28/19 0358 04/28/19 0733 04/28/19 1141 04/28/19 1954  GLUCAP 137* 112* 143* 161* 164*   Lipid Profile: No results for input(s): CHOL, HDL, LDLCALC, TRIG, CHOLHDL, LDLDIRECT in the last 72 hours. Thyroid Function Tests: Recent Labs    04/29/19 1605  FREET4 0.88   Anemia Panel: No results for input(s): VITAMINB12, FOLATE, FERRITIN, TIBC, IRON, RETICCTPCT in the last 72 hours. Sepsis Labs: No results for input(s): PROCALCITON, LATICACIDVEN in the last 168 hours.  Recent Results (from the past 240 hour(s))  SARS CORONAVIRUS 2 (TAT 6-24 HRS) Nasopharyngeal Nasopharyngeal Swab     Status: None   Collection Time: 04/26/19  7:39 PM   Specimen: Nasopharyngeal Swab  Result Value Ref Range Status   SARS Coronavirus 2 NEGATIVE NEGATIVE Final    Comment: (NOTE) SARS-CoV-2 target nucleic acids are NOT DETECTED. The SARS-CoV-2 RNA is generally detectable in upper and lower respiratory specimens during the acute phase of infection. Negative results do not preclude SARS-CoV-2 infection, do not rule out co-infections with other pathogens, and should not be used as the sole basis for treatment or other patient management decisions. Negative results must be combined with clinical observations, patient history, and epidemiological information. The  expected result is Negative. Fact Sheet for Patients: SugarRoll.be Fact Sheet for Healthcare Providers: https://www.woods-mathews.com/ This test is not yet approved or cleared by the [place] FDA and  has been authorized for detection and/or diagnosis of SARS-CoV-2 by FDA under an Emergency Use Authorization (EUA). This EUA will remain  in effect (meaning this test can be used) for the duration of the COVID-19 declaration under Section 56 4(b)(1) of the Act, 21 U.S.C. section 360bbb-3(b)(1), unless the authorization is terminated or revoked sooner. Performed at [facility], [place] [address] [place], [place] [address]   MRSA PCR Screening     Status: None   Collection Time: 04/27/19  3:40 AM  Result Value Ref Range Status   MRSA by PCR NEGATIVE NEGATIVE Final    Comment:        The GeneXpert MRSA Assay (FDA approved for NASAL specimens only), is one component of a comprehensive MRSA colonization surveillance program. It is not intended to diagnose MRSA infection nor to guide or monitor treatment for MRSA infections. Performed at [facility], [place] [address] [place], [place] [address]  Radiology Studies: No results found.      Scheduled Meds: . apixaban  5 mg Oral BID  . atorvastatin  40 mg Oral q1800  . budesonide (PULMICORT) nebulizer solution  0.25 mg Nebulization BID  . buPROPion  150 mg Oral Daily  . calcium carbonate  1,250 mg Oral Daily  . Chlorhexidine Gluconate Cloth  6 each Topical Daily  . citalopram  40 mg Oral Daily  . clonazePAM  1 mg Oral BID  . diltiazem  180 mg Oral Daily  . ipratropium-albuterol  3 mL Nebulization Q4H  . mouth rinse  15 mL Mouth Rinse BID  . methylPREDNISolone (SOLU-MEDROL) injection  40 mg Intravenous Q12H  . montelukast  10 mg Oral QHS  . multivitamin with minerals  1 tablet Oral Daily   Continuous Infusions: . dexmedetomidine (PRECEDEX) IV  infusion Stopped (04/28/19 0217)     LOS: 3 days    Time spent: 25 mins.More than 50% of that time was spent in counseling and/or coordination of care.      Shelly Coss, MD [facility] Pager [phone]  If 7PM-7AM, please contact night-coverage [email or web address] Password [email or web address] 04/30/2019, 8:56 AM

## 2019-04-30 NOTE — Progress Notes (Signed)
Late entry on 04/29/2019 around 2100, the pt went into atrial fib with RVR, ekg was done and confirmed, hr in 140's. Paged K Schorr NP order given for 5 mg of Lopressor, hr now in the 90's I  will continue to monitor.

## 2019-05-01 DIAGNOSIS — L8990 Pressure ulcer of unspecified site, unspecified stage: Secondary | ICD-10-CM | POA: Insufficient documentation

## 2019-05-01 LAB — CBC WITH DIFFERENTIAL/PLATELET
Abs Immature Granulocytes: 0.07 10*3/uL (ref 0.00–0.07)
Basophils Absolute: 0 10*3/uL (ref 0.0–0.1)
Basophils Relative: 0 %
Eosinophils Absolute: 0 10*3/uL (ref 0.0–0.5)
Eosinophils Relative: 0 %
HCT: 40.9 % (ref 36.0–46.0)
Hemoglobin: 12.9 g/dL (ref 12.0–15.0)
Immature Granulocytes: 1 %
Lymphocytes Relative: 7 %
Lymphs Abs: 0.8 10*3/uL (ref 0.7–4.0)
MCH: 31.5 pg (ref 26.0–34.0)
MCHC: 31.5 g/dL (ref 30.0–36.0)
MCV: 99.8 fL (ref 80.0–100.0)
Monocytes Absolute: 1.2 10*3/uL — ABNORMAL HIGH (ref 0.1–1.0)
Monocytes Relative: 10 %
Neutro Abs: 9.8 10*3/uL — ABNORMAL HIGH (ref 1.7–7.7)
Neutrophils Relative %: 82 %
Platelets: 239 10*3/uL (ref 150–400)
RBC: 4.1 MIL/uL (ref 3.87–5.11)
RDW: 13.2 % (ref 11.5–15.5)
WBC: 11.9 10*3/uL — ABNORMAL HIGH (ref 4.0–10.5)
nRBC: 0 % (ref 0.0–0.2)

## 2019-05-01 LAB — BASIC METABOLIC PANEL
Anion gap: 7 (ref 5–15)
BUN: 21 mg/dL (ref 8–23)
CO2: 35 mmol/L — ABNORMAL HIGH (ref 22–32)
Calcium: 8.8 mg/dL — ABNORMAL LOW (ref 8.9–10.3)
Chloride: 98 mmol/L (ref 98–111)
Creatinine, Ser: 0.66 mg/dL (ref 0.44–1.00)
GFR calc Af Amer: >60 mL/min (ref >60)
GFR calc non Af Amer: >60 mL/min (ref >60)
Glucose, Bld: 164 mg/dL — ABNORMAL HIGH (ref 70–99)
Potassium: 3.8 mmol/L (ref 3.5–5.1)
Sodium: 140 mmol/L (ref 135–145)

## 2019-05-01 MED ORDER — POLYETHYLENE GLYCOL 3350 17 G PO PACK
17.0000 g | PACK | Freq: Every day | ORAL | Status: DC
  Administered 2019-05-01 – 2019-05-02 (×2): 17 g via ORAL
  Filled 2019-05-01 (×3): qty 1

## 2019-05-01 MED ORDER — DILTIAZEM HCL 25 MG/5ML IV SOLN
10.0000 mg | Freq: Once | INTRAVENOUS | Status: DC
  Filled 2019-05-01: qty 5

## 2019-05-01 MED ORDER — SENNA 8.6 MG PO TABS
2.0000 | ORAL_TABLET | Freq: Every day | ORAL | Status: DC
  Administered 2019-05-01 – 2019-05-03 (×3): 17.2 mg via ORAL
  Filled 2019-05-01 (×3): qty 2

## 2019-05-01 MED ORDER — METHYLPREDNISOLONE SODIUM SUCC 125 MG IJ SOLR
60.0000 mg | Freq: Two times a day (BID) | INTRAMUSCULAR | Status: DC
  Administered 2019-05-01 – 2019-05-03 (×5): 60 mg via INTRAVENOUS
  Filled 2019-05-01 (×5): qty 2

## 2019-05-01 NOTE — Progress Notes (Signed)
PROGRESS NOTE    Ariel Gonzalez  [id number] DOB: Nov 11, 1940 DOA: 04/26/2019 PCP: Gaynelle Arabian, MD   Brief Narrative:  Patient is a 78 year old female with history of COPD, chronic hypoxia specified on 4 L of oxygen per minute at home, paroxysmal A. fib, hypertension who presented with dyspnea, wheezing, cough.  She was also found to be in A. fib with RVR on presentation.  Patient was admitted for COPD exacerbation, A. fib with RVR .Currently her respiratory status is improving but slow.  Heart rate well controlled.  Assessment & Plan:   Principal Problem:   COPD exacerbation ([place]) Active Problems:   Coronary artery calcification   Hyperlipidemia   Paroxysmal atrial fibrillation (HCC)   Atrial fibrillation with RVR (HCC)   Pressure injury of skin   Acute on chronic hypoxic respiratory failure due to COPD exacerbation: Has history of known COPD, on 4 L of oxygen at home.  Follows with pulmonology as an outpatient.  Continue bronchodilators, steroids.  Respiratory status has improved but slow and she is still short of breath while ambulating minimally.  She just completed course of antibiotics.  Continue chest physical therapy.I changed steroids  to IV again today.  Paroxysmal A. fib/flutter: Heart rate well controlled this morning.  Continue p.o. Cardizem.  Continue anticoagulation with Eliquis  Hypertension: Currently blood pressure stable.  Hyperlipidemia: Continue Lipitor  Anxiety: Required Precedex for severe anxiety/agitation.  Continue as needed alprazolam and is scheduled bupropion, citalopram.  Debility/deconditioning: PT/OT recommended home health.         DVT prophylaxis:eliquis Code Status: Full Family Communication: Called daughter on phone Disposition Plan: Home tomorrow   Consultants: PCCM  Procedures: None  Antimicrobials:  Anti-infectives (From admission, onward)   None      Subjective: Patient seen and examined the bedside this morning.   Hemodynamically stable.  Still has bilateral expiratory wheezes on auscultation.  Feels weak and short of breath on minimal ambulation.  She feels that she is not ready to go home today.  Objective: Vitals:   05/01/19 0400 05/01/19 0600 05/01/19 0759 05/01/19 0800  BP: (!) 115/52 (!) 118/48  (!) 114/51  Pulse: 63 65  (!) 57  Resp: (!) 43 19  20  Temp:    (!) 97.5 F (36.4 C)  TempSrc:    Oral  SpO2: 99% 100% 98% 98%  Weight:      Height:        Intake/Output Summary (Last 24 hours) at 05/01/2019 1050 Last data filed at 05/01/2019 0800 Gross per 24 hour  Intake 250 ml  Output 150 ml  Net 100 ml   Filed Weights   04/26/19 1820 04/27/19 0300  Weight: 65.3 kg 66.1 kg    Examination:  General exam: Not in distress,average built, deconditioned, debilitated HEENT:PERRL,Oral mucosa moist, Ear/Nose normal on gross exam Respiratory system: Bilateral decreased air entry,bilateral expiratory wheezes Cardiovascular system: S1 & S2 heard, RRR. No JVD, murmurs, rubs, gallops or clicks. No pedal edema. Gastrointestinal system: Abdomen is nondistended, soft and nontender. No organomegaly or masses felt. Normal bowel sounds heard. Central nervous system: Alert and oriented. No focal neurological deficits. Extremities: No edema, no clubbing ,no cyanosis, distal peripheral pulses palpable. Skin: No rashes, lesions or ulcers,no icterus ,no pallor    Data Reviewed: I have personally reviewed following labs and imaging studies  CBC: Recent Labs  Lab 04/26/19 1939 04/27/19 0437 04/28/19 0818 04/29/19 0216 04/30/19 0312 05/01/19 0234  WBC 12.4* 7.2 18.5* 15.0* 11.0* 11.9*  NEUTROABS 11.0*  --  17.3* 13.9* 10.0* 9.8*  HGB 13.5 13.2 13.1 11.9* 12.4 12.9  HCT 42.2 41.2 41.6 37.7 38.7 40.9  MCV 100.2* 99.3 101.2* 101.1* 99.7 99.8  PLT 210 188 216 195 215 [id number]   Basic Metabolic Panel: Recent Labs  Lab 04/27/19 0437 04/28/19 0818 04/29/19 0216 04/30/19 0312 05/01/19 0234  NA 139  141 141 140 140  K 4.0 4.3 4.6 4.4 3.8  CL 102 101 101 100 98  CO2 25 30 31  35* 35*  GLUCOSE 150* 165* 144* 141* 164*  BUN 18 25* 19 21 21   CREATININE 0.80 0.75 0.66 0.67 0.66  CALCIUM 8.7* 9.2 8.9 8.9 8.8*   GFR: Estimated Creatinine Clearance: 54.3 mL/min (by C-G formula based on SCr of 0.66 mg/dL). Liver Function Tests: Recent Labs  Lab 04/26/19 1939  AST 31  ALT 26  ALKPHOS 75  BILITOT 1.3*  PROT 6.7  ALBUMIN 3.9   No results for input(s): LIPASE, AMYLASE in the last 168 hours. No results for input(s): AMMONIA in the last 168 hours. Coagulation Profile: No results for input(s): INR, PROTIME in the last 168 hours. Cardiac Enzymes: No results for input(s): CKTOTAL, CKMB, CKMBINDEX, TROPONINI in the last 168 hours. BNP (last 3 results) No results for input(s): PROBNP in the last 8760 hours. HbA1C: No results for input(s): HGBA1C in the last 72 hours. CBG: Recent Labs  Lab 04/28/19 0023 04/28/19 0358 04/28/19 0733 04/28/19 1141 04/28/19 1954  GLUCAP 137* 112* 143* 161* 164*   Lipid Profile: No results for input(s): CHOL, HDL, LDLCALC, TRIG, CHOLHDL, LDLDIRECT in the last 72 hours. Thyroid Function Tests: Recent Labs    04/29/19 1605  FREET4 0.88   Anemia Panel: No results for input(s): VITAMINB12, FOLATE, FERRITIN, TIBC, IRON, RETICCTPCT in the last 72 hours. Sepsis Labs: No results for input(s): PROCALCITON, LATICACIDVEN in the last 168 hours.  Recent Results (from the past 240 hour(s))  SARS CORONAVIRUS 2 (TAT 6-24 HRS) Nasopharyngeal Nasopharyngeal Swab     Status: None   Collection Time: 04/26/19  7:39 PM   Specimen: Nasopharyngeal Swab  Result Value Ref Range Status   SARS Coronavirus 2 NEGATIVE NEGATIVE Final    Comment: (NOTE) SARS-CoV-2 target nucleic acids are NOT DETECTED. The SARS-CoV-2 RNA is generally detectable in upper and lower respiratory specimens during the acute phase of infection. Negative results do not preclude SARS-CoV-2  infection, do not rule out co-infections with other pathogens, and should not be used as the sole basis for treatment or other patient management decisions. Negative results must be combined with clinical observations, patient history, and epidemiological information. The expected result is Negative. Fact Sheet for Patients: SugarRoll.be Fact Sheet for Healthcare Providers: https://www.woods-mathews.com/ This test is not yet approved or cleared by the [place] FDA and  has been authorized for detection and/or diagnosis of SARS-CoV-2 by FDA under an Emergency Use Authorization (EUA). This EUA will remain  in effect (meaning this test can be used) for the duration of the COVID-19 declaration under Section 56 4(b)(1) of the Act, 21 U.S.C. section 360bbb-3(b)(1), unless the authorization is terminated or revoked sooner. Performed at [facility], [place] [address], [place], [place] [address]   MRSA PCR Screening     Status: None   Collection Time: 04/27/19  3:40 AM  Result Value Ref Range Status   MRSA by PCR NEGATIVE NEGATIVE Final    Comment:        The GeneXpert MRSA Assay (FDA approved for NASAL specimens only), is one  component of a comprehensive MRSA colonization surveillance program. It is not intended to diagnose MRSA infection nor to guide or monitor treatment for MRSA infections. Performed at [facility], [place] [address] [place], [place] [address]          Radiology Studies: No results found.      Scheduled Meds: . apixaban  5 mg Oral BID  . atorvastatin  40 mg Oral q1800  . budesonide (PULMICORT) nebulizer solution  0.25 mg Nebulization BID  . buPROPion  150 mg Oral Daily  . calcium carbonate  1,250 mg Oral Daily  . Chlorhexidine Gluconate Cloth  6 each Topical Daily  . citalopram  40 mg Oral Daily  . clonazePAM  1 mg Oral BID  . diltiazem  180 mg Oral Daily  . ipratropium-albuterol   3 mL Nebulization Q4H  . mouth rinse  15 mL Mouth Rinse BID  . methylPREDNISolone (SOLU-MEDROL) injection  60 mg Intravenous Q12H  . montelukast  10 mg Oral QHS  . multivitamin with minerals  1 tablet Oral Daily   Continuous Infusions: . dexmedetomidine (PRECEDEX) IV infusion Stopped (04/28/19 0217)     LOS: 4 days    Time spent: 25 mins.More than 50% of that time was spent in counseling and/or coordination of care.      Shelly Coss, MD [facility] Pager [phone]  If 7PM-7AM, please contact night-coverage [email or web address] Password [email or web address] 05/01/2019, 10:50 AM

## 2019-05-01 NOTE — Progress Notes (Signed)
10mg  Cardizem push not given, patient converted back to sinus rhythm with frequent PACs, confirmed with central tele.   Will continue to monitor.

## 2019-05-01 NOTE — Progress Notes (Signed)
New orders received from MD to administer 10mg  cardizem IV push once if HR sustains greater than 120 for a significant amount of time. Patient's HR currently 110s.  Will continue to monitor.

## 2019-05-01 NOTE — Evaluation (Signed)
Occupational Therapy Evaluation Patient Details Name: Ariel Gonzalez MRN: [id number] DOB: 26-Jun-1941 Today's Date: 05/01/2019    History of Present Illness 78 year old female who presented with dyspnea. Dx of COPD exacerbation.  PMH of COPD with chronic hypoxic respiratory failure, uses 4 L of supplemental oxygen per nasal cannula at home. She also has hypertension and paroxysmal atrial fibrillation   Clinical Impression   Pt admitted with dyspnea. Pt currently with functional limitations due to the deficits listed below (see OT Problem List).  Pt will benefit from skilled OT to increase their safety and independence with ADL and functional mobility for ADL to facilitate discharge to venue listed below.   Pt has very supportive family.  Pt very willing to participate with OT but fatigued quickly.    Follow Up Recommendations  Supervision/Assistance - 24 hour    Equipment Recommendations  None recommended by OT    Recommendations for Other Services       Precautions / Restrictions Precautions Precautions: Fall Precaution Comments: pt denies h/o falls in past 1 year Restrictions Weight Bearing Restrictions: No      Mobility Bed Mobility Overal bed mobility: Modified Independent             General bed mobility comments: HOB up, used bedrails  Transfers Overall transfer level: Needs assistance Equipment used: Rolling walker (2 wheeled) Transfers: Sit to/from Stand Sit to Stand: Min guard         General transfer comment: Min guard for safety, sit to stand x2 during session from bed and from recliner.    Balance Overall balance assessment: Needs assistance   Sitting balance-Leahy Scale: Good     Standing balance support: Single extremity supported Standing balance-Leahy Scale: Poor Standing balance comment: requires single UE support                           ADL either performed or assessed with clinical judgement   ADL Overall ADL's : Needs  assistance/impaired     Grooming: Wash/dry face;Brushing hair;Minimal assistance;Sitting                   Toilet Transfer: Minimal assistance;+2 for safety/equipment;Cueing for safety;Stand-pivot   Toileting- Clothing Manipulation and Hygiene: Maximal assistance;Sit to/from stand;Cueing for compensatory techniques;Cueing for safety;+2 for safety/equipment         General ADL Comments: Pt fatigues quickly and needs VC to stop talking and take deep breathes. Pts daugther present and will A pt as needed.  Discussed energy conservation strateiges with pt and family. Will continue to provide education                  Pertinent Vitals/Pain Pain Assessment: No/denies pain     Hand Dominance     Extremity/Trunk Assessment         Cervical / Trunk Assessment Cervical / Trunk Assessment: Normal   Communication Communication Communication: No difficulties   Cognition Arousal/Alertness: Awake/alert Behavior During Therapy: WFL for tasks assessed/performed Overall Cognitive Status: Within Functional Limits for tasks assessed                                     General Comments  HRmax 150 bpm, developed afib rhythm during ambulation. SpO2 85% with exertion, returning to >90% on 4LO2 when instructed in breathing technique (in through nose, out through mouth)    Exercises Other Exercises Other  Exercises: instructed in diaphragmatic breathing, with hand on abdomen for external feedback. Other Exercises: discussed energy conservation, framed as prioritizing pt day at home to maintain sats and energy throughout the day.   Shoulder Instructions      Home Living Family/patient expects to be discharged to:: Private residence Living Arrangements: Alone Available Help at Discharge: Family;Available PRN/intermittently Type of Home: House Home Access: Level entry     Home Layout: One level     Bathroom Shower/Tub: Occupational psychologist:  Handicapped height     Home Equipment: Shower seat          Prior Functioning/Environment Level of Independence: Independent        Comments: walks short distances only at home, daughter lives nearby and does shopping, daughter can assist as needed as she is on leave from work        OT Problem List: Decreased activity tolerance;Cardiopulmonary status limiting activity      OT Treatment/Interventions: Self-care/ADL training;Patient/family education;Energy conservation;Therapeutic activities    OT Goals(Current goals can be found in the care plan section) Acute Rehab OT Goals Patient Stated Goal: DC home OT Goal Formulation: With patient Time For Goal Achievement: 05/08/19 Potential to Achieve Goals: Good ADL Goals Pt Will Transfer to Toilet: with modified independence;regular height toilet;ambulating Pt Will Perform Toileting - Clothing Manipulation and hygiene: with modified independence;sit to/from stand Pt Will Perform Tub/Shower Transfer: with supervision;shower seat;grab bars;with caregiver independent in assisting Additional ADL Goal #1: Pt will verbalize and apply energy conservation strategies to ADL activity at mod I level  OT Frequency: Min 2X/week   Barriers to D/C:            Co-evaluation   Reason for Co-Treatment: For patient/therapist safety PT goals addressed during session: Mobility/safety with mobility OT goals addressed during session: ADL's and self-care      AM-PAC OT "6 Clicks" Daily Activity     Outcome Measure Help from another person eating meals?: None Help from another person taking care of personal grooming?: A Little Help from another person toileting, which includes using toliet, bedpan, or urinal?: A Little Help from another person bathing (including washing, rinsing, drying)?: A Lot Help from another person to put on and taking off regular upper body clothing?: A Little Help from another person to put on and taking off regular  lower body clothing?: A Lot 6 Click Score: 17   End of Session Equipment Utilized During Treatment: Oxygen Nurse Communication: Mobility status  Activity Tolerance: Patient limited by fatigue Patient left: in chair;with call bell/phone within reach;with family/visitor present  OT Visit Diagnosis: Muscle weakness (generalized) (M62.81)                Time: 0224-0300 OT Time Calculation (min): 36 min Charges:  OT General Charges $OT Visit: 1 Visit OT Evaluation $OT Eval Moderate Complexity: 1 Mod  Kari Baars, OT Acute Rehabilitation Services [phone] Office- [phone]     Jessika Rothery, Edwena Felty D 05/01/2019, 4:29 PM

## 2019-05-01 NOTE — Progress Notes (Signed)
Physical Therapy Treatment Patient Details Name: Ariel Gonzalez MRN: [id number] DOB: July 14, 1940 Today's Date: 05/01/2019    History of Present Illness 77 year old female who presented with dyspnea. Dx of COPD exacerbation.  PMH of COPD with chronic hypoxic respiratory failure, uses 4 L of supplemental oxygen per nasal cannula at home. She also has hypertension and paroxysmal atrial fibrillation    PT Comments    Pt with improved tolerance for activity today, but requires increased time for rest breaks and instruction in breathing technique to recover sats to >90% (85-94% on 4LO2). Pt developed afib rhythm during ambulation, with HR up to 150 bpm. RN notified. PT to continue to follow acutely.    Follow Up Recommendations  Home health PT;Supervision for mobility/OOB     Equipment Recommendations  Rolling walker with 5" wheels(4 wheeled RW with seat)    Recommendations for Other Services       Precautions / Restrictions Precautions Precautions: Fall Precaution Comments: pt denies h/o falls in past 1 year Restrictions Weight Bearing Restrictions: No    Mobility  Bed Mobility Overal bed mobility: Modified Independent             General bed mobility comments: HOB up, used bedrails  Transfers Overall transfer level: Needs assistance Equipment used: Rolling walker (2 wheeled) Transfers: Sit to/from Stand Sit to Stand: Min guard         General transfer comment: Min guard for safety, sit to stand x2 during session from bed and from recliner.  Ambulation/Gait Ambulation/Gait assistance: Min assist;+2 safety/equipment Gait Distance (Feet): 15 Feet(5 ft + 15 ft) Assistive device: 1 person hand held assist Gait Pattern/deviations: Step-through pattern;Decreased stride length;Shuffle Gait velocity: decr   General Gait Details: Min assist for steadying, +2 for lines/leads. Verbal cuing taking her time as pt tries to rush and becomes increasingly unsteady.   Stairs              Wheelchair Mobility    Modified Rankin (Stroke Patients Only)       Balance Overall balance assessment: Needs assistance   Sitting balance-Leahy Scale: Good     Standing balance support: Single extremity supported Standing balance-Leahy Scale: Poor Standing balance comment: requires single UE support                            Cognition Arousal/Alertness: Awake/alert Behavior During Therapy: WFL for tasks assessed/performed Overall Cognitive Status: Within Functional Limits for tasks assessed                                        Exercises Other Exercises Other Exercises: instructed in diaphragmatic breathing, with hand on abdomen for external feedback. Other Exercises: discussed energy conservation, framed as prioritizing pt day at home to maintain sats and energy throughout the day.    General Comments General comments (skin integrity, edema, etc.): HRmax 150 bpm, developed afib rhythm during ambulation. SpO2 85% with exertion, returning to >90% on 4LO2 when instructed in breathing technique (in through nose, out through mouth)      Pertinent Vitals/Pain Pain Assessment: No/denies pain    Home Living                      Prior Function            PT Goals (current goals can now be found in  the care plan section) Acute Rehab PT Goals Patient Stated Goal: DC home PT Goal Formulation: With patient Time For Goal Achievement: 05/13/19 Potential to Achieve Goals: Good Progress towards PT goals: Progressing toward goals    Frequency    Min 3X/week      PT Plan Current plan remains appropriate    Co-evaluation PT/OT/SLP Co-Evaluation/Treatment: Yes Reason for Co-Treatment: For patient/therapist safety PT goals addressed during session: Mobility/safety with mobility OT goals addressed during session: ADL's and self-care      AM-PAC PT "6 Clicks" Mobility   Outcome Measure  Help needed turning from  your back to your side while in a flat bed without using bedrails?: A Little Help needed moving from lying on your back to sitting on the side of a flat bed without using bedrails?: A Little Help needed moving to and from a bed to a chair (including a wheelchair)?: A Little Help needed standing up from a chair using your arms (e.g., wheelchair or bedside chair)?: A Little Help needed to walk in hospital room?: A Little Help needed climbing 3-5 steps with a railing? : A Lot 6 Click Score: 17    End of Session Equipment Utilized During Treatment: Gait belt;Oxygen Activity Tolerance: No increased pain;Treatment limited secondary to medical complications (Comment) Patient left: in chair;with call bell/phone within reach;with family/visitor present(pt instructed to press call button and wait for assist prior to mobilizing to bathroom) Nurse Communication: Mobility status PT Visit Diagnosis: Unsteadiness on feet (R26.81);Difficulty in walking, not elsewhere classified (R26.2)     Time: [id number] PT Time Calculation (min) (ACUTE ONLY): 45 min  Charges:  $Gait Training: 8-22 mins                    Julien Girt, PT Acute Rehabilitation Services Pager [phone]  Office [phone]   Jaykob Minichiello D [place] 05/01/2019, 4:03 PM

## 2019-05-01 NOTE — Progress Notes (Signed)
PT demonstrated hands on understanding of Flutter device. PC at this time. 

## 2019-05-01 NOTE — Progress Notes (Signed)
Patient back in A. Fib. HR sustaining 120s-140s after working with PT. Patient's vital signs currently stable, BP 139/58, O2 sats 92% on 4L [place]. Patient states she does not "feel a thing." MD made aware.   Will continue to monitor.

## 2019-05-01 NOTE — TOC Initial Note (Addendum)
Transition of Care [facility]) - Initial/Assessment Note    Patient Details  Name: Ariel Gonzalez MRN: [id number] Date of Birth: 1940/10/28  Transition of Care [facility]) CM/SW Contact:    Lia Hopping, [place] Phone Number: 05/01/2019, 11:16 AM  Clinical Narrative:       Patient admitted for COPD exacerbation. Patient wears 4 L of oxygen at home/ arranged through [facility].  CSW  explain role and reason for visit- to assist with disposition planning.PT recommending home health.      PT evaluation complete and home health recommended. Patient is not certain if she can walk. She reports she has not been out of bed to walk.  Daughter eventually joined the conversation and express about the patient going home. She has observe the patient need assistance with standing to use the bedside commode. Patient reports she lives at home alone and is usually ambulatory without assistive device. She reports she is fairly independent with her ADL's but easily looses breath while bathing and dressing. Patient reports since the pandemic started she has not been out her home because she is considered high risk. Patient reports one of her daughters live 3 minutes away from her home, and her children take turns assisting with care and other household chores. Patient and daughter open to rehab if suggested by Physical Therapy team.  [place] notified physician.  TOC team will continue to assist with discharge planning.     Expected Discharge Plan: [place] Barriers to Discharge: Continued Medical Work up, PT/OT session to determine appropriate discharge plan.    Patient Goals and CMS Choice   CMS Medicare.gov Compare Post Acute Care list provided to:: Patient    Expected Discharge Plan and Services Expected Discharge Plan: [place] In-house Referral: Clinical Social Work Discharge Planning Services: CM Consult Post Acute Care Choice: Durable Medical Equipment, [place] Living arrangements for the past 2 months: Single Family Home                                      Prior Living Arrangements/Services Living arrangements for the past 2 months: Single Family Home Lives with:: Self Patient language and need for interpreter reviewed:: No Do you feel safe going back to the place where you live?: Yes      Need for Family Participation in Patient Care: Yes (Comment) Care giver support system in place?: Yes (comment)   Criminal Activity/Legal Involvement Pertinent to Current Situation/Hospitalization: No - Comment as needed  Activities of Daily Living Home Assistive Devices/Equipment: None ADL Screening (condition at time of admission) Patient's cognitive ability adequate to safely complete daily activities?: Yes Is the patient deaf or have difficulty hearing?: No Does the patient have difficulty seeing, even when wearing glasses/contacts?: Yes Does the patient have difficulty concentrating, remembering, or making decisions?: No Patient able to express need for assistance with ADLs?: Yes Does the patient have difficulty dressing or bathing?: No Independently performs ADLs?: Yes (appropriate for developmental age) Does the patient have difficulty walking or climbing stairs?: No Weakness of Legs: None Weakness of Arms/Hands: None  Permission Sought/Granted Permission sought to share information with : Case Manager, Customer service manager Permission granted to share information with : Yes, Verbal Permission Granted        Permission granted to share info w Relationship: Daughter     Emotional Assessment Appearance:: Appears stated age  Attitude/Demeanor/Rapport: Engaged Affect (typically observed): Accepting Orientation: : Oriented to Self, Oriented to Place, Oriented to  Time, Oriented to Situation Alcohol / Substance Use: Not Applicable Psych Involvement: No (comment)  Admission diagnosis:  COPD exacerbation (HCC)  [J44.1] Atrial fibrillation with RVR ([place]) [I48.91] Patient Active Problem List   Diagnosis Date Noted  . Pressure injury of skin 05/01/2019  . COPD exacerbation ([place]) 04/26/2019  . Atrial fibrillation with RVR ([place]) 04/26/2019  . Paroxysmal atrial fibrillation ([place]) 04/14/2019  . Coronary artery calcification 10/02/2015  . Hyperlipidemia 10/02/2015  . Cough 06/13/2015  . Allergic rhinitis 10/05/2012  . UPPER RESPIRATORY INFECTION 08/28/2010  . Chronic respiratory failure with hypoxia ([place]) 07/17/2010  . COPD (chronic obstructive pulmonary disease) ([place]) 04/06/2007   PCP:  Gaynelle Arabian, MD Pharmacy:   [place], [place] - [address] [place] [address] Phone: [phone] Fax: [facility] - [place], [place] [address] Phone: [phone] Fax: [phone]     Social Determinants of Health (SDOH) Interventions    Readmission Risk Interventions No flowsheet data found.

## 2019-05-02 LAB — CBC WITH DIFFERENTIAL/PLATELET
Abs Immature Granulocytes: 0.13 10*3/uL — ABNORMAL HIGH (ref 0.00–0.07)
Basophils Absolute: 0 10*3/uL (ref 0.0–0.1)
Basophils Relative: 0 %
Eosinophils Absolute: 0 10*3/uL (ref 0.0–0.5)
Eosinophils Relative: 0 %
HCT: 40.6 % (ref 36.0–46.0)
Hemoglobin: 12.9 g/dL (ref 12.0–15.0)
Immature Granulocytes: 1 %
Lymphocytes Relative: 3 %
Lymphs Abs: 0.3 10*3/uL — ABNORMAL LOW (ref 0.7–4.0)
MCH: 31.5 pg (ref 26.0–34.0)
MCHC: 31.8 g/dL (ref 30.0–36.0)
MCV: 99 fL (ref 80.0–100.0)
Monocytes Absolute: 0.3 10*3/uL (ref 0.1–1.0)
Monocytes Relative: 3 %
Neutro Abs: 8.6 10*3/uL — ABNORMAL HIGH (ref 1.7–7.7)
Neutrophils Relative %: 93 %
Platelets: 246 10*3/uL (ref 150–400)
RBC: 4.1 MIL/uL (ref 3.87–5.11)
RDW: 12.7 % (ref 11.5–15.5)
WBC: 9.4 10*3/uL (ref 4.0–10.5)
nRBC: 0 % (ref 0.0–0.2)

## 2019-05-02 LAB — BASIC METABOLIC PANEL
Anion gap: 6 (ref 5–15)
BUN: 20 mg/dL (ref 8–23)
CO2: 36 mmol/L — ABNORMAL HIGH (ref 22–32)
Calcium: 8.4 mg/dL — ABNORMAL LOW (ref 8.9–10.3)
Chloride: 97 mmol/L — ABNORMAL LOW (ref 98–111)
Creatinine, Ser: 0.61 mg/dL (ref 0.44–1.00)
GFR calc Af Amer: >60 mL/min (ref >60)
GFR calc non Af Amer: >60 mL/min (ref >60)
Glucose, Bld: 179 mg/dL — ABNORMAL HIGH (ref 70–99)
Potassium: 4.2 mmol/L (ref 3.5–5.1)
Sodium: 139 mmol/L (ref 135–145)

## 2019-05-02 NOTE — TOC Progression Note (Signed)
Transition of Care [facility]) - Progression Note    Patient Details  Name: Ariel Gonzalez MRN: [id number] Date of Birth: [birth year]  Transition of Care [facility]) CM/SW [place], [place] Phone Number: 05/02/2019, 1:32 PM  Clinical Narrative:   [facility], asked for aide as well.  Spoke to [place] at [facility] who accepted for services.  Messaged Dr about additional orders.  Called Zack with [employer] for RW.    Expected Discharge Plan: [place] Barriers to Discharge: Continued Medical Work up  Expected Discharge Plan and Services Expected Discharge Plan: [place] In-house Referral: Clinical Social Work Discharge Planning Services: CM Consult Post Acute Care Choice: [place], [place] Living arrangements for the past 2 months: Single Family Home                                       Social Determinants of Health (SDOH) Interventions    Readmission Risk Interventions No flowsheet data found.

## 2019-05-02 NOTE — Progress Notes (Signed)
After getting out of bed into the chair, patient converted from NSR to A.Fib RVR, HR sustaining 130s-150s, similar scenario as on 05/01/2019 2:57pm. Patient asymptomatic. BP 160/93, O2 sats 91% on 4L [place].  MD made aware.  Will continue to monitor.

## 2019-05-02 NOTE — Progress Notes (Signed)
PROGRESS NOTE    Ariel Gonzalez  [id number] DOB: October 05, 1940 DOA: 04/26/2019 PCP: Gaynelle Arabian, MD   Brief Narrative:  Patient is a 78 year old female with history of COPD, chronic hypoxia specified on 4 L of oxygen per minute at home, paroxysmal A. fib, hypertension who presented with dyspnea, wheezing, cough.  She was also found to be in A. fib with RVR on presentation.  Patient was admitted for COPD exacerbation, A. fib with RVR .Currently her respiratory status is improving but very  slow.  She still looks very exhausted, short of breath.  Plan is to continue bronchodilators, IV steroids  Assessment & Plan:   Principal Problem:   COPD exacerbation (HCC) Active Problems:   Coronary artery calcification   Hyperlipidemia   Paroxysmal atrial fibrillation (HCC)   Atrial fibrillation with RVR (HCC)   Pressure injury of skin   Acute on chronic hypoxic respiratory failure due to COPD exacerbation: Has history of known COPD, on 4 L of oxygen at home.  Follows with pulmonology as an outpatient.  Continue bronchodilators, steroids.  Respiratory status has improved but very slow and she is still short of breath while ambulating minimally.  She just completed course of antibiotics.  Continue chest physical therapy.Continue IV steroids.  Paroxysmal A. fib/flutter: Heart rate well controlled this morning.  Continue p.o. Cardizem.  Continue anticoagulation with Eliquis  Hypertension: Currently blood pressure stable.  Hyperlipidemia: Continue Lipitor  Anxiety: Required Precedex for severe anxiety/agitation.  Continue as needed alprazolam and is scheduled bupropion, citalopram.  Debility/deconditioning: PT/OT recommended home health.         DVT prophylaxis:eliquis Code Status: Full Family Communication: Called daughter on phone on 05/01/19 Disposition Plan: Home tomorrow   Consultants: PCCM  Procedures: None  Antimicrobials:  Anti-infectives (From admission, onward)   None       Subjective: Patient seen and examined the bedside this morning.  Hemodynamically stable.  Looks very weak.  Auscultation did not reveal crackles or wheezes but she is clearly short of breath.  Not stable for discharge.  Objective: Vitals:   05/02/19 0600 05/02/19 0732 05/02/19 0800 05/02/19 1000  BP: (!) 110/48  135/75 137/79  Pulse: 63  74 73  Resp: 20  15 16   Temp:   (!) 97.4 F (36.3 C)   TempSrc:   Axillary   SpO2: 96% 94% 96% 96%  Weight:      Height:        Intake/Output Summary (Last 24 hours) at 05/02/2019 1156 Last data filed at 05/02/2019 0800 Gross per 24 hour  Intake 480 ml  Output 1300 ml  Net -820 ml   Filed Weights   04/26/19 1820 04/27/19 0300  Weight: 65.3 kg 66.1 kg    Examination:  General exam: average built, deconditioned, debilitated HEENT:PERRL,Oral mucosa moist, Ear/Nose normal on gross exam Respiratory system: Bilateral decreased air entry Cardiovascular system: S1 & S2 heard, RRR. No JVD, murmurs, rubs, gallops or clicks. No pedal edema. Gastrointestinal system: Abdomen is nondistended, soft and nontender. No organomegaly or masses felt. Normal bowel sounds heard. Central nervous system: Alert and oriented. No focal neurological deficits. Extremities: No edema, no clubbing ,no cyanosis, distal peripheral pulses palpable. Skin: No rashes, lesions or ulcers,no icterus ,no pallor    Data Reviewed: I have personally reviewed following labs and imaging studies  CBC: Recent Labs  Lab 04/28/19 0818 04/29/19 0216 04/30/19 0312 05/01/19 0234 05/02/19 0226  WBC 18.5* 15.0* 11.0* 11.9* 9.4  NEUTROABS 17.3* 13.9* 10.0* 9.8* 8.6*  HGB 13.1 11.9* 12.4 12.9 12.9  HCT 41.6 37.7 38.7 40.9 40.6  MCV 101.2* 101.1* 99.7 99.8 99.0  PLT 216 195 215 239 0000000   Basic Metabolic Panel: Recent Labs  Lab 04/28/19 0818 04/29/19 0216 04/30/19 0312 05/01/19 0234 05/02/19 0226  NA 141 141 140 140 139  K 4.3 4.6 4.4 3.8 4.2  CL 101 101 100 98 97*   CO2 30 31 35* 35* 36*  GLUCOSE 165* 144* 141* 164* 179*  BUN 25* 19 21 21 20   CREATININE 0.75 0.66 0.67 0.66 0.61  CALCIUM 9.2 8.9 8.9 8.8* 8.4*   GFR: Estimated Creatinine Clearance: 54.3 mL/min (by C-G formula based on SCr of 0.61 mg/dL). Liver Function Tests: Recent Labs  Lab 04/26/19 1939  AST 31  ALT 26  ALKPHOS 75  BILITOT 1.3*  PROT 6.7  ALBUMIN 3.9   No results for input(s): LIPASE, AMYLASE in the last 168 hours. No results for input(s): AMMONIA in the last 168 hours. Coagulation Profile: No results for input(s): INR, PROTIME in the last 168 hours. Cardiac Enzymes: No results for input(s): CKTOTAL, CKMB, CKMBINDEX, TROPONINI in the last 168 hours. BNP (last 3 results) No results for input(s): PROBNP in the last 8760 hours. HbA1C: No results for input(s): HGBA1C in the last 72 hours. CBG: Recent Labs  Lab 04/28/19 0023 04/28/19 0358 04/28/19 0733 04/28/19 1141 04/28/19 1954  GLUCAP 137* 112* 143* 161* 164*   Lipid Profile: No results for input(s): CHOL, HDL, LDLCALC, TRIG, CHOLHDL, LDLDIRECT in the last 72 hours. Thyroid Function Tests: Recent Labs    04/29/19 1605  FREET4 0.88   Anemia Panel: No results for input(s): VITAMINB12, FOLATE, FERRITIN, TIBC, IRON, RETICCTPCT in the last 72 hours. Sepsis Labs: No results for input(s): PROCALCITON, LATICACIDVEN in the last 168 hours.  Recent Results (from the past 240 hour(s))  SARS CORONAVIRUS 2 (TAT 6-24 HRS) Nasopharyngeal Nasopharyngeal Swab     Status: None   Collection Time: 04/26/19  7:39 PM   Specimen: Nasopharyngeal Swab  Result Value Ref Range Status   SARS Coronavirus 2 NEGATIVE NEGATIVE Final    Comment: (NOTE) SARS-CoV-2 target nucleic acids are NOT DETECTED. The SARS-CoV-2 RNA is generally detectable in upper and lower respiratory specimens during the acute phase of infection. Negative results do not preclude SARS-CoV-2 infection, do not rule out co-infections with other pathogens, and  should not be used as the sole basis for treatment or other patient management decisions. Negative results must be combined with clinical observations, patient history, and epidemiological information. The expected result is Negative. Fact Sheet for Patients: SugarRoll.be Fact Sheet for Healthcare Providers: https://www.woods-mathews.com/ This test is not yet approved or cleared by the [place] FDA and  has been authorized for detection and/or diagnosis of SARS-CoV-2 by FDA under an Emergency Use Authorization (EUA). This EUA will remain  in effect (meaning this test can be used) for the duration of the COVID-19 declaration under Section 56 4(b)(1) of the Act, 21 U.S.C. section 360bbb-3(b)(1), unless the authorization is terminated or revoked sooner. Performed at [facility], [place] [address] [place], [place] [address]   MRSA PCR Screening     Status: None   Collection Time: 04/27/19  3:40 AM  Result Value Ref Range Status   MRSA by PCR NEGATIVE NEGATIVE Final    Comment:        The GeneXpert MRSA Assay (FDA approved for NASAL specimens only), is one component of a comprehensive MRSA colonization surveillance program. It  is not intended to diagnose MRSA infection nor to guide or monitor treatment for MRSA infections. Performed at [facility], [place] [address] [place], [place] [address]          Radiology Studies: No results found.      Scheduled Meds: . apixaban  5 mg Oral BID  . atorvastatin  40 mg Oral q1800  . budesonide (PULMICORT) nebulizer solution  0.25 mg Nebulization BID  . buPROPion  150 mg Oral Daily  . calcium carbonate  1,250 mg Oral Daily  . Chlorhexidine Gluconate Cloth  6 each Topical Daily  . citalopram  40 mg Oral Daily  . clonazePAM  1 mg Oral BID  . diltiazem  180 mg Oral Daily  . diltiazem  10 mg Intravenous Once  . ipratropium-albuterol  3 mL Nebulization Q4H  .  mouth rinse  15 mL Mouth Rinse BID  . methylPREDNISolone (SOLU-MEDROL) injection  60 mg Intravenous Q12H  . montelukast  10 mg Oral QHS  . multivitamin with minerals  1 tablet Oral Daily  . polyethylene glycol  17 g Oral Daily  . senna  2 tablet Oral Daily   Continuous Infusions: . dexmedetomidine (PRECEDEX) IV infusion Stopped (04/28/19 0217)     LOS: 5 days    Time spent: 25 mins.More than 50% of that time was spent in counseling and/or coordination of care.      Shelly Coss, MD [facility] Pager [phone]  If 7PM-7AM, please contact night-coverage [email or web address] Password [email or web address] 05/02/2019, 11:56 AM

## 2019-05-03 MED ORDER — PREDNISONE 10 MG PO TABS: ORAL_TABLET | ORAL | 0 refills | Status: DC

## 2019-05-03 MED ORDER — APIXABAN 5 MG PO TABS: 5.0000 mg | ORAL_TABLET | Freq: Two times a day (BID) | ORAL | 0 refills | Status: DC

## 2019-05-03 NOTE — Discharge Summary (Signed)
Physician Discharge Summary  Ariel Gonzalez [id number] DOB: July 04, 1941 DOA: 04/26/2019  PCP: Gaynelle Arabian, MD  Admit date: 04/26/2019 Discharge date: 05/03/2019  Admitted From: Home Disposition:  Home  Discharge Condition:Stable CODE STATUS:FULL Diet recommendation: Heart Healthy  Brief/Interim Summary:  Patient is a 78 year old female with history of COPD, chronic hypoxia specified on 4 L of oxygen per minute at home, paroxysmal A. fib, hypertension who presented with dyspnea, wheezing, cough.  She was also found to be in A. fib with RVR on presentation.  Patient was admitted for COPD exacerbation, A. fib with RVR .  She was started on bronchodilators and steroids.  Her respiratory status improved very slowly.  Today she feels much better.  She was seen by PT/OT and recommended home health on discharge.  She is hemodynamically stable for discharge to home.  She needs to continue prolonged steroid taper.  She needs  to follow-up with her pulmonologist in 2 weeks.  Following problems were addressed during her hospitalization:  Acute on chronic hypoxic respiratory failure due to COPD exacerbation: Has history of known COPD, on 4 L of oxygen at home. Follows with pulmonology as an outpatient.  Continue bronchodilators, steroids.  Respiratory status has improved but very slow.  She just completed course of antibiotics. Continue prolonged steroid taper.  Paroxysmal A. fib/flutter: Heart rate well controlled this morning.  Continue p.o. Cardizem.  Continue anticoagulation with Eliquis  Hypertension: Currently blood pressure stable.  Hyperlipidemia: Continue Lipitor  Anxiety: Required Precedex for severe anxiety/agitation.  Continue as needed benzos and is scheduled bupropion, citalopram.  Debility/deconditioning: PT/OT recommended home health.    Discharge Diagnoses:  Principal Problem:   COPD exacerbation ([place]) Active Problems:   Coronary artery calcification    Hyperlipidemia   Paroxysmal atrial fibrillation (HCC)   Atrial fibrillation with RVR (HCC)   Pressure injury of skin    Discharge Instructions  Discharge Instructions    Diet - low sodium heart healthy   Complete by: As directed    Discharge instructions   Complete by: As directed    1)Please follow up with your PCP in a week. 2)Follow up with your pulmonologist in 2 weeks. 3)Take prescribed  medications as instructed.   Increase activity slowly   Complete by: As directed      Allergies as of 05/03/2019      Reactions   Cefaclor    rash   Clarithromycin    rash   Erythromycin    rash   Iodine    rash      Medication List    STOP taking these medications   doxycycline 100 MG tablet Commonly known as: VIBRA-TABS     TAKE these medications   ALPRAZolam 0.5 MG tablet Commonly known as: XANAX Take 0.5 mg by mouth 3 (three) times daily as needed for anxiety or sleep. 1 every 6 hours as needed   amLODipine 5 MG tablet Commonly known as: NORVASC Take 5 mg by mouth daily.   apixaban 5 MG Tabs tablet Commonly known as: ELIQUIS Take 1 tablet (5 mg total) by mouth 2 (two) times daily.   atorvastatin 40 MG tablet Commonly known as: LIPITOR TAKE 1 TABLET BY MOUTH ONCE DAILY   buPROPion 150 MG 24 hr tablet Commonly known as: WELLBUTRIN XL Take 1 tablet by mouth daily.   calcium carbonate 600 MG Tabs tablet Commonly known as: OS-CAL Take 600 mg by mouth daily.   citalopram 20 MG tablet Commonly known as: CELEXA Take 40 mg  by mouth daily.   clonazePAM 1 MG tablet Commonly known as: KLONOPIN Half tablet to whole tablet twice a day for anxiety   diltiazem 120 MG 24 hr capsule Commonly known as: Cardizem CD Take 1 capsule (120 mg total) by mouth daily.   montelukast 10 MG tablet Commonly known as: SINGULAIR Take 1 tablet (10 mg total) by mouth at bedtime.   multivitamin tablet Take 1 tablet by mouth daily.   OXYGEN Inhale 3.5 L into the lungs  continuous.   PA Vitamin D-3 50 MCG (2000 UT) Caps Generic drug: Cholecalciferol Take 2,000 Units by mouth daily.   predniSONE 10 MG tablet Commonly known as: DELTASONE Take 4 pills for 3 days then 3 pills for 3 days then 2 pills for 3 days then 1 pill for 3 days then stop What changed: additional instructions   Stiolto Respimat 2.5-2.5 MCG/ACT Aers Generic drug: Tiotropium Bromide-Olodaterol Inhale 2 puffs into the lungs daily.   Ventolin HFA 108 (90 Base) MCG/ACT inhaler Generic drug: albuterol INHALE 2 PUFFS EVERY 4 HOURS AS NEEDED What changed: reasons to take this   albuterol (2.5 MG/3ML) 0.083% nebulizer solution Commonly known as: PROVENTIL Take 3 mLs (2.5 mg total) by nebulization every 6 (six) hours as needed for wheezing or shortness of breath. What changed: Another medication with the same name was changed. Make sure you understand how and when to take each.            Durable Medical Equipment  (From admission, onward)         Start     Ordered   05/02/19 1327  For home use only DME Walker rolling  Once    Question:  Patient needs a walker to treat with the following condition  Answer:  Balance disorder   05/02/19 1327         Follow-up Information    Home, [facility] Follow up.   Specialty: Home Health Services Contact information: [address] [place] [address] [phone]        Gaynelle Arabian, MD. Schedule an appointment as soon as possible for a visit in 1 week(s).   Specialty: Family Medicine Contact information: [address], [address] [place] [address] [phone]          Allergies  Allergen Reactions  . Cefaclor     rash  . Clarithromycin     rash  . Erythromycin     rash  . Iodine     rash    Consultations:  None   Procedures/Studies: Dg Chest Portable 1 View  Result Date: 04/26/2019 CLINICAL DATA:  78 year old female with cough and dyspnea. EXAM: PORTABLE CHEST 1 VIEW COMPARISON:   Chest radiograph dated 03/21/2019 and CT dated 08/11/2018. FINDINGS: There is emphysematous changes of the lungs. No focal consolidation, pleural effusion, or pneumothorax. Stable cardiac silhouette. Atherosclerotic calcification of the aortic arch. No acute osseous pathology. IMPRESSION: 1. No acute cardiopulmonary process. 2. Emphysema. Electronically Signed   By: Anner Crete M.D.   On: 04/26/2019 19:49       Subjective: Patient seen and examined the bedside this morning.  Hemodynamically stable for discharge.  Discharge Exam: Vitals:   05/03/19 0800 05/03/19 0804  BP: (!) 152/84   Pulse: 63   Resp: 13   Temp: 97.7 F (36.5 C)   SpO2: 100% 100%   Vitals:   05/03/19 0200 05/03/19 0400 05/03/19 0800 05/03/19 0804  BP: 128/62 135/75 (!) 152/84  Pulse: 68 64 63   Resp: 17  13   Temp:  98.3 F (36.8 C) 97.7 F (36.5 C)   TempSrc:  Oral Axillary   SpO2: 98% 100% 100% 100%  Weight:      Height:        General: Pt is alert, awake, not in acute distress Cardiovascular: RRR, S1/S2 +, no rubs, no gallops Respiratory: Bilateral decreased air entry Abdominal: Soft, NT, ND, bowel sounds + Extremities: no edema, no cyanosis    The results of significant diagnostics from this hospitalization (including imaging, microbiology, ancillary and laboratory) are listed below for reference.     Microbiology: Recent Results (from the past 240 hour(s))  SARS CORONAVIRUS 2 (TAT 6-24 HRS) Nasopharyngeal Nasopharyngeal Swab     Status: None   Collection Time: 04/26/19  7:39 PM   Specimen: Nasopharyngeal Swab  Result Value Ref Range Status   SARS Coronavirus 2 NEGATIVE NEGATIVE Final    Comment: (NOTE) SARS-CoV-2 target nucleic acids are NOT DETECTED. The SARS-CoV-2 RNA is generally detectable in upper and lower respiratory specimens during the acute phase of infection. Negative results do not preclude SARS-CoV-2 infection, do not rule out co-infections with other pathogens, and  should not be used as the sole basis for treatment or other patient management decisions. Negative results must be combined with clinical observations, patient history, and epidemiological information. The expected result is Negative. Fact Sheet for Patients: SugarRoll.be Fact Sheet for Healthcare Providers: https://www.woods-mathews.com/ This test is not yet approved or cleared by the [place] FDA and  has been authorized for detection and/or diagnosis of SARS-CoV-2 by FDA under an Emergency Use Authorization (EUA). This EUA will remain  in effect (meaning this test can be used) for the duration of the COVID-19 declaration under Section 56 4(b)(1) of the Act, 21 U.S.C. section 360bbb-3(b)(1), unless the authorization is terminated or revoked sooner. Performed at [facility], [place] [address], [place], [place] [address]   MRSA PCR Screening     Status: None   Collection Time: 04/27/19  3:40 AM  Result Value Ref Range Status   MRSA by PCR NEGATIVE NEGATIVE Final    Comment:        The GeneXpert MRSA Assay (FDA approved for NASAL specimens only), is one component of a comprehensive MRSA colonization surveillance program. It is not intended to diagnose MRSA infection nor to guide or monitor treatment for MRSA infections. Performed at [facility], [place] [address], [place], [place] [address]      Labs: BNP (last 3 results) Recent Labs    04/26/19 1939  BNP [id number]*   Basic Metabolic Panel: Recent Labs  Lab 04/28/19 0818 04/29/19 0216 04/30/19 0312 05/01/19 0234 05/02/19 0226  NA 141 141 140 140 139  K 4.3 4.6 4.4 3.8 4.2  CL 101 101 100 98 97*  CO2 30 31 35* 35* 36*  GLUCOSE 165* 144* 141* 164* 179*  BUN 25* 19 21 21 20   CREATININE 0.75 0.66 0.67 0.66 0.61  CALCIUM 9.2 8.9 8.9 8.8* 8.4*   Liver Function Tests: Recent Labs  Lab 04/26/19 1939  AST 31  ALT 26  ALKPHOS 75  BILITOT 1.3*   PROT 6.7  ALBUMIN 3.9   No results for input(s): LIPASE, AMYLASE in the last 168 hours. No results for input(s): AMMONIA in the last 168 hours. CBC: Recent Labs  Lab 04/28/19 0818 04/29/19 0216 04/30/19 0312 05/01/19 0234 05/02/19 0226  WBC 18.5* 15.0* 11.0* 11.9* 9.4  NEUTROABS 17.3* 13.9* 10.0* 9.8* 8.6*  HGB 13.1 11.9* 12.4 12.9 12.9  HCT 41.6 37.7 38.7 40.9 40.6  MCV 101.2* 101.1* 99.7 99.8 99.0  PLT 216 195 215 239 246   Cardiac Enzymes: No results for input(s): CKTOTAL, CKMB, CKMBINDEX, TROPONINI in the last 168 hours. BNP: Invalid input(s): POCBNP CBG: Recent Labs  Lab 04/28/19 0023 04/28/19 0358 04/28/19 0733 04/28/19 1141 04/28/19 1954  GLUCAP 137* 112* 143* 161* 164*   D-Dimer No results for input(s): DDIMER in the last 72 hours. Hgb A1c No results for input(s): HGBA1C in the last 72 hours. Lipid Profile No results for input(s): CHOL, HDL, LDLCALC, TRIG, CHOLHDL, LDLDIRECT in the last 72 hours. Thyroid function studies No results for input(s): TSH, T4TOTAL, T3FREE, THYROIDAB in the last 72 hours.  Invalid input(s): FREET3 Anemia work up No results for input(s): VITAMINB12, FOLATE, FERRITIN, TIBC, IRON, RETICCTPCT in the last 72 hours. Urinalysis No results found for: COLORURINE, APPEARANCEUR, [place], [place], [place], [place], [place], [place], PROTEINUR, UROBILINOGEN, NITRITE, LEUKOCYTESUR Sepsis Labs Invalid input(s): PROCALCITONIN,  WBC,  LACTICIDVEN Microbiology Recent Results (from the past 240 hour(s))  SARS CORONAVIRUS 2 (TAT 6-24 HRS) Nasopharyngeal Nasopharyngeal Swab     Status: None   Collection Time: 04/26/19  7:39 PM   Specimen: Nasopharyngeal Swab  Result Value Ref Range Status   SARS Coronavirus 2 NEGATIVE NEGATIVE Final    Comment: (NOTE) SARS-CoV-2 target nucleic acids are NOT DETECTED. The SARS-CoV-2 RNA is generally detectable in upper and lower respiratory specimens during the acute phase of infection. Negative results  do not preclude SARS-CoV-2 infection, do not rule out co-infections with other pathogens, and should not be used as the sole basis for treatment or other patient management decisions. Negative results must be combined with clinical observations, patient history, and epidemiological information. The expected result is Negative. Fact Sheet for Patients: SugarRoll.be Fact Sheet for Healthcare Providers: https://www.woods-mathews.com/ This test is not yet approved or cleared by the [place] FDA and  has been authorized for detection and/or diagnosis of SARS-CoV-2 by FDA under an Emergency Use Authorization (EUA). This EUA will remain  in effect (meaning this test can be used) for the duration of the COVID-19 declaration under Section 56 4(b)(1) of the Act, 21 U.S.C. section 360bbb-3(b)(1), unless the authorization is terminated or revoked sooner. Performed at [facility], [place] [address], [place], [place] [address]   MRSA PCR Screening     Status: None   Collection Time: 04/27/19  3:40 AM  Result Value Ref Range Status   MRSA by PCR NEGATIVE NEGATIVE Final    Comment:        The GeneXpert MRSA Assay (FDA approved for NASAL specimens only), is one component of a comprehensive MRSA colonization surveillance program. It is not intended to diagnose MRSA infection nor to guide or monitor treatment for MRSA infections. Performed at [facility], [place] [address], [place], [place] [address]     Please note: You were cared for by a hospitalist during your hospital stay. Once you are discharged, your primary care physician will handle any further medical issues. Please note that NO REFILLS for any discharge medications will be authorized once you are discharged, as it is imperative that you return to your primary care physician (or establish a relationship with a primary care physician if you do not have one) for your post  hospital discharge needs so that they can reassess your need for medications and monitor your lab values.  Time coordinating discharge: 40 minutes  SIGNED:   Shelly Coss, MD  [facility] 05/03/2019, 10:24 AM Pager [phone]  If 7PM-7AM, please contact night-coverage [email or web address] Password [email or web address]

## 2019-05-03 NOTE — Progress Notes (Signed)
PCCM Brief Note  I arranged for follow up visits for Ariel Gonzalez at [facility]:  05/10/2019 with B Mack and then 12/1 with me.   Baltazar Apo, MD, PhD 05/03/2019, 10:54 AM [facility] [phone] or if no answer [phone]

## 2019-05-03 NOTE — Progress Notes (Signed)
Pt discharged with daughter present to transport her home. AVS was went over by this RN with pt and pts daughter. Pt stated that she had no further questions. IV was removed and belongings were given to patient to take home.

## 2019-05-05 ENCOUNTER — Telehealth: Payer: Self-pay | Admitting: Emergency Medicine

## 2019-05-05 MED ORDER — STIOLTO RESPIMAT 2.5-2.5 MCG/ACT IN AERS: 2.0000 | INHALATION_SPRAY | Freq: Every day | RESPIRATORY_TRACT | 3 refills | Status: DC

## 2019-05-05 NOTE — Telephone Encounter (Signed)
Called and spoke with Patient.  Patient requested a refill for Stiolto to be sent to requested [facility].   Stiolto prescription sent.  Nothing further at this time.  Per 03/08/19 OV per Dr. Lamonte Sakai     Instructions  Please stop Symbicort and Spiriva for now. We will try starting Stiolto 2 puffs once daily to see if you get more benefit.  Please call when the samples run out to let us know if you have benefited.  If so then we will call this medication to your pharmacy. Keep your albuterol available to use either 1 nebulizer treatment or 2 puffs up to every 4 hours if needed for shortness of breath, chest tightness, wheezing. Continue your oxygen at up to 3.5 L/min when you are exerting herself. Continue pulmonary rehabilitation Your pneumonia shots are up-to-date Flu shot today. Follow with Dr Lamonte Sakai in 6 months or sooner if you have any problems

## 2019-05-06 DIAGNOSIS — I10 Essential (primary) hypertension: Secondary | ICD-10-CM | POA: Diagnosis not present

## 2019-05-06 DIAGNOSIS — F419 Anxiety disorder, unspecified: Secondary | ICD-10-CM | POA: Diagnosis not present

## 2019-05-06 DIAGNOSIS — J439 Emphysema, unspecified: Secondary | ICD-10-CM | POA: Diagnosis not present

## 2019-05-06 DIAGNOSIS — J309 Allergic rhinitis, unspecified: Secondary | ICD-10-CM | POA: Diagnosis not present

## 2019-05-06 DIAGNOSIS — J9621 Acute and chronic respiratory failure with hypoxia: Secondary | ICD-10-CM | POA: Diagnosis not present

## 2019-05-06 DIAGNOSIS — G43909 Migraine, unspecified, not intractable, without status migrainosus: Secondary | ICD-10-CM | POA: Diagnosis not present

## 2019-05-06 DIAGNOSIS — Z85828 Personal history of other malignant neoplasm of skin: Secondary | ICD-10-CM | POA: Diagnosis not present

## 2019-05-06 DIAGNOSIS — N3281 Overactive bladder: Secondary | ICD-10-CM | POA: Diagnosis not present

## 2019-05-06 DIAGNOSIS — M5136 Other intervertebral disc degeneration, lumbar region: Secondary | ICD-10-CM | POA: Diagnosis not present

## 2019-05-06 DIAGNOSIS — I480 Paroxysmal atrial fibrillation: Secondary | ICD-10-CM | POA: Diagnosis not present

## 2019-05-06 DIAGNOSIS — M8580 Other specified disorders of bone density and structure, unspecified site: Secondary | ICD-10-CM | POA: Diagnosis not present

## 2019-05-06 DIAGNOSIS — Z7901 Long term (current) use of anticoagulants: Secondary | ICD-10-CM | POA: Diagnosis not present

## 2019-05-06 DIAGNOSIS — E785 Hyperlipidemia, unspecified: Secondary | ICD-10-CM | POA: Diagnosis not present

## 2019-05-06 DIAGNOSIS — Z87891 Personal history of nicotine dependence: Secondary | ICD-10-CM | POA: Diagnosis not present

## 2019-05-06 DIAGNOSIS — I2584 Coronary atherosclerosis due to calcified coronary lesion: Secondary | ICD-10-CM | POA: Diagnosis not present

## 2019-05-06 DIAGNOSIS — I4892 Unspecified atrial flutter: Secondary | ICD-10-CM | POA: Diagnosis not present

## 2019-05-06 DIAGNOSIS — M5090 Cervical disc disorder, unspecified, unspecified cervical region: Secondary | ICD-10-CM | POA: Diagnosis not present

## 2019-05-06 DIAGNOSIS — F410 Panic disorder [episodic paroxysmal anxiety] without agoraphobia: Secondary | ICD-10-CM | POA: Diagnosis not present

## 2019-05-06 DIAGNOSIS — Z9981 Dependence on supplemental oxygen: Secondary | ICD-10-CM | POA: Diagnosis not present

## 2019-05-10 ENCOUNTER — Encounter: Payer: Self-pay | Admitting: Pulmonary Disease

## 2019-05-10 ENCOUNTER — Ambulatory Visit (INDEPENDENT_AMBULATORY_CARE_PROVIDER_SITE_OTHER): Payer: Medicare Other | Admitting: Pulmonary Disease

## 2019-05-10 ENCOUNTER — Other Ambulatory Visit: Payer: Self-pay

## 2019-05-10 VITALS — BP 110/58 | HR 74 | Temp 97.3°F | Ht 66.14 in | Wt 145.0 lb

## 2019-05-10 DIAGNOSIS — I480 Paroxysmal atrial fibrillation: Secondary | ICD-10-CM

## 2019-05-10 DIAGNOSIS — J441 Chronic obstructive pulmonary disease with (acute) exacerbation: Secondary | ICD-10-CM | POA: Diagnosis not present

## 2019-05-10 DIAGNOSIS — F419 Anxiety disorder, unspecified: Secondary | ICD-10-CM

## 2019-05-10 DIAGNOSIS — J9611 Chronic respiratory failure with hypoxia: Secondary | ICD-10-CM | POA: Diagnosis not present

## 2019-05-10 DIAGNOSIS — J449 Chronic obstructive pulmonary disease, unspecified: Secondary | ICD-10-CM

## 2019-05-10 DIAGNOSIS — Z87891 Personal history of nicotine dependence: Secondary | ICD-10-CM

## 2019-05-10 DIAGNOSIS — Z79899 Other long term (current) drug therapy: Secondary | ICD-10-CM

## 2019-05-10 MED ORDER — BUDESONIDE 0.5 MG/2ML IN SUSP: 0.5000 mg | Freq: Two times a day (BID) | RESPIRATORY_TRACT | 1 refills | Status: DC

## 2019-05-10 NOTE — Progress Notes (Signed)
@Patient  ID: Ariel Gonzalez, female    DOB: 03/22/1941, 78 y.o.   MRN: [id number]  Chief Complaint  Patient presents with  . Follow-up    COPD / HFU    Referring provider: Gaynelle Arabian, MD  HPI:  78 year old female former smoker followed in our office for COPD and chronic respiratory failure  PMH: A. fib, hypertension, hyperlipidemia, anxiety, chronic anticoagulation with Eliquis Smoker/ Smoking History: Former smoker.  Quit 2018.  50-pack-year smoking.  Maintenance: Stiolto Resp  Pt of: Dr. Lamonte Sakai  05/10/2019  - Visit   78 year old female former smoker followed in our office for COPD and chronic respiratory failure.  Patient presenting today as a hospital follow-up.  Patient was admitted on 04/26/2019 for COPD exacerbation.  She was discharged on 05/03/2019.  Patient was started on bronchodilators and steroids during her hospitalization.  Her respiratory status improved very slowly.  She felt back to baseline at discharge.  She was recommended to have home health at discharge.  Patient did have significant bouts of anxiety that required Precedex while hospitalized.  She was set up for this hospital follow-up today as well as a December/2020 follow-up with Dr. Lamonte Sakai in our office.  Patient presents today for office visit with her daughter.  They report the patient is doing okay and feels that she is slowly improving and getting back to baseline.  She still has an occasional audible wheeze.  She reports that this is also her baseline.  She is back to using Stiolto Respimat.  She is stopped using Symbicort and Spiriva.  She uses her rescue albuterol nebulizer 1-3 times daily.  More often than not it is 1-2 times daily.  Patient and daughter both have concerns of how to help limit COPD exacerbations and future hospitalizations.  They report the patient feels significantly better when on prednisone when prednisone is stopped then her symptoms return or worsen.  Prior to the most recent  hospitalization patient was also treated telephonically as a COPD exacerbation.  Patient felt that symptoms had improved while taking doxycycline as well as a course of prednisone.  As soon as the prednisone was finished patient symptoms returned and later she was hospitalized.  Questionaires / Pulmonary Flowsheets:   MMRC: mMRC Dyspnea Scale mMRC Score  05/10/2019 4    Tests:   08/11/2018-CT chest lung cancer screening-central tracheobronchial airways are patent, centrilobular and paraseptal emphysema noted, biapical pleural parenchymal scarring, scattered tiny bilateral pulmonary nodules are stable, no new suspicious nodule or mass, lung RADS 2  04/04/2019-echocardiogram-LV ejection fraction 60 to 65%, normal pulmonary artery pressure  11/09/2016-spirometry-FVC 1.9 (62% predicted), ratio 38, FEV1 0.7 (31% predicted)  04/26/2019-CBC with differential-eosinophils relative 2, eosinophils absolute 0.2  FENO:  No results found for: NITRICOXIDE  PFT: No flowsheet data found.  WALK:  SIX MIN WALK 05/10/2019 09/14/2016 09/14/2016  Supplimental Oxygen during Test? (L/min) Yes Yes No  O2 Flow Rate - 3 -  Type Pulse Pulse -  Tech Comments: Patient was short of breath and used walker. Started patient on room air and increased oxygen by a liter of pulsed oxygen as needed.  Patient required 4 L pulsed oxygen to maintain saturations in 90's.//btr Pt oxygen levels rose to 91 while relaxing and wearing 4L of oxygen  Pt oxygen dropped while walking from scale to room.      Imaging: Dg Chest Portable 1 View  Result Date: 04/26/2019 CLINICAL DATA:  78 year old female with cough and dyspnea. EXAM: PORTABLE CHEST 1 VIEW COMPARISON:  Chest radiograph dated 03/21/2019 and CT dated 08/11/2018. FINDINGS: There is emphysematous changes of the lungs. No focal consolidation, pleural effusion, or pneumothorax. Stable cardiac silhouette. Atherosclerotic calcification of the aortic arch. No acute osseous pathology.  IMPRESSION: 1. No acute cardiopulmonary process. 2. Emphysema. Electronically Signed   By: Anner Crete M.D.   On: 04/26/2019 19:49    Lab Results:  CBC    Component Value Date/Time   WBC 9.4 05/02/2019 0226   RBC 4.10 05/02/2019 0226   HGB 12.9 05/02/2019 0226   HCT 40.6 05/02/2019 0226   PLT 246 05/02/2019 0226   MCV 99.0 05/02/2019 0226   MCH 31.5 05/02/2019 0226   MCHC 31.8 05/02/2019 0226   RDW 12.7 05/02/2019 0226   LYMPHSABS 0.3 (L) 05/02/2019 0226   MONOABS 0.3 05/02/2019 0226   EOSABS 0.0 05/02/2019 0226   BASOSABS 0.0 05/02/2019 0226    BMET    Component Value Date/Time   NA 139 05/02/2019 0226   K 4.2 05/02/2019 0226   CL 97 (L) 05/02/2019 0226   CO2 36 (H) 05/02/2019 0226   GLUCOSE 179 (H) 05/02/2019 0226   BUN 20 05/02/2019 0226   CREATININE 0.61 05/02/2019 0226   CREATININE 0.72 12/12/2015 0919   CALCIUM 8.4 (L) 05/02/2019 0226   GFRNONAA >60 05/02/2019 0226   GFRAA >60 05/02/2019 0226    BNP    Component Value Date/Time   BNP 145.9 (H) 04/26/2019 1939    ProBNP No results found for: PROBNP  Specialty Problems      Pulmonary Problems   COPD (chronic obstructive pulmonary disease) ([place])    08/11/2018-CT chest lung cancer screening-central tracheobronchial airways are patent, centrilobular and paraseptal emphysema noted, biapical pleural parenchymal scarring, scattered tiny bilateral pulmonary nodules are stable, no new suspicious nodule or mass, lung RADS 2  11/09/2016-spirometry-FVC 1.9 (62% predicted), ratio 38, FEV1 0.7 (31% predicted)       Chronic respiratory failure with hypoxia (HCC)    Qualifier: Diagnosis of  By: Melvyn Novas MD, Christena Deem       UPPER RESPIRATORY INFECTION    Qualifier: Diagnosis of  By: Lamonte Sakai MD, Rose Fillers       Allergic rhinitis   Cough   COPD exacerbation (HCC)      Allergies  Allergen Reactions  . Cefaclor     rash  . Clarithromycin     rash  . Erythromycin     rash  . Iodine     rash     Immunization History  Administered Date(s) Administered  . Fluad Quad(high Dose 65+) 03/08/2019  . Influenza Split 05/13/2012  . Influenza Whole 05/25/2011  . Influenza, High Dose Seasonal PF 07/13/2016, 04/07/2018  . Influenza,inj,Quad PF,6+ Mos 04/18/2013, 05/04/2014, 06/13/2015  . Influenza-Unspecified 07/13/2016  . Pneumococcal Conjugate-13 06/18/2014  . Pneumococcal Polysaccharide-23 11/23/2017    Past Medical History:  Diagnosis Date  . Allergic rhinitis   . Arthritis    CERVICAL AND LUMBER DDD  . COPD (chronic obstructive pulmonary disease) ([place])    HFA 75% p coaching July 17, 2010  . HTN (hypertension)   . Migraine   . Osteopenia   . Overactive bladder   . Panic attacks   . Squamous cell carcinoma of skin     Tobacco History: Social History   Tobacco Use  Smoking Status Former Smoker  . Packs/day: 1.00  . Years: 50.00  . Pack years: 50.00  . Types: Cigarettes  . Quit date: 04/01/2008  . Years since quitting: 82.1  Smokeless Tobacco Never Used  Tobacco Comment   Counseled to remain smoke free   Counseling given: Yes Comment: Counseled to remain smoke free  Continue to not smoke  Outpatient Encounter Medications as of 05/10/2019  Medication Sig  . albuterol (PROVENTIL) (2.5 MG/3ML) 0.083% nebulizer solution Take 3 mLs (2.5 mg total) by nebulization every 6 (six) hours as needed for wheezing or shortness of breath.  Marland Kitchen amLODipine (NORVASC) 5 MG tablet Take 5 mg by mouth daily.  Marland Kitchen apixaban (ELIQUIS) 5 MG TABS tablet Take 1 tablet (5 mg total) by mouth 2 (two) times daily.  Marland Kitchen atorvastatin (LIPITOR) 40 MG tablet TAKE 1 TABLET BY MOUTH ONCE DAILY (Patient taking differently: Take 40 mg by mouth daily. )  . buPROPion (WELLBUTRIN XL) 150 MG 24 hr tablet Take 1 tablet by mouth daily.  . calcium carbonate (OS-CAL) 600 MG TABS Take 600 mg by mouth daily.    . Cholecalciferol (PA VITAMIN D-3) 2000 UNITS CAPS Take 2,000 Units by mouth daily.   . citalopram (CELEXA)  20 MG tablet Take 40 mg by mouth daily.   . clonazePAM (KLONOPIN) 1 MG tablet Half tablet to whole tablet twice a day for anxiety  . diltiazem (CARDIZEM CD) 120 MG 24 hr capsule Take 1 capsule (120 mg total) by mouth daily.  . montelukast (SINGULAIR) 10 MG tablet Take 1 tablet (10 mg total) by mouth at bedtime.  . Multiple Vitamin (MULTIVITAMIN) tablet Take 1 tablet by mouth daily.    . OXYGEN Inhale 3.5 L into the lungs continuous.  . predniSONE (DELTASONE) 10 MG tablet Take 4 pills for 3 days then 3 pills for 3 days then 2 pills for 3 days then 1 pill for 3 days then stop  . Tiotropium Bromide-Olodaterol (STIOLTO RESPIMAT) 2.5-2.5 MCG/ACT AERS Inhale 2 puffs into the lungs daily.  . VENTOLIN HFA 108 (90 Base) MCG/ACT inhaler INHALE 2 PUFFS EVERY 4 HOURS AS NEEDED (Patient taking differently: Inhale 2 puffs into the lungs every 4 (four) hours as needed for wheezing or shortness of breath. )  . ALPRAZolam (XANAX) 0.5 MG tablet Take 0.5 mg by mouth 3 (three) times daily as needed for anxiety or sleep. 1 every 6 hours as needed  . budesonide (PULMICORT) 0.5 MG/2ML nebulizer solution Take 2 mLs (0.5 mg total) by nebulization 2 (two) times daily.   No facility-administered encounter medications on file as of 05/10/2019.      Review of Systems  Review of Systems  Constitutional: Positive for fatigue. Negative for activity change and fever.  HENT: Negative for sinus pressure, sinus pain and sore throat.   Respiratory: Positive for shortness of breath and wheezing. Negative for cough.   Cardiovascular: Negative for chest pain and palpitations.  Musculoskeletal: Positive for gait problem (working with [facility] and PT). Negative for arthralgias.  Neurological: Negative for dizziness.  Psychiatric/Behavioral: Negative for sleep disturbance. The patient is not nervous/anxious.      Physical Exam  BP (!) 110/58 (BP Location: Left Arm, Cuff Size: Normal)   Pulse 74   Temp (!) 97.3 F (36.3 C)  (Temporal)   Ht 5' 6.14" (1.68 m)   Wt 145 lb (65.8 kg)   BMI 23.30 kg/m   Wt Readings from Last 5 Encounters:  05/10/19 145 lb (65.8 kg)  04/27/19 145 lb 11.6 oz (66.1 kg)  04/14/19 144 lb (65.3 kg)  04/04/19 145 lb 8.1 oz (66 kg)  03/27/19 143 lb 9.6 oz (65.1 kg)    BMI Readings from  Last 5 Encounters:  05/10/19 23.30 kg/m  04/27/19 23.52 kg/m  04/14/19 23.24 kg/m  04/04/19 23.48 kg/m  03/27/19 23.18 kg/m     Physical Exam Vitals signs and nursing note reviewed.  Constitutional:      General: She is not in acute distress.    Appearance: She is normal weight.     Comments: Chronically ill elderly female  HENT:     Head: Normocephalic and atraumatic.     Right Ear: Tympanic membrane, ear canal and external ear normal. There is no impacted cerumen.     Left Ear: Tympanic membrane, ear canal and external ear normal. There is no impacted cerumen.     Nose: Nose normal. No congestion.     Mouth/Throat:     Mouth: Mucous membranes are dry.     Pharynx: Oropharynx is clear.  Eyes:     Pupils: Pupils are equal, round, and reactive to light.  Neck:     Musculoskeletal: Normal range of motion.  Cardiovascular:     Rate and Rhythm: Normal rate and regular rhythm.     Pulses: Normal pulses.     Heart sounds: Normal heart sounds. No murmur.  Pulmonary:     Effort: Pulmonary effort is normal. No respiratory distress.     Breath sounds: No decreased air movement. No decreased breath sounds, wheezing or rales.     Comments: Barrel chest Diminished breath sounds  Skin:    General: Skin is warm and dry.     Capillary Refill: Capillary refill takes less than 2 seconds.  Neurological:     General: No focal deficit present.     Mental Status: She is alert and oriented to person, place, and time. Mental status is at baseline.     Gait: Gait abnormal (Tolerated walk in office while using walker).  Psychiatric:        Mood and Affect: Mood normal.        Behavior: Behavior  normal.        Thought Content: Thought content normal.        Judgment: Judgment normal.       Assessment & Plan:   Paroxysmal atrial fibrillation (HCC) Plan: Continue follow-up with cardiology Continue Eliquis Continue your medications as managed by cardiology  Chronic respiratory failure with hypoxia ([place]) Tolerated walk in office today with walker.  Patient required 4 L pulsed  Plan: Continue oxygen as prescribed 3 to 4 L continuous at home, 4 L pulsed with physical exertion Likely this can be reevaluated with patient's COPD exacerbation slowly resolves Continue to monitor oxygen saturations at home to maintain oxygen saturations above 88 to 90%  COPD (chronic obstructive pulmonary disease) (HCC) Likely COPD stage III or IV based off of 2018 spirometry Is a high chance that this is also worsened over the last 2 years Recent hospitalization for COPD exacerbation for 7 days, slow to resolve and improve Component of shortness of breath is likely multifactorial as patient is also having paroxysmal A. fib, chronic respiratory failure, anxiety and COPD Patient likes Stiolto Respimat Patient reports that she feels better when on steroids No audible wheezing today, clear breath sounds October/2020 lab work shows eosinophil count of 200  Plan: Continue Stiolto Respimat Walk today in office Maintain oxygen saturations as prescribed Trial patient on budesonide nebulized medication for maintenance therapy  >>>to help limit overall steroid use as well as to prevent readmission to the hospital, discussed pros and cons of this and family would like to  proceed forward with the trial of budesonide nebulized medication for maintenance therapy Continue albuterol nebulized meds or rescue inhaler as needed for shortness of breath or wheezing Continue to not smoke   Former smoker Plan:  Continue to not smoke  Anxiety Required Precedex when inpatient for COPD exacerbation Maintained  on benzodiazepines Managed by primary care  Plan: Continue management with primary care If anxiety symptoms start to worsen please present back to primary care  Medication management Recently started on Stiolto Respimat, family unsure of overall price  Plan: Daughter will investigate price of Stiolto Respimat, if price of Stiolto Respimat is unaffordable or high can consider referral to [facility] for pharmacy team evaluation or can do referral to clinical pharmacy team in our office  We will do a trial of budesonide nebulized meds today to see if this can help stabilize the patient's breathing and limit future exacerbations  COPD exacerbation ([place]) Slowly resolving, improving today Still on prednisone taper  Plan: Finish prednisone as outlined Keep close monitoring of your symptoms We will do trial of budesonide nebulized meds    Return in about 6 weeks (around 06/21/2019), or if symptoms worsen or fail to improve, for Follow up with Dr. Lamonte Sakai.   Lauraine Rinne, NP 05/10/2019   This appointment was 48 minutes long with over 50% of the time in direct face-to-face patient care, assessment, plan of care, and follow-up.

## 2019-05-10 NOTE — Assessment & Plan Note (Signed)
Tolerated walk in office today with walker.  Patient required 4 L pulsed  Plan: Continue oxygen as prescribed 3 to 4 L continuous at home, 4 L pulsed with physical exertion Likely this can be reevaluated with patient's COPD exacerbation slowly resolves Continue to monitor oxygen saturations at home to maintain oxygen saturations above 88 to 90%

## 2019-05-10 NOTE — Assessment & Plan Note (Signed)
Required Precedex when inpatient for COPD exacerbation Maintained on benzodiazepines Managed by primary care  Plan: Continue management with primary care If anxiety symptoms start to worsen please present back to primary care

## 2019-05-10 NOTE — Assessment & Plan Note (Addendum)
Likely COPD stage III or IV based off of 2018 spirometry Is a high chance that this is also worsened over the last 2 years Recent hospitalization for COPD exacerbation for 7 days, slow to resolve and improve Component of shortness of breath is likely multifactorial as patient is also having paroxysmal A. fib, chronic respiratory failure, anxiety and COPD Patient likes Stiolto Respimat Patient reports that she feels better when on steroids No audible wheezing today, clear breath sounds October/2020 lab work shows eosinophil count of 200  Plan: Continue Stiolto Respimat Walk today in office Maintain oxygen saturations as prescribed Trial patient on budesonide nebulized medication for maintenance therapy  >>>to help limit overall steroid use as well as to prevent readmission to the hospital, discussed pros and cons of this and family would like to proceed forward with the trial of budesonide nebulized medication for maintenance therapy Continue albuterol nebulized meds or rescue inhaler as needed for shortness of breath or wheezing Continue to not smoke

## 2019-05-10 NOTE — Assessment & Plan Note (Signed)
Plan: Continue to not smoke 

## 2019-05-10 NOTE — Assessment & Plan Note (Signed)
Slowly resolving, improving today Still on prednisone taper  Plan: Finish prednisone as outlined Keep close monitoring of your symptoms We will do trial of budesonide nebulized meds

## 2019-05-10 NOTE — Patient Instructions (Addendum)
You were seen today by Lauraine Rinne, NP  for:   1. Chronic obstructive pulmonary disease, unspecified COPD type (HCC)  Stiolto Respimat inhaler >>>2 puffs daily >>>Take this no matter what >>>This is not a rescue inhaler  Please check on the cost of this medication from your pharmacy, we can get you set up for a pharmacy appointment with our office or over the phone if you are having difficulty affording this medication or if the cost is high  - budesonide (PULMICORT) 0.5 MG/2ML nebulizer solution; Take 2 mLs (0.5 mg total) by nebulization 2 (two) times daily.  Dispense: 60 mL; Refill: 1  Only use your albuterol as a rescue medication to be used if you can't catch your breath by resting or doing a relaxed purse lip breathing pattern.  - The less you use it, the better it will work when you need it. - Ok to use up to 2 puffs  every 4 hours if you must but call for immediate appointment if use goes up over your usual need - Don't leave home without it !!  (think of it like the spare tire for your car)   Note your daily symptoms > remember "red flags" for COPD:   >>>Increase in cough >>>increase in sputum production >>>increase in shortness of breath or activity  intolerance.   If you notice these symptoms, please call the office to be seen.     Please continue to use your incentive spirometer, attempt to use this at least 20 breaths a day, record your volumes, try to achieve 1000  Please continue to use your flutter valve (or which you called the cucumber) >>>Use a flutter valve 10 breaths twice a day or 4 to 5 breaths 4-5 times a day to help clear mucus out     2. COPD exacerbation ([place])  Finish up prednisone as prescribed at discharge  Notify our office if symptoms are worsening or if your oxygen levels are dropping  3. Chronic respiratory failure with hypoxia (HCC)  Continue oxygen therapy as prescribed - 3-4 L continuous at rest, 4 L pulsed with exertion >>>maintain  oxygen saturations greater than 88 percent  >>>if unable to maintain oxygen saturations please contact the office  >>>do not smoke with oxygen  >>>can use nasal saline gel or nasal saline rinses to moisturize nose if oxygen causes dryness  - Ambulatory Referral for DME  4. Paroxysmal atrial fibrillation ([place])  Continue follow-up with cardiology If you do not have scheduled follow-up with cardiology please make this appointment   We recommend today:  Orders Placed This Encounter  Procedures  . Ambulatory Referral for DME    Referral Priority:   Routine    Referral Type:   Durable Medical Equipment Purchase    Number of Visits Requested:   1   Orders Placed This Encounter  Procedures  . Ambulatory Referral for DME   Meds ordered this encounter  Medications  . budesonide (PULMICORT) 0.5 MG/2ML nebulizer solution    Sig: Take 2 mLs (0.5 mg total) by nebulization 2 (two) times daily.    Dispense:  60 mL    Refill:  1    Follow Up:    Return in about 6 weeks (around 06/21/2019), or if symptoms worsen or fail to improve, for Follow up with Dr. Lamonte Sakai.   Please do your part to reduce the spread of COVID-19:      Reduce your risk of any infection  and COVID19 by using the  similar precautions used for avoiding the common cold or flu:  Marland Kitchen Wash your hands often with soap and warm water for at least 20 seconds.  If soap and water are not readily available, use an alcohol-based hand sanitizer with at least 60% alcohol.  . If coughing or sneezing, cover your mouth and nose by coughing or sneezing into the elbow areas of your shirt or coat, into a tissue or into your sleeve (not your hands). Langley Gauss A MASK when in public  . Avoid shaking hands with others and consider head nods or verbal greetings only. . Avoid touching your eyes, nose, or mouth with unwashed hands.  . Avoid close contact with people who are sick. . Avoid places or events with large numbers of people in one location,  like concerts or sporting events. . If you have some symptoms but not all symptoms, continue to monitor at home and seek medical attention if your symptoms worsen. . If you are having a medical emergency, call 911.   [place] / e-Visit: [email or web address]         [facility]: [facility]: [id number]                   [facility]: [id number]     It is flu season:   >>> Best ways to protect herself from the flu: Receive the yearly flu vaccine, practice good hand hygiene washing with soap and also using hand sanitizer when available, eat a nutritious meals, get adequate rest, hydrate appropriately   Please contact the office if your symptoms worsen or you have concerns that you are not improving.   Thank you for choosing [facility] for your healthcare, and for allowing [place] to partner with you on your healthcare journey. I am thankful to be able to provide care to you today.   Wyn Quaker FNP-C    COPD and Physical Activity Chronic obstructive pulmonary disease (COPD) is a long-term (chronic) condition that affects the lungs. COPD is a general term that can be used to describe many different lung problems that cause lung swelling (inflammation) and limit airflow, including chronic bronchitis and emphysema. The main symptom of COPD is shortness of breath, which makes it harder to do even simple tasks. This can also make it harder to exercise and be active. Talk with your health care provider about treatments to help you breathe better and actions you can take to prevent breathing problems during physical activity. What are the benefits of exercising with COPD? Exercising regularly is an important part of a healthy lifestyle. You can still exercise and do physical activities even though you have COPD. Exercise and physical  activity improve your shortness of breath by increasing blood flow (circulation). This causes your heart to pump more oxygen through your body. Moderate exercise can improve your:  Oxygen use.  Energy level.  Shortness of breath.  Strength in your breathing muscles.  Heart health.  Sleep.  Self-esteem and feelings of self-worth.  Depression, stress, and anxiety levels. Exercise can benefit everyone with COPD. The severity of your disease may affect how hard you can exercise, especially at first, but everyone can benefit. Talk with your health care provider about how much exercise is safe for you, and which activities and exercises are safe for you. What actions can I take to prevent breathing problems during physical activity?  Sign up for a  pulmonary rehabilitation program. This type of program may include: ? Education about lung diseases. ? Exercise classes that teach you how to exercise and be more active while improving your breathing. This usually involves:  Exercise using your lower extremities, such as a stationary bicycle.  About 30 minutes of exercise, 2 to 5 times per week, for 6 to 12 weeks  Strength training, such as push ups or leg lifts. ? Nutrition education. ? Group classes in which you can talk with others who also have COPD and learn ways to manage stress.  If you use an oxygen tank, you should use it while you exercise. Work with your health care provider to adjust your oxygen for your physical activity. Your resting flow rate is different from your flow rate during physical activity.  While you are exercising: ? Take slow breaths. ? Pace yourself and do not try to go too fast. ? Purse your lips while breathing out. Pursing your lips is similar to a kissing or whistling position. ? If doing exercise that uses a quick burst of effort, such as weight lifting:  Breathe in before starting the exercise.  Breathe out during the hardest part of the exercise (such  as raising the weights). Where to find support You can find support for exercising with COPD from:  Your health care provider.  A pulmonary rehabilitation program.  Your local health department or community health programs.  Support groups, online or in-person. Your health care provider may be able to recommend support groups. Where to find more information You can find more information about exercising with COPD from:  American Lung Association: ClassInsider.se.  COPD Foundation: https://www.rivera.net/. Contact a health care provider if:  Your symptoms get worse.  You have chest pain.  You have nausea.  You have a fever.  You have trouble talking or catching your breath.  You want to start a new exercise program or a new activity. Summary  COPD is a general term that can be used to describe many different lung problems that cause lung swelling (inflammation) and limit airflow. This includes chronic bronchitis and emphysema.  Exercise and physical activity improve your shortness of breath by increasing blood flow (circulation). This causes your heart to provide more oxygen to your body.  Contact your health care provider before starting any exercise program or new activity. Ask your health care provider what exercises and activities are safe for you. This information is not intended to replace advice given to you by your health care provider. Make sure you discuss any questions you have with your health care provider. Document Released: 07/22/2017 Document Revised: 10/19/2018 Document Reviewed: 07/22/2017 Elsevier Patient Education  2020 [employer].

## 2019-05-10 NOTE — Assessment & Plan Note (Signed)
Recently started on Stiolto Respimat, family unsure of overall price  Plan: Daughter will investigate price of Stiolto Respimat, if price of Stiolto Respimat is unaffordable or high can consider [facility] for pharmacy team evaluation or can do referral to clinical pharmacy team in our office  We will do a trial of budesonide nebulized meds today to see if this can help stabilize the patient's breathing and limit future exacerbations

## 2019-05-10 NOTE — Assessment & Plan Note (Addendum)
Plan: Continue follow-up with cardiology Continue Eliquis Continue your medications as managed by cardiology

## 2019-05-11 ENCOUNTER — Telehealth: Payer: Self-pay | Admitting: Pulmonary Disease

## 2019-05-11 DIAGNOSIS — J441 Chronic obstructive pulmonary disease with (acute) exacerbation: Secondary | ICD-10-CM | POA: Diagnosis not present

## 2019-05-11 DIAGNOSIS — Z79899 Other long term (current) drug therapy: Secondary | ICD-10-CM

## 2019-05-11 DIAGNOSIS — F411 Generalized anxiety disorder: Secondary | ICD-10-CM | POA: Diagnosis not present

## 2019-05-11 DIAGNOSIS — F331 Major depressive disorder, recurrent, moderate: Secondary | ICD-10-CM | POA: Diagnosis not present

## 2019-05-11 DIAGNOSIS — J449 Chronic obstructive pulmonary disease, unspecified: Secondary | ICD-10-CM

## 2019-05-11 DIAGNOSIS — I4891 Unspecified atrial fibrillation: Secondary | ICD-10-CM | POA: Diagnosis not present

## 2019-05-11 MED ORDER — STIOLTO RESPIMAT 2.5-2.5 MCG/ACT IN AERS: 2.0000 | INHALATION_SPRAY | Freq: Every day | RESPIRATORY_TRACT | 0 refills | Status: DC

## 2019-05-11 NOTE — Telephone Encounter (Signed)
Can we receive more information such as what was the initial cost and then what is "double the expenses before"?  There are a few issues of what could be going on:  Stiolto could be not formulary for the patient Patient could be in the donut hole coverage As it October/2020 Or it could be both  My recommendations would be that we refer the [facility] for a nurse to contact them as well as to get scheduled for a pharmacy out reach call.   If the patient would prefer an in person pharmacy appointment then we can schedule them on either Monday mornings or Friday for a clinical pharmacist appointment to review patient's formulary as well as to help understand cost and access barriers to Stiolto Respimat.  We can try her best throughout this time to supply the patient with samples, but understands that samples or not a endless resource and that some days we just simply do not have them.  Wyn Quaker, FNP

## 2019-05-11 NOTE — Telephone Encounter (Signed)
Called and spoke to pt and son. Informed them of the recs per Aaron Edelman. They verbalized understanding. I attempted to contact [place] for more information but was disconnected 4 different times. Informed them of the inability to connect with the pharmacy. They are aware of the [facility] referral and aware pharmacy may contact them as well. Stiolto samples have been left up front for pt. Pt verbalized understanding and denied any further questions or concerns at this time.    Pharmacy, please eval with pt's Stiolto to see if your assistance is needed. A [facility] referral has been placed as well.

## 2019-05-11 NOTE — Telephone Encounter (Signed)
Spoke with patient.  She state Stiolto is double the expense as before.   per 05/10/19 patient instruction BPM  "Medication management Recently started on Stiolto Respimat, family unsure of overall price  Plan: Daughter will investigate price of Stiolto Respimat, if price of Stiolto Respimat is unaffordable or high can consider [facility] for pharmacy team evaluation or can do referral to clinical pharmacy team in our office"  Aaron Edelman please advise what to do next.

## 2019-05-15 ENCOUNTER — Other Ambulatory Visit: Payer: Self-pay

## 2019-05-15 ENCOUNTER — Other Ambulatory Visit: Payer: Self-pay | Admitting: *Deleted

## 2019-05-15 ENCOUNTER — Encounter: Payer: Self-pay | Admitting: *Deleted

## 2019-05-15 NOTE — Patient Outreach (Signed)
Patient was recently discharged from hospital and all medications have been reviewed. Outpatient Encounter Medications as of 05/15/2019  Medication Sig  . albuterol (PROVENTIL) (2.5 MG/3ML) 0.083% nebulizer solution Take 3 mLs (2.5 mg total) by nebulization every 6 (six) hours as needed for wheezing or shortness of breath.  . ALPRAZolam (XANAX) 0.5 MG tablet Take 0.5 mg by mouth 3 (three) times daily as needed for anxiety or sleep. 1 every 6 hours as needed  . amLODipine (NORVASC) 5 MG tablet Take 5 mg by mouth daily.  Marland Kitchen apixaban (ELIQUIS) 5 MG TABS tablet Take 1 tablet (5 mg total) by mouth 2 (two) times daily.  Marland Kitchen atorvastatin (LIPITOR) 40 MG tablet TAKE 1 TABLET BY MOUTH ONCE DAILY (Patient taking differently: Take 40 mg by mouth daily. )  . budesonide (PULMICORT) 0.5 MG/2ML nebulizer solution Take 2 mLs (0.5 mg total) by nebulization 2 (two) times daily.  Marland Kitchen buPROPion (WELLBUTRIN XL) 150 MG 24 hr tablet Take 1 tablet by mouth daily.  . calcium carbonate (OS-CAL) 600 MG TABS Take 600 mg by mouth daily.    . Cholecalciferol (PA VITAMIN D-3) 2000 UNITS CAPS Take 2,000 Units by mouth daily.   . citalopram (CELEXA) 20 MG tablet Take 40 mg by mouth daily.   . clonazePAM (KLONOPIN) 1 MG tablet Half tablet to whole tablet twice a day for anxiety  . diltiazem (CARDIZEM CD) 120 MG 24 hr capsule Take 1 capsule (120 mg total) by mouth daily.  . montelukast (SINGULAIR) 10 MG tablet Take 1 tablet (10 mg total) by mouth at bedtime.  . Multiple Vitamin (MULTIVITAMIN) tablet Take 1 tablet by mouth daily.    . OXYGEN Inhale 3.5 L into the lungs continuous.  . predniSONE (DELTASONE) 10 MG tablet Take 4 pills for 3 days then 3 pills for 3 days then 2 pills for 3 days then 1 pill for 3 days then stop  . Tiotropium Bromide-Olodaterol (STIOLTO RESPIMAT) 2.5-2.5 MCG/ACT AERS Inhale 2 puffs into the lungs daily.  . VENTOLIN HFA 108 (90 Base) MCG/ACT inhaler INHALE 2 PUFFS EVERY 4 HOURS AS NEEDED (Patient taking  differently: Inhale 2 puffs into the lungs every 4 (four) hours as needed for wheezing or shortness of breath. )  . Tiotropium Bromide-Olodaterol (STIOLTO RESPIMAT) 2.5-2.5 MCG/ACT AERS Inhale 2 puffs into the lungs daily.   No facility-administered encounter medications on file as of 05/15/2019.    I DO HAVE A QUESTION ABOUT MRS. Ariel Gonzalez'S MEDICATION REGIMEN. SHE HAS PRESCRIPTIONS FOR BOTH ALPRAZOLAM 0.5 MG TID AS NEEDED FOR ANXIETY AND SLEEP AND CLONAZEPAM 1 MG 1/2 TAB BID FOR ANXIETY. SHOULD SHE BE TAKING BOTH OF THESE? PLEASE ADVISE, THANK YOU!  [name]. Myrtie Neither, MSN, [facility] Gerontological Nurse Practitioner [facility] Care Management [phone]

## 2019-05-15 NOTE — Patient Outreach (Addendum)
Initial telephone outreach for [facility] chronic disease care management (COPD).  Talked briefly to pt's daughter, Ariel Gonzalez, who took a message and will ask her mother to call me when it is convenient today.  [name]. Myrtie Neither, MSN, GNP-BC Gerontological Nurse Practitioner [facility] Care Management [phone]  Mrs. Handlin called me back and I was able to complete and initial care management call.  She has agreed to talk with me weekly this month (unless she feels it is no longer a benefit).  She is feeling stable and is getting stronger with therapy. Her daughter is on a LOA to be with her and support her.  [facility] CM Care Plan Problem One     Most Recent Value  Care Plan Problem One  COPD  Role Documenting the Problem One  Care Management Coordinator  Care Plan for Problem One  Active  [facility] Long Term Goal   Pt will not readmit for COPD in the next 31 days.  [facility] Long Term Goal Start Date  05/15/19  Interventions for Problem One Long Term Goal  Advised that pts that have been in the hospital have a higher risk of readmission and need close follow up.  [facility] CM Short Term Goal #1   Pt to follow up with pulmonologist in the next 2 weeks.  [facility] CM Short Term Goal #1 Start Date  05/15/19  Interventions for Short Term Goal #1  Verified that pt does have an appt. Has already had virtual visit with primary care. Emphasized how important these visits are to prevent rehospsitalization.  [facility] CM Short Term Goal #2   Pt will recieve and review the COPD Action plan in the next 2 weeks.  [facility] CM Short Term Goal #2 Start Date  05/15/19  Interventions for Short Term Goal #2  Discussed what the COPD Action Plan is.     She reports she has a HCPOA and a living will but they are not in the Epic medical record. We completed a MOST form today, so that her wishes can be known.  [name]. Myrtie Neither, MSN, [facility] Gerontological Nurse Practitioner [facility] Care Management [phone]

## 2019-05-16 NOTE — Telephone Encounter (Signed)
Unable to process test claim due to refill too soon rejection. Called [facility], Patient picked up prescription. Her copay was $110.78 for Stiolto. Patient is in the coverage gap.   11:35 AM Beatriz Chancellor, CPhT

## 2019-05-18 NOTE — Telephone Encounter (Signed)
LMTCB x1 for pt's daughter, [name].

## 2019-05-18 NOTE — Telephone Encounter (Signed)
Pt daughter calling back to get some carification on last message to mother about medication could a nurse call her back at [phone] her name is Sandy

## 2019-05-23 ENCOUNTER — Encounter: Payer: Self-pay | Admitting: *Deleted

## 2019-05-23 ENCOUNTER — Other Ambulatory Visit: Payer: Self-pay | Admitting: *Deleted

## 2019-05-23 NOTE — Telephone Encounter (Signed)
Message below forwarded to [place] Team.  Ariel Gonzalez, I called and spoke with patient daughter Ariel Gonzalez. She did confirm her mom picked up the medication from pharmacy and the samples. But she is in the donut hole and not able to afford the medication right now.  I suggested until they are contacted back by the pharmacy team to look on the manufacturer website to see if there are coupons available and to get familiar with them so that when they are called by the pharmacy team they will better understand how they are used.  I told Ariel Gonzalez about the formulary list and tiers as she was new to this process for her mom. She stated she will go by her mom's tomorrow and see what is on the manufacturer site.  Can you give them a call back to let them know about what your team does and the help that can be provided? I am sure she will appreciate it. Thank you much.

## 2019-05-23 NOTE — Telephone Encounter (Signed)
Attempted to call pt's daughter [name] but unable to reach.left message for her to return call.

## 2019-05-23 NOTE — Patient Outreach (Signed)
Telephone outreach unsuccessful, left a message and requested a return call.  [name]. Myrtie Neither, MSN, GNP-BC Gerontological Nurse Practitioner [facility] Care Management [phone]  Mrs. Pell returned my call. She reports she is doing well considering what she has been through. She says she doesn't feel as well as when she was on the prednisone. She is getting used to her new normal.  She takes her respiratory meds faithfully. Her nebulizer really helps her the most. She is wearing her O2 continuously. She is staying home and avoiding exposure to [place].  She is still working with therapy and this has been very helpful to her. She is gaining a bit more stamina. She does still report some SOB with minimal to moderate exertion.  [facility] CM Care Plan Problem One     Most Recent Value  Care Plan Problem One  COPD  Role Documenting the Problem One  Care Management Coordinator  Care Plan for Problem One  Active  [facility] Long Term Goal   Pt will not readmit for COPD in the next 31 days.  [facility] Long Term Goal Start Date  05/15/19  Interventions for Problem One Long Term Goal  reinforced following COPD Action plan and calling MD when in [place] for instructions.  [facility] CM Short Term Goal #1   Pt to follow up with pulmonologist in the next 2 weeks.  [facility] CM Short Term Goal #1 Start Date  05/15/19  [facility] CM Short Term Goal #1 Met Date  05/23/19  [facility] CM Short Term Goal #2   Pt will recieve and review the COPD Action plan in the next 2 weeks.  [facility] CM Short Term Goal #2 Start Date  05/15/19  Interventions for Short Term Goal #2  Requested that pt look at the materials and focus on learning the COPD Action Plan so that she can automatically know when she needs to call for medical advise.     I will call her again in 1 weeks for follow up.  [name]. Myrtie Neither, MSN, [facility] Gerontological Nurse Practitioner [facility] Care Management [phone]

## 2019-05-23 NOTE — Telephone Encounter (Signed)
Patient daughter is returning the call. CB is [phone]

## 2019-05-24 NOTE — Telephone Encounter (Signed)
Patient is on Stiolto and in the coverage gap. Current copay is $110.78. Due to patient having Medicare, she will not be able to use manufacturer copay cards and internet coupons will likely be more expensive than her current copay. Patient has also been referred to [facility].

## 2019-05-24 NOTE — Telephone Encounter (Signed)
Called and spoke to patient about her donut hole situation. I ran test claims for other similar meds and they all have basically the same $110 copay due to the donut hole.  Discussed the [facility] program with the patient, advised that they would review Medicare plans and find the one most suitable. But patient stated she did not want to change plans. Advised patient that [place] would just give her options and not make any changes.  Discussed patient assistance, patient voiced that she did not think she would qualify due to income. Advised patient that there is a referral to [facility] and that they will likely try to get her enrolled into patient assistance and that it is always best to try to apply. Patient will think about it and discuss at her next [place] on Tuesday 05/30/19. Sent patient [facility] office information. Patient states she will look for a coupon for her next refill to get her through until January, when her plan resets. Advised patient to reach out with any other questions.  11:41 AM Beatriz Chancellor, CPhT

## 2019-05-30 ENCOUNTER — Other Ambulatory Visit: Payer: Self-pay | Admitting: *Deleted

## 2019-05-30 NOTE — Patient Outreach (Signed)
Telephone outreach. Unsuccessful. Left message and requested a return call.  [name]. Myrtie Neither, MSN, GNP-BC Gerontological Nurse Practitioner [facility] Care Management [phone]  Mrs. Schuman returned my call. She is doing remarkably well. She has met all her care management goals and feels like she can handle her own disease management well at this time.  Advised I am available if she needs me in the future.  [facility] CM Care Plan Problem One     Most Recent Value  Care Plan Problem One  COPD  (Pended)   Role Documenting the Problem One  Care Management Coordinator  (Pended)   Care Plan for Problem One  Active  (Pended)   [facility] Long Term Goal   Pt will not readmit for COPD in the next 31 days.  (Pended)   [facility] Long Term Goal Start Date  05/15/19  (Pended)   [facility] CM Short Term Goal #1   Pt to follow up with pulmonologist in the next 2 weeks.  (Pended)   [facility] CM Short Term Goal #1 Start Date  05/15/19  (Pended)   [facility] CM Short Term Goal #1 Met Date  05/23/19  (Pended)   [facility] CM Short Term Goal #2   Pt will recieve and review the COPD Action plan in the next 2 weeks.  (Pended)   [facility] CM Short Term Goal #2 Start Date  05/15/19  (Pended)        Kayleen Memos C. Myrtie Neither, MSN, [facility] Gerontological Nurse Practitioner [facility] Care Management [phone]

## 2019-05-31 ENCOUNTER — Other Ambulatory Visit: Payer: Self-pay | Admitting: *Deleted

## 2019-05-31 ENCOUNTER — Encounter: Payer: Self-pay | Admitting: *Deleted

## 2019-06-01 NOTE — Patient Outreach (Signed)
Telephone outreach - return call from Mrs. Aughenbaugh. She reports she is doing exceptionally well. She is working hard to improve her overall general health and physical endurance.  She follows her COPD Action Plan.  She feels confident is managing her chronic illness now. We decided together to close her case. She knows she can call me in the future should she have questions or medical problems she would like to discuss.  She has met all her care plan goals.   [name]. Myrtie Neither, MSN, [facility] Gerontological Nurse Practitioner [facility] Care Management [phone]

## 2019-06-05 ENCOUNTER — Telehealth: Payer: Self-pay | Admitting: Cardiovascular Disease

## 2019-06-05 DIAGNOSIS — F410 Panic disorder [episodic paroxysmal anxiety] without agoraphobia: Secondary | ICD-10-CM | POA: Diagnosis not present

## 2019-06-05 DIAGNOSIS — I4892 Unspecified atrial flutter: Secondary | ICD-10-CM | POA: Diagnosis not present

## 2019-06-05 DIAGNOSIS — M5090 Cervical disc disorder, unspecified, unspecified cervical region: Secondary | ICD-10-CM | POA: Diagnosis not present

## 2019-06-05 DIAGNOSIS — J439 Emphysema, unspecified: Secondary | ICD-10-CM | POA: Diagnosis not present

## 2019-06-05 DIAGNOSIS — N3281 Overactive bladder: Secondary | ICD-10-CM | POA: Diagnosis not present

## 2019-06-05 DIAGNOSIS — J9621 Acute and chronic respiratory failure with hypoxia: Secondary | ICD-10-CM | POA: Diagnosis not present

## 2019-06-05 DIAGNOSIS — E785 Hyperlipidemia, unspecified: Secondary | ICD-10-CM | POA: Diagnosis not present

## 2019-06-05 DIAGNOSIS — G43909 Migraine, unspecified, not intractable, without status migrainosus: Secondary | ICD-10-CM | POA: Diagnosis not present

## 2019-06-05 DIAGNOSIS — Z9981 Dependence on supplemental oxygen: Secondary | ICD-10-CM | POA: Diagnosis not present

## 2019-06-05 DIAGNOSIS — I10 Essential (primary) hypertension: Secondary | ICD-10-CM | POA: Diagnosis not present

## 2019-06-05 DIAGNOSIS — I2584 Coronary atherosclerosis due to calcified coronary lesion: Secondary | ICD-10-CM | POA: Diagnosis not present

## 2019-06-05 DIAGNOSIS — Z85828 Personal history of other malignant neoplasm of skin: Secondary | ICD-10-CM | POA: Diagnosis not present

## 2019-06-05 DIAGNOSIS — Z87891 Personal history of nicotine dependence: Secondary | ICD-10-CM | POA: Diagnosis not present

## 2019-06-05 DIAGNOSIS — M8580 Other specified disorders of bone density and structure, unspecified site: Secondary | ICD-10-CM | POA: Diagnosis not present

## 2019-06-05 DIAGNOSIS — I480 Paroxysmal atrial fibrillation: Secondary | ICD-10-CM | POA: Diagnosis not present

## 2019-06-05 DIAGNOSIS — F419 Anxiety disorder, unspecified: Secondary | ICD-10-CM | POA: Diagnosis not present

## 2019-06-05 DIAGNOSIS — M5136 Other intervertebral disc degeneration, lumbar region: Secondary | ICD-10-CM | POA: Diagnosis not present

## 2019-06-05 DIAGNOSIS — Z7901 Long term (current) use of anticoagulants: Secondary | ICD-10-CM | POA: Diagnosis not present

## 2019-06-05 DIAGNOSIS — J309 Allergic rhinitis, unspecified: Secondary | ICD-10-CM | POA: Diagnosis not present

## 2019-06-05 NOTE — Telephone Encounter (Signed)
New message    Pt c/o swelling: STAT is pt has developed SOB within 24 hours    1) How much weight have you gained and in what time span? N/A per daughter  2) If swelling, where is the swelling located? Ankles, feet  3) Are you currently taking a fluid pill? Unsure Per daughter 4) Are you currently SOB? COPD  Do you have a log of your daily weights (if so, list)?  5) Have you gained 3 pounds in a day or 5 pounds in a week?   6) Have you traveled recently? no

## 2019-06-05 NOTE — Telephone Encounter (Signed)
Ariel Gonzalez has had progressive swelling in her legs She is on amlodine 5 mg a day and Diltiazem 120 CD a day She was hospitalized for COPD exacerbation a month ago  Is not very mobile  We will DC amlodipine Start Valsartan 80 mg a day  Lasix 20 mg a day as needed Kdur 10 meq a day with the furosemide She will take a Furosemide 20 mg tab and Kdur 10 meq for the next 2 days to help with diuresis  I will see her in 9 days for follow up BMP at that time

## 2019-06-05 NOTE — Telephone Encounter (Signed)
Returned call to patient's daughter, [name]. She states she is concerned about bilateral foot swelling that started a few days ago. [name] states patient was doing foot exercises prescribed by PT and felt pain with the movement of the foot, looked down and noticed that they were swollen. States patient is concerned about weight gain in the mid-section and lower extremities sine recent hospitalization. I asked about diet and patient does not restrict sodium but does not eat a large amount of food per daughter. She is elevating her feet on a stool because she does sit a large amount of time due to COPD. I advised [name] that patient's EF was normal per echo on 04/04/19 but that Dr. Acie Fredrickson may want to do a trial of furosemide and I explained how that medication works. Sandy states Dr. Marisue Humble advised her to ask Dr. Acie Fredrickson if the swelling was a side effect of Eliquis or Diltiazem. I advised that it is more likely with ditliazem and that I will forward message to Dr. Acie Fredrickson for advice. Sandy verbalized understanding and agreement and thanked me for the call.

## 2019-06-06 ENCOUNTER — Telehealth: Payer: Self-pay | Admitting: Emergency Medicine

## 2019-06-06 DIAGNOSIS — J449 Chronic obstructive pulmonary disease, unspecified: Secondary | ICD-10-CM

## 2019-06-06 MED ORDER — FUROSEMIDE 20 MG PO TABS: 20.0000 mg | ORAL_TABLET | Freq: Every day | ORAL | 11 refills | Status: DC | PRN

## 2019-06-06 MED ORDER — BUDESONIDE 0.5 MG/2ML IN SUSP: 0.5000 mg | Freq: Two times a day (BID) | RESPIRATORY_TRACT | 2 refills | Status: DC

## 2019-06-06 MED ORDER — VALSARTAN 80 MG PO TABS: 80.0000 mg | ORAL_TABLET | Freq: Every day | ORAL | 11 refills | Status: DC

## 2019-06-06 MED ORDER — POTASSIUM CHLORIDE ER 10 MEQ PO TBCR: 10.0000 meq | EXTENDED_RELEASE_TABLET | Freq: Every day | ORAL | 11 refills | Status: DC | PRN

## 2019-06-06 NOTE — Telephone Encounter (Signed)
Spoke with sandy, advised her that I sent the Rx for pulmicort to her pharmacy. Nothing further is needed.

## 2019-06-06 NOTE — Telephone Encounter (Signed)
Med list updated and new prescriptions called in.

## 2019-06-07 ENCOUNTER — Telehealth: Payer: Self-pay | Admitting: Cardiovascular Disease

## 2019-06-07 MED ORDER — FUROSEMIDE 20 MG PO TABS: 40.0000 mg | ORAL_TABLET | Freq: Every day | ORAL | 3 refills | Status: DC

## 2019-06-07 MED ORDER — POTASSIUM CHLORIDE ER 10 MEQ PO TBCR: 20.0000 meq | EXTENDED_RELEASE_TABLET | Freq: Every day | ORAL | 3 refills | Status: DC

## 2019-06-07 NOTE — Telephone Encounter (Signed)
   Phone call with Ariel Gonzalez, Ariel Drivers  WE  Blannie Gonzalez on Lasix and potassium several days ago.  We also discontinued the amlodipine and started her on losartan for leg edema.  For the past 2 days she really has not had much urine output despite being on Lasix.  We will increase the Lasix to 40 mg a day.  We will increase the potassium chloride to 20 mEq a day. I sent her information on the lounge doctor leg rest. She will contact me in 2 days if her mother still does not have significant urine output. Will consider starting torsemide 20 mg once or twice a day at that time.     Mertie Moores, MD  06/07/2019 1:10 PM    [facility] [place],  [address] [place], [place]  [address] Phone: [phone]; Fax: [phone]

## 2019-06-07 NOTE — Telephone Encounter (Signed)
Medication list updated.

## 2019-06-13 ENCOUNTER — Encounter: Payer: Self-pay | Admitting: Emergency Medicine

## 2019-06-13 ENCOUNTER — Ambulatory Visit (INDEPENDENT_AMBULATORY_CARE_PROVIDER_SITE_OTHER): Payer: Medicare Other | Admitting: Emergency Medicine

## 2019-06-13 ENCOUNTER — Other Ambulatory Visit: Payer: Self-pay

## 2019-06-13 DIAGNOSIS — J449 Chronic obstructive pulmonary disease, unspecified: Secondary | ICD-10-CM | POA: Diagnosis not present

## 2019-06-13 MED ORDER — PREDNISONE 10 MG PO TABS: 10.0000 mg | ORAL_TABLET | Freq: Every day | ORAL | 3 refills | Status: DC

## 2019-06-13 NOTE — Progress Notes (Signed)
Virtual Visit via Telephone Note  I connected with Ariel Gonzalez on 06/13/19 at  4:00 PM EST by telephone and verified that I am speaking with the correct person using two identifiers.  Location: Patient: Home Provider: Office   I discussed the limitations, risks, security and privacy concerns of performing an evaluation and management service by telephone and the availability of in person appointments. I also discussed with the patient that there may be a patient responsible charge related to this service. The patient expressed understanding and agreed to proceed.   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   Observations/Objective: Current medications Stiolto, Pulmicort nebs, Singulair, albuterol prn.  She is on oxygen at 3 L/min. She had A fib + RVR and had to go to be admitted 04/2019 She reports that her breathing is stable - has ups and downs. She is using her o2 reliably, does see some desats when she is without it. She has occasional paroxysms of cough, can be severe and seem to happen more at night.  She had an episode of LE edema last week, changed amlodipine to valsartan, started lasix + potassium   Assessment and Plan: Discussed with patient and her daughter Ariel Gonzalez the pros / cons of adding low dose prednisone. Acknowledge that this could make edema, A fib more challenging to manage. Will plan to start 10mg  daily for the next month, see how she benefits. Continue the rest of her regimen as ordered.   Follow Up Instructions: Follow up by phone 1 month to assess change on the pred   I discussed the assessment and treatment plan with the patient. The patient was provided an opportunity to ask questions and all were answered. The patient agreed with the plan and demonstrated an understanding of the instructions.   The patient was advised to call back or seek an in-person evaluation if the symptoms worsen or  if the condition fails to improve as anticipated.  I provided 22 minutes of non-face-to-face time during this encounter.   Collene Gobble, MD

## 2019-06-13 NOTE — Addendum Note (Signed)
Addended by: Jannette Spanner on: 06/13/2019 04:04 PM   Modules accepted: Orders

## 2019-06-15 ENCOUNTER — Ambulatory Visit: Payer: Medicare Other | Admitting: Cardiovascular Disease

## 2019-06-15 ENCOUNTER — Telehealth: Payer: Self-pay | Admitting: Emergency Medicine

## 2019-06-15 ENCOUNTER — Ambulatory Visit (INDEPENDENT_AMBULATORY_CARE_PROVIDER_SITE_OTHER): Payer: Medicare Other | Admitting: Cardiovascular Disease

## 2019-06-15 ENCOUNTER — Encounter: Payer: Self-pay | Admitting: Cardiovascular Disease

## 2019-06-15 ENCOUNTER — Other Ambulatory Visit: Payer: Self-pay

## 2019-06-15 VITALS — BP 112/58 | HR 90 | Ht 66.0 in | Wt 151.0 lb

## 2019-06-15 DIAGNOSIS — I480 Paroxysmal atrial fibrillation: Secondary | ICD-10-CM

## 2019-06-15 DIAGNOSIS — I2584 Coronary atherosclerosis due to calcified coronary lesion: Secondary | ICD-10-CM

## 2019-06-15 DIAGNOSIS — J441 Chronic obstructive pulmonary disease with (acute) exacerbation: Secondary | ICD-10-CM

## 2019-06-15 DIAGNOSIS — I2510 Atherosclerotic heart disease of native coronary artery without angina pectoris: Secondary | ICD-10-CM

## 2019-06-15 DIAGNOSIS — J449 Chronic obstructive pulmonary disease, unspecified: Secondary | ICD-10-CM

## 2019-06-15 MED ORDER — DOXYCYCLINE HYCLATE 100 MG PO CAPS: ORAL_CAPSULE | ORAL | 0 refills | Status: DC

## 2019-06-15 MED ORDER — PREDNISONE 10 MG PO TABS: ORAL_TABLET | ORAL | 1 refills | Status: DC

## 2019-06-15 NOTE — Progress Notes (Signed)
Cardiology Office Note   Date:  06/15/2019   ID:  Ariel Gonzalez, DOB 1940/08/07, MRN [id number]  PCP:  Gaynelle Arabian, MD  Cardiologist:    Mertie Moores, MD   Chief Complaint  Patient presents with  . Atrial Fibrillation   Problem List 1. COPD 2. Coronary Artery Calcification 3.  Paroxysmal Atrial fib   Previous notes.  Ariel Gonzalez is a 78 y.o. female who presents for coronary artery calcifications . Mother of [name] .  Records from Dr. Marisue Humble were reviewed.  She has a history of smoking. A CT scan was recently performed to evaluate her for the possibility of lung cancer. She was found have coronary artery calcifications.  Has had a chronic cough.  Does not exercise  - has COPD  Uses home O2 at times - especially after being in the shower.   Stays active.  No syncope.  Lives at the [place] in [place], laundry, shopping - never has CP .  stopped smoking - 2 years   12/18/2015: [name]  presents today for follow-up of her coronary calcifications. She's had a stress Myoview study that was low risk and she has no ischemia. She has normal left trigger systolic function with an ejection fraction of 66%.  No CP , chronic COPD - unable to walk  April 14, 2019:  And is seen today after 3-year absence.   Ariel Gonzalez's mother )   We were asked to see her by Dr. Marisue Humble for further eval and management of her paroxysmal atrial fib.   She has a history of COPD.  She was at pulmonary rehab and the rehab nurse noted an elevated heart rate.  She was asymptomatic.  She was sent to her PCP and she was confirmed to have atrial fibrillation.  Her heart rate was 140.  She converted to sinus rhythm with a Cardizem bolus.  She was started on Eliquis 5 mg twice a day and metoprolol 25 mg twice a day with follow-up in the A. fib clinic.  She was seen by [name] on September 14.  She was in sinus rhythm at that time.  Sees [name] for pulmonary .  Developed a  cough this past month .  Was wheezing   Started using a nebulizer which has helped   Echo from Sept. 20, 2020 .  Shows normal LV systolic function Mild diastolic dysfunction  June 15, 2019:  [name] is seen today for follow-up visit.  She is had lots of leg edema that has improved with increasing Lasix doses and using the lounge doctor leg rest.  He continues to have severe shortness of breath related to her COPD.  Saw R.  Byrum 2 days ago (virtual visit )  Added prednisone 10 mg a day   Leg edema has resolved.  Back to Lasix 40 mg a day , Kdur 10 meq a day  Is using the Lounge Doctor regularly . Is still wheezing quite a bit   Past Medical History:  Diagnosis Date  . Allergic rhinitis   . Arthritis    CERVICAL AND LUMBER DDD  . COPD (chronic obstructive pulmonary disease) ([place])    HFA 75% p coaching July 17, 2010  . HTN (hypertension)   . Migraine   . Osteopenia   . Overactive bladder   . Panic attacks   . Squamous cell carcinoma of skin     Past Surgical History:  Procedure Laterality Date  . BETATCHED  RETINA  2010  . CATARACTS Bilateral 2009  . CHOLECYSTECTOMY    . JAW BONE GRAFT  2009  . TOTAL ABDOMINAL HYSTERECTOMY W/ BILATERAL SALPINGOOPHORECTOMY       Current Outpatient Medications  Medication Sig Dispense Refill  . albuterol (PROVENTIL) (2.5 MG/3ML) 0.083% nebulizer solution Take 3 mLs (2.5 mg total) by nebulization every 6 (six) hours as needed for wheezing or shortness of breath. (Patient taking differently: Take 2.5 mg by nebulization 2 (two) times daily as needed for wheezing or shortness of breath. ) 360 mL 3  . apixaban (ELIQUIS) 5 MG TABS tablet Take 1 tablet (5 mg total) by mouth 2 (two) times daily. 60 tablet 0  . atorvastatin (LIPITOR) 40 MG tablet TAKE 1 TABLET BY MOUTH ONCE DAILY 30 tablet 0  . budesonide (PULMICORT) 0.5 MG/2ML nebulizer solution Take 2 mLs (0.5 mg total) by nebulization 2 (two) times daily. 120 mL 2  . buPROPion (WELLBUTRIN XL)  150 MG 24 hr tablet Take 1 tablet by mouth daily.    . calcium carbonate (OS-CAL) 600 MG TABS Take 600 mg by mouth daily.      . Cholecalciferol (PA VITAMIN D-3) 2000 UNITS CAPS Take 2,000 Units by mouth daily.     . citalopram (CELEXA) 20 MG tablet Take 40 mg by mouth daily.     . clonazePAM (KLONOPIN) 1 MG tablet Half tablet to whole tablet twice a day for anxiety    . diltiazem (CARDIZEM CD) 120 MG 24 hr capsule Take 1 capsule (120 mg total) by mouth daily. 90 capsule 3  . furosemide (LASIX) 20 MG tablet Take 2 tablets (40 mg total) by mouth daily. 180 tablet 3  . montelukast (SINGULAIR) 10 MG tablet Take 1 tablet (10 mg total) by mouth at bedtime. 30 tablet 3  . Multiple Vitamin (MULTIVITAMIN) tablet Take 1 tablet by mouth daily.      . OXYGEN Inhale 4 L into the lungs continuous.     . potassium chloride (KLOR-CON) 10 MEQ tablet Take 2 tablets (20 mEq total) by mouth daily. 180 tablet 3  . Tiotropium Bromide-Olodaterol (STIOLTO RESPIMAT) 2.5-2.5 MCG/ACT AERS Inhale 2 puffs into the lungs daily. 4 g 3  . valsartan (DIOVAN) 80 MG tablet Take 1 tablet (80 mg total) by mouth daily. 30 tablet 11  . VENTOLIN HFA 108 (90 Base) MCG/ACT inhaler INHALE 2 PUFFS EVERY 4 HOURS AS NEEDED 54 g 1   No current facility-administered medications for this visit.     Allergies:   Cefaclor, Clarithromycin, Erythromycin, and Iodine    Social History:  The patient  reports that she quit smoking about 11 years ago. Her smoking use included cigarettes. She has a 50.00 pack-year smoking history. She has never used smokeless tobacco. She reports that she does not drink alcohol or use drugs.   Family History:  The patient's family history includes Breast cancer in her mother and sister; Cancer in her mother and sister; Coronary artery disease in her father; Heart attack in her father.   Father died at age 38 due to MI .    ROS:  Please see the history of present illness.     Physical Exam: Blood pressure (!)  112/58, pulse 90, height 5\' 6"  (1.676 m), weight 151 lb (68.5 kg), SpO2 99 %.  GEN:  Chronically ill appearing. ,  Struggling to breath at times  HEENT: Normal NECK: No JVD; No carotid bruits LYMPHATICS: No lymphadenopathy CARDIAC: RRR,  RESPIRATORY:  Severe wheezing bilaterally  ABDOMEN: Soft, non-tender, non-distended MUSCULOSKELETAL:  No edema; No deformity  SKIN: Warm and dry NEUROLOGIC:  Alert and oriented x 3    EKG:   Recent Labs: 04/26/2019: ALT 26; B Natriuretic Peptide 145.9 04/27/2019: TSH 0.311 05/02/2019: BUN 20; Creatinine, Ser 0.61; Hemoglobin 12.9; Platelets 246; Potassium 4.2; Sodium 139    Lipid Panel    Component Value Date/Time   CHOL 137 12/12/2015 0919   TRIG 75 12/12/2015 0919   HDL 86 12/12/2015 0919   CHOLHDL 1.6 12/12/2015 0919   VLDL 15 12/12/2015 0919   LDLCALC 36 12/12/2015 0919     Labs from [place] office shows an LDL of 135, total of 230   Wt Readings from Last 3 Encounters:  06/15/19 151 lb (68.5 kg)  05/10/19 145 lb (65.8 kg)  04/27/19 145 lb 11.6 oz (66.1 kg)      Other studies Reviewed: Additional studies/ records that were reviewed today include: . Review of the above records demonstrates:    ASSESSMENT AND PLAN:  1.  Coronary artery calcifications No angina .    2. Hyperlipidemia:   LDL is 135.   Has evidence of CAD .   Goal is 70  She is been on atorvastatin. Cholesterol levels from June, 2017 reveal a total cholesterol of 137.  HDL is 86.  LDL is 36.   3.  Atrial fibrillation:  Has PAF.   Seems to be in sinus rhythm currently   4. COPD :   Has severe COPD .  Is really struggling to breath   Discussed the situation with Dr. Lamonte Sakai.  We will place her on a steroid taper.  We will start at  40 mg for 4 days 30 mg for 4 days 20 mg for 4 days 10 mg until otherwise directed by Dr. Lamonte Sakai. We will also start on doxycycline 100 mg twice a day for 7 days.  She has some allergies to clarithromycin.  Current medicines are  reviewed at length with the patient today.  The patient does not have concerns regarding medicines.  The following changes have been made:  no change  Labs/ tests ordered today include:   Orders Placed This Encounter  Procedures  . Basic Metabolic Panel (BMET)     Disposition:  Office visit in     Mertie Moores, MD  06/15/2019 12:21 PM    [facility] [place], [place], [place]  [address] Phone: [phone]; Fax: [phone]

## 2019-06-15 NOTE — Telephone Encounter (Signed)
Pt returning call.  [phone].

## 2019-06-15 NOTE — Patient Instructions (Addendum)
Medication Instructions:  We have ordered Prednisone and Doxycycline per Dr. Lamonte Sakai Take Prednisone: 40 mg for 4 days 30 mg for 4 days 20 mg for 4 days Then take 10 mg daily until Dr. Lamonte Sakai tells you otherwise  Take Doxycyline 100 mg twice daily for 7 days Your physician recommends that you continue on your current medications as directed. Please refer to the Current Medication list given to you today.  *If you need a refill on your cardiac medications before your next appointment, please call your pharmacy*  Lab Work: TODAY - basic metabolic panel If you have labs (blood work) drawn today and your tests are completely normal, you will receive your results only by: Marland Kitchen MyChart Message (if you have MyChart) OR . A paper copy in the mail If you have any lab test that is abnormal or we need to change your treatment, we will call you to review the results.   Testing/Procedures: None Ordered   Follow-Up: At [facility], you and your health needs are our priority.  As part of our continuing mission to provide you with exceptional heart care, we have created designated Provider Care Teams.  These Care Teams include your primary Cardiologist (physician) and Advanced Practice Providers (APPs -  Physician Assistants and Nurse Practitioners) who all work together to provide you with the care you need, when you need it.  Your next appointment:   6 month(s)  The format for your next appointment:   In Person  Provider:   You may see Mertie Moores, MD or one of the following Advanced Practice Providers on your designated Care Team:    Richardson Dopp, PA-C  [place], [place]  Daune Perch, [place]

## 2019-06-15 NOTE — Telephone Encounter (Signed)
Spoke with patient. I reviewed RB's schedule and he did not have an opening until December 28th. Patient wanted to only f/u with RB. She has been scheduled for 12/28 at [place], you do have some held spots but these spots are for possibly lung nodule patients. Would it be ok to use one of these slots the week of 12/13?

## 2019-06-15 NOTE — Telephone Encounter (Signed)
Spoke with DR Nahser this afternoon. Ariel Gonzalez came to [facility] office today, has wheeze, significant increased WOB on his eval. Appears to be adequately saturated on her O2. I had just started her on pred 10mg  qd with plan to continue is she got benefit. Based on this exam, it sounds like she is experiencing an AE-COPD.   Dr Acie Fredrickson is starting her on an extended pred taper from 3 back down to the 10mg  we have just started. He is ordering doxycycline 100 bid x 7 d.   Please arrange for Ariel Gonzalez to have a tele-visit with either mee or APP in about 10 days to follow her progress. Thanks.

## 2019-06-15 NOTE — Telephone Encounter (Signed)
LMTCB

## 2019-06-16 LAB — BASIC METABOLIC PANEL
BUN/Creatinine Ratio: 23 (ref 12–28)
BUN: 17 mg/dL (ref 8–27)
CO2: 33 mmol/L — ABNORMAL HIGH (ref 20–29)
Calcium: 9.2 mg/dL (ref 8.7–10.3)
Chloride: 98 mmol/L (ref 96–106)
Creatinine, Ser: 0.75 mg/dL (ref 0.57–1.00)
GFR calc Af Amer: 88 mL/min/{1.73_m2} (ref >59)
GFR calc non Af Amer: 77 mL/min/{1.73_m2} (ref >59)
Glucose: 99 mg/dL (ref 65–99)
Potassium: 4.3 mmol/L (ref 3.5–5.2)
Sodium: 145 mmol/L — ABNORMAL HIGH (ref 134–144)

## 2019-06-16 NOTE — Telephone Encounter (Signed)
I called pt and advised her of RB message. Pt agreed to move up appt to 06/20/2019 at 4:30pm. Nothing further is needed.

## 2019-06-16 NOTE — Telephone Encounter (Signed)
I am OK to squeeze her into one of my blocked spots

## 2019-06-17 DIAGNOSIS — I10 Essential (primary) hypertension: Secondary | ICD-10-CM | POA: Diagnosis not present

## 2019-06-17 DIAGNOSIS — G43909 Migraine, unspecified, not intractable, without status migrainosus: Secondary | ICD-10-CM | POA: Diagnosis not present

## 2019-06-17 DIAGNOSIS — I480 Paroxysmal atrial fibrillation: Secondary | ICD-10-CM | POA: Diagnosis not present

## 2019-06-17 DIAGNOSIS — J439 Emphysema, unspecified: Secondary | ICD-10-CM | POA: Diagnosis not present

## 2019-06-17 DIAGNOSIS — I4892 Unspecified atrial flutter: Secondary | ICD-10-CM | POA: Diagnosis not present

## 2019-06-17 DIAGNOSIS — J9621 Acute and chronic respiratory failure with hypoxia: Secondary | ICD-10-CM | POA: Diagnosis not present

## 2019-06-20 ENCOUNTER — Other Ambulatory Visit: Payer: Self-pay

## 2019-06-20 ENCOUNTER — Ambulatory Visit (INDEPENDENT_AMBULATORY_CARE_PROVIDER_SITE_OTHER): Payer: Medicare Other | Admitting: Emergency Medicine

## 2019-06-20 ENCOUNTER — Encounter: Payer: Self-pay | Admitting: Emergency Medicine

## 2019-06-20 VITALS — BP 124/80 | HR 84 | Wt 151.0 lb

## 2019-06-20 DIAGNOSIS — J449 Chronic obstructive pulmonary disease, unspecified: Secondary | ICD-10-CM | POA: Diagnosis not present

## 2019-06-20 DIAGNOSIS — I2584 Coronary atherosclerosis due to calcified coronary lesion: Secondary | ICD-10-CM | POA: Diagnosis not present

## 2019-06-20 DIAGNOSIS — I2510 Atherosclerotic heart disease of native coronary artery without angina pectoris: Secondary | ICD-10-CM

## 2019-06-20 MED ORDER — YUPELRI 175 MCG/3ML IN SOLN: 3.0000 mL | Freq: Every day | RESPIRATORY_TRACT | 0 refills | Status: DC

## 2019-06-20 MED ORDER — ARFORMOTEROL TARTRATE 15 MCG/2ML IN NEBU: 15.0000 ug | INHALATION_SOLUTION | Freq: Two times a day (BID) | RESPIRATORY_TRACT | 12 refills | Status: AC

## 2019-06-20 NOTE — Assessment & Plan Note (Signed)
Based on her acute symptoms and work of breathing earlier this week I consulted with Dr. Acie Fredrickson and we have treated her for an acute exacerbation.  In retrospect her symptoms may be more subacute and progressive in nature.  I am concerned that she is approaching end-stage disease, will have symptoms even when maximally medically managed.  For now I will plan to taper her to 10 mg prednisone and keep her at that dose.  I think there may be some benefit to be had in changing her inhaled regimen to a fully nebulizer regimen for ease of delivery.  I talked to her today about the fact that at some point we may be headed for hospice care to manage symptoms.  She and her daughter Ariel Gonzalez understood.  Most importantly we talked today about goals for care.  I explained that I do not believe Ariel Gonzalez would survive or benefit from mechanical ventilation or invasive care should she develop a critical illness.  She has talked about this issue with family including Ariel Gonzalez.  [name] is clear that she would not want mechanical ventilation, would favor transition to comfort care if she had an acute decompensation.  Based on this I placed a DNR orders in her epic chart and also completed paper orders for her to take home and display.  40 minutes of a 55-minute visit were spent discussing her symptoms, potential therapies and her goals of care.   Please stop Stiolto for now. We will start Brovana nebulized twice a day. Continue Pulmicort nebulized twice a day. We will start Yupelri nebulized once a day (do this around the same time every day Complete your prednisone taper as ordered down to 10 mg daily and then stay on that dose going forward until we reassess. We talked today about your wishes and instructions for your care should you become acutely critically ill. I fully support and respect your wishes to concentrate on quality of life and to avoid invasive life-sustaining support. I will document your wishes in the  medical chart and send written orders with you to keep at home.  Continue your oxygen at all times as you have been using it. Continue your Lasix and potassium as directed by Dr. Acie Fredrickson. Follow with Dr Lamonte Sakai in 1 month with a telephone visit

## 2019-06-20 NOTE — Progress Notes (Signed)
History of Present Illness:   ROV 06/20/2019 --Ms. Minder is 84, has hypoxia respiratory failure in the setting of severe COPD.  She has benefited some from pulmonary rehab earlier in the year.  She unfortunately has had progression of her dyspnea and increased work of breathing.  I spoke with her earlier this month about going on chronic prednisone 10 mg daily.  She agreed to try this.  She was then seen by Dr. Acie Fredrickson in cardiology and was found to have significant increased work of breathing, wheezing, mild respiratory distress.  We treated her for an acute exacerbation with a prednisone taper with a plan to get back down to prednisone 10.  On antibiotics as well. She feels a bit better - less dyspnea at night, may have some improvement in her functional capacity. She is on Pulmicort nebs, also on Stiolto.    Vitals:   06/20/19 1643  BP: 124/80  Pulse: 84  SpO2: 93%  Weight: 151 lb (68.5 kg)   Gen: Pleasant, elderly  in no distress, a bit anxious  ENT: No lesions,  mouth clear,  oropharynx clear, no drainage, no upper airway obstruction  Neck: No JVD, no stridor  Lungs: Mild accessory muscle use, very distant breath sounds, prominent bilateral expiratory wheezes throughout the respiratory cycle.  Cardiovascular: RRR, heart sounds normal, no murmur or gallops, trace to 1+ ankle peripheral edema  Musculoskeletal: No deformities, no cyanosis or clubbing  Neuro: alert, non focal  Skin: Warm, no lesions or rashes   COPD (chronic obstructive pulmonary disease) (H[facility]) Based on her acute symptoms and work of breathing earlier this week I consulted with Dr. Acie Fredrickson and we have treated her for an acute exacerbation.  In retrospect her symptoms may be more subacute and progressive in nature.  I am concerned that she is approaching end-stage disease, will have symptoms even when maximally medically managed.  For now I will plan to taper her to 10 mg prednisone and keep her at that dose.  I think  there may be some benefit to be had in changing her inhaled regimen to a fully nebulizer regimen for ease of delivery.  I talked to her today about the fact that at some point we may be headed for hospice care to manage symptoms.  She and her daughter [name] understood.  Most importantly we talked today about goals for care.  I explained that I do not believe Ms. Bagdonas would survive or benefit from mechanical ventilation or invasive care should she develop a critical illness.  She has talked about this issue with family including Emmons.  Ms. Juliana is clear that she would not want mechanical ventilation, would favor transition to comfort care if she had an acute decompensation.  Based on this I placed a DNR orders in her epic chart and also completed paper orders for her to take home and display.  40 minutes of a 55-minute visit were spent discussing her symptoms, potential therapies and her goals of care.   Please stop Stiolto for now. We will start Brovana nebulized twice a day. Continue Pulmicort nebulized twice a day. We will start Yupelri nebulized once a day (do this around the same time every day Complete your prednisone taper as ordered down to 10 mg daily and then stay on that dose going forward until we reassess. We talked today about your wishes and instructions for your care should you become acutely critically ill. I fully support and respect your wishes to concentrate on quality  of life and to avoid invasive life-sustaining support. I will document your wishes in the medical chart and send written orders with you to keep at home.  Continue your oxygen at all times as you have been using it. Continue your Lasix and potassium as directed by Dr. Acie Fredrickson. Follow with Dr Lamonte Sakai in 1 month with a telephone visit    Baltazar Apo, MD, PhD 06/20/2019, 5:43 PM [facility] [phone] or if no answer [phone]

## 2019-06-20 NOTE — Patient Instructions (Signed)
Please stop Stiolto for now. We will start Brovana nebulized twice a day. Continue Pulmicort nebulized twice a day. We will start Yupelri nebulized once a day (do this around the same time every day Complete your prednisone taper as ordered down to 10 mg daily and then stay on that dose going forward until we reassess. We talked today about your wishes and instructions for your care should you become acutely critically ill. I fully support and respect your wishes to concentrate on quality of life and to avoid invasive life-sustaining support. I will document your wishes in the medical chart and send written orders with you to keep at home.  Continue your oxygen at all times as you have been using it. Continue your Lasix and potassium as directed by Dr. Acie Fredrickson. Follow with Dr Lamonte Sakai in 1 month with a telephone visit

## 2019-07-03 ENCOUNTER — Telehealth: Payer: Self-pay | Admitting: Emergency Medicine

## 2019-07-03 NOTE — Telephone Encounter (Signed)
Dr. Lamonte Sakai,  This patient was last seen by you 06/20/19 for COPD. The Celexa she is asking for has not been issued since 2016 according to the active medication list, and it was not issued by our office.   However on the discontinued list it shows a start dated of 04/27/19 and ended 05/03/19. Would a refill of this medication be appropriate.Thank you.

## 2019-07-04 MED ORDER — CITALOPRAM HYDROBROMIDE 20 MG PO TABS: 40.0000 mg | ORAL_TABLET | Freq: Every day | ORAL | 0 refills | Status: AC

## 2019-07-04 NOTE — Telephone Encounter (Addendum)
It seems like pt should possibly have refill of celexa come from PCP. Aaron Edelman, please advise if you think pt needs to call PCP to get a refill of this based on what Hinton Dyer has found out?

## 2019-07-04 NOTE — Telephone Encounter (Signed)
I don't recall writing this for her. I am OK giving her 1 month supply until she can talk to her PCP. Thanks.

## 2019-07-04 NOTE — Telephone Encounter (Signed)
Spoke with pt, aware that this is a 1-time refill and that future refills must come from her PCP.  Pt expressed understanding.  Nothing further needed at this time- will close encounter.

## 2019-07-10 ENCOUNTER — Ambulatory Visit: Payer: Medicare Other | Admitting: Emergency Medicine

## 2019-07-17 ENCOUNTER — Telehealth: Payer: Self-pay | Admitting: Emergency Medicine

## 2019-07-17 MED ORDER — YUPELRI 175 MCG/3ML IN SOLN: 3.0000 mL | Freq: Every day | RESPIRATORY_TRACT | 2 refills | Status: DC

## 2019-07-17 NOTE — Telephone Encounter (Signed)
Called and spoke with Patient. Patient stated she is needing refill for yupelri. Patient stated Maretta Bees has been working for her. Patient requested [employer]. Prescription sent to requested pharmacy.  Nothing further at this time.  Per 06/20/19 OV with RB-  Instructions  Please stop Stiolto for now. We will start Brovana nebulized twice a day. Continue Pulmicort nebulized twice a day. We will start Yupelri nebulized once a day (do this around the same time every day Complete your prednisone taper as ordered down to 10 mg daily and then stay on that dose going forward until we reassess. We talked today about your wishes and instructions for your care should you become acutely critically ill. I fully support and respect your wishes to concentrate on quality of life and to avoid invasive life-sustaining support. I will document your wishes in the medical chart and send written orders with you to keep at home.  Continue your oxygen at all times as you have been using it. Continue your Lasix and potassium as directed by Dr. Acie Fredrickson. Follow with Dr Lamonte Sakai in 1 month with a telephone visit

## 2019-07-20 ENCOUNTER — Telehealth: Payer: Self-pay | Admitting: Emergency Medicine

## 2019-07-20 MED ORDER — YUPELRI 175 MCG/3ML IN SOLN: 3.0000 mL | Freq: Every day | RESPIRATORY_TRACT | 0 refills | Status: DC

## 2019-07-20 MED ORDER — YUPELRI 175 MCG/3ML IN SOLN: 3.0000 mL | Freq: Every day | RESPIRATORY_TRACT | 2 refills | Status: DC

## 2019-07-20 NOTE — Telephone Encounter (Signed)
error 

## 2019-07-20 NOTE — Telephone Encounter (Signed)
Spoke with Ariel Gonzalez and she states the [place] is 1300 dollars at [employer]. I advised her that I could send the Rx into [facility] pharmacy to see if they would cover it at a cheaper price. I sent Rx to their pharmacy and she will contact us to let us know. I left samples downstairs for her to pick up because she stated that the patient was out of medication. She will come by to pick them up. Samples left downstairs and nothing further is needed.

## 2019-07-24 ENCOUNTER — Encounter: Payer: Self-pay | Admitting: Emergency Medicine

## 2019-07-24 ENCOUNTER — Other Ambulatory Visit: Payer: Self-pay

## 2019-07-24 ENCOUNTER — Ambulatory Visit (INDEPENDENT_AMBULATORY_CARE_PROVIDER_SITE_OTHER): Payer: Medicare Other | Admitting: Emergency Medicine

## 2019-07-24 DIAGNOSIS — J449 Chronic obstructive pulmonary disease, unspecified: Secondary | ICD-10-CM | POA: Diagnosis not present

## 2019-07-24 NOTE — Assessment & Plan Note (Signed)
Severe COPD.  At her last visit we changed both her bronchodilator regimen and also added prednisone.  Difficult to say which has been the most impactful.  She may have benefited from both.  Ariel Gonzalez is very expensive and may not be possible.  We are waiting to see what the cost is going to be through the [place].  If it is not affordable then I can change her to Spiriva Respimat, continue the Brovana and Pulmicort nebs.  Also continue prednisone 10 mg daily.  I plan to follow-up with her in 1 month to assess her status.  She needs the COVID-19 vaccine and we talked about this today.  We will work on getting her the scheduling information.

## 2019-07-24 NOTE — Progress Notes (Signed)
Virtual Visit via Telephone Note  I connected with Ariel Gonzalez on 07/24/19 at  3:45 PM EST by telephone and verified that I am speaking with the correct person using two identifiers.  Location: Patient: Home Provider: Office   I discussed the limitations, risks, security and privacy concerns of performing an evaluation and management service by telephone and the availability of in person appointments. I also discussed with the patient that there may be a patient responsible charge related to this service. The patient expressed understanding and agreed to proceed.   History of Present Illness: 79 year old woman who follows up for very severe COPD with associated hypoxemic respiratory failure, approaching end-stage.  At her last visit we agreed to continue prednisone 10 mg, also talked about the potential benefit going forward of hospice care, palliative care.  I tried changing her bronchodilator regimen from Stiolto to nebulized regimen: Brovana, Pulmicort, Yupelri.  She also remains on Singulair, uses albuterol approximately   Observations/Objective: Patient reports that she did get benefit from the [place] combination but that the Maretta Bees is profoundly expensive ($1300). She does feel better - less cough or sneezing. A bit more energy, able to do slightly more. Less dyspnea. She is using flutter prn through the day.   Assessment and Plan: COPD (chronic obstructive pulmonary disease) (HCC) Severe COPD.  At her last visit we changed both her bronchodilator regimen and also added prednisone.  Difficult to say which has been the most impactful.  She may have benefited from both.  Maretta Bees is very expensive and may not be possible.  We are waiting to see what the cost is going to be through the [place].  If it is not affordable then I can change her to Spiriva Respimat, continue the Brovana and Pulmicort nebs.  Also continue prednisone 10 mg daily.  I plan to follow-up with her in 1  month to assess her status.  She needs the COVID-19 vaccine and we talked about this today.  We will work on getting her the scheduling information.    Follow Up Instructions: 1 month by phone   I discussed the assessment and treatment plan with the patient. The patient was provided an opportunity to ask questions and all were answered. The patient agreed with the plan and demonstrated an understanding of the instructions.   The patient was advised to call back or seek an in-person evaluation if the symptoms worsen or if the condition fails to improve as anticipated.  I provided 19 minutes of non-face-to-face time during this encounter.   Collene Gobble, MD

## 2019-07-25 ENCOUNTER — Other Ambulatory Visit: Payer: Self-pay | Admitting: *Deleted

## 2019-07-25 MED ORDER — YUPELRI 175 MCG/3ML IN SOLN: 3.0000 mL | Freq: Every day | RESPIRATORY_TRACT | 11 refills | Status: DC

## 2019-07-27 ENCOUNTER — Ambulatory Visit: Payer: Medicare Other | Attending: Internal Medicine

## 2019-07-27 ENCOUNTER — Telehealth: Payer: Self-pay | Admitting: Emergency Medicine

## 2019-07-27 DIAGNOSIS — J449 Chronic obstructive pulmonary disease, unspecified: Secondary | ICD-10-CM

## 2019-07-27 DIAGNOSIS — Z23 Encounter for immunization: Secondary | ICD-10-CM | POA: Diagnosis not present

## 2019-07-27 MED ORDER — YUPELRI 175 MCG/3ML IN SOLN: 3.0000 mL | Freq: Every day | RESPIRATORY_TRACT | 11 refills | Status: AC

## 2019-07-27 MED ORDER — BUDESONIDE 0.5 MG/2ML IN SUSP: 0.5000 mg | Freq: Two times a day (BID) | RESPIRATORY_TRACT | 5 refills | Status: DC

## 2019-07-27 NOTE — Telephone Encounter (Signed)
pt needs to know about yupelri - is she supposed to follow up on somehting regarding this medication or will we be doing somehting with the RX to [place] - also on pulmiocort the insurance does not like the number of refills that are on the RX. They will not fill the prescription and nobody knows why-  Please advise - she is out of Pulmicort in 3 days

## 2019-07-27 NOTE — Telephone Encounter (Signed)
Called and spoke to pt. Pt states she hasnt heard from [place] about the price of the [place]. Per pt's chart the Maretta Bees was sent to the wrong pharmacy. I have sent it to [facility] as requested. Pt also states [facility] [place] had issues with her Pulmicort. Called [facility], they needed a new rx. A new Pulmicort rx has been sent to [facility]. Pt verbalized understanding and states she will reach out tomorrow mid day if she hasnt heard from [place] about pricing her Yupelri. Will keep message open to follow up on.

## 2019-07-27 NOTE — Progress Notes (Signed)
   Covid-19 Vaccination Clinic  Name:  Ariel Gonzalez    MRN: [id number] DOB: Feb 22, 1941  07/27/2019  [name] was observed post Covid-19 immunization for 15 minutes without incidence. She was provided with Vaccine Information Sheet and instruction to access the V-Safe system.   [name] was instructed to call 911 with any severe reactions post vaccine: Marland Kitchen Difficulty breathing  . Swelling of your face and throat  . A fast heartbeat  . A bad rash all over your body  . Dizziness and weakness    Immunizations Administered    Name Date Dose VIS Date Route   Pfizer COVID-19 Vaccine 07/27/2019 10:03 AM 0.3 mL 06/23/2019 Intramuscular   Manufacturer: [employer], [employer]   Lot: [id number]   [place]: [id number]

## 2019-07-31 NOTE — Telephone Encounter (Signed)
[place] and spoke with Levada Dy. She states that they are needing progress notes in order to file her insurance for [place]. These notes have been faxed to [phone].  Spoke with Ariel Gonzalez. I have made her aware of this information. Nothing further was needed at this time.

## 2019-07-31 NOTE — Telephone Encounter (Signed)
ATC pt, no answer. Left message for pt to call back.  

## 2019-07-31 NOTE — Telephone Encounter (Signed)
Pt called back and states she has not heard from [place] regarding the price of her Yupelri. [facility] will need to be called. Maretta Bees was sent on 1.14.21.

## 2019-08-02 DIAGNOSIS — Z0000 Encounter for general adult medical examination without abnormal findings: Secondary | ICD-10-CM | POA: Diagnosis not present

## 2019-08-02 DIAGNOSIS — J449 Chronic obstructive pulmonary disease, unspecified: Secondary | ICD-10-CM | POA: Diagnosis not present

## 2019-08-02 DIAGNOSIS — F410 Panic disorder [episodic paroxysmal anxiety] without agoraphobia: Secondary | ICD-10-CM | POA: Diagnosis not present

## 2019-08-02 DIAGNOSIS — Z1389 Encounter for screening for other disorder: Secondary | ICD-10-CM | POA: Diagnosis not present

## 2019-08-02 DIAGNOSIS — I4891 Unspecified atrial fibrillation: Secondary | ICD-10-CM | POA: Diagnosis not present

## 2019-08-02 DIAGNOSIS — F39 Unspecified mood [affective] disorder: Secondary | ICD-10-CM | POA: Diagnosis not present

## 2019-08-02 DIAGNOSIS — I10 Essential (primary) hypertension: Secondary | ICD-10-CM | POA: Diagnosis not present

## 2019-08-02 DIAGNOSIS — E559 Vitamin D deficiency, unspecified: Secondary | ICD-10-CM | POA: Diagnosis not present

## 2019-08-02 DIAGNOSIS — F331 Major depressive disorder, recurrent, moderate: Secondary | ICD-10-CM | POA: Diagnosis not present

## 2019-08-02 DIAGNOSIS — E785 Hyperlipidemia, unspecified: Secondary | ICD-10-CM | POA: Diagnosis not present

## 2019-08-02 DIAGNOSIS — I700 Atherosclerosis of aorta: Secondary | ICD-10-CM | POA: Diagnosis not present

## 2019-08-07 DIAGNOSIS — E785 Hyperlipidemia, unspecified: Secondary | ICD-10-CM | POA: Diagnosis not present

## 2019-08-07 DIAGNOSIS — I2510 Atherosclerotic heart disease of native coronary artery without angina pectoris: Secondary | ICD-10-CM | POA: Diagnosis not present

## 2019-08-07 DIAGNOSIS — F331 Major depressive disorder, recurrent, moderate: Secondary | ICD-10-CM | POA: Diagnosis not present

## 2019-08-07 DIAGNOSIS — E7800 Pure hypercholesterolemia, unspecified: Secondary | ICD-10-CM | POA: Diagnosis not present

## 2019-08-07 DIAGNOSIS — I4891 Unspecified atrial fibrillation: Secondary | ICD-10-CM | POA: Diagnosis not present

## 2019-08-07 DIAGNOSIS — M8580 Other specified disorders of bone density and structure, unspecified site: Secondary | ICD-10-CM | POA: Diagnosis not present

## 2019-08-07 DIAGNOSIS — I10 Essential (primary) hypertension: Secondary | ICD-10-CM | POA: Diagnosis not present

## 2019-08-07 DIAGNOSIS — J449 Chronic obstructive pulmonary disease, unspecified: Secondary | ICD-10-CM | POA: Diagnosis not present

## 2019-08-14 ENCOUNTER — Ambulatory Visit: Payer: Medicare Other | Attending: Internal Medicine

## 2019-08-14 ENCOUNTER — Ambulatory Visit: Payer: Medicare Other

## 2019-08-14 DIAGNOSIS — Z23 Encounter for immunization: Secondary | ICD-10-CM | POA: Insufficient documentation

## 2019-08-14 NOTE — Progress Notes (Signed)
   Covid-19 Vaccination Clinic  Name:  Ariel Gonzalez    MRN: [id number] DOB: Aug 28, 1940  08/14/2019  [name] was observed post Covid-19 immunization for 30 minutes based on pre-vaccination screening without incidence. She was provided with Vaccine Information Sheet and instruction to access the V-Safe system.   [name] was instructed to call 911 with any severe reactions post vaccine: Marland Kitchen Difficulty breathing  . Swelling of your face and throat  . A fast heartbeat  . A bad rash all over your body  . Dizziness and weakness    Immunizations Administered    Name Date Dose VIS Date Route   Pfizer COVID-19 Vaccine 08/14/2019 11:33 AM 0.3 mL 06/23/2019 Intramuscular   Manufacturer: [place]   Lot: [id number]   [place]: [id number]

## 2019-08-22 ENCOUNTER — Telehealth: Payer: Self-pay | Admitting: Emergency Medicine

## 2019-08-22 NOTE — Telephone Encounter (Signed)
Spoke with pt, she states the pharmacy only gave her 2 weeks worth of her medication, Pulmicort. I advised her that we sent in 125ml which is enough for a month. I advised her to call us when it is time to refill so we call the pharmacy for her.to make sure they give her the full prescription. Nothing further is needed.

## 2019-08-24 ENCOUNTER — Ambulatory Visit (INDEPENDENT_AMBULATORY_CARE_PROVIDER_SITE_OTHER): Payer: Medicare Other | Admitting: Emergency Medicine

## 2019-08-24 ENCOUNTER — Other Ambulatory Visit: Payer: Self-pay

## 2019-08-24 ENCOUNTER — Encounter: Payer: Self-pay | Admitting: Emergency Medicine

## 2019-08-24 DIAGNOSIS — J449 Chronic obstructive pulmonary disease, unspecified: Secondary | ICD-10-CM | POA: Diagnosis not present

## 2019-08-24 NOTE — Progress Notes (Signed)
Virtual Visit via Telephone Note  I connected with Ariel Gonzalez on 08/24/19 at  4:15 PM EST by telephone and verified that I am speaking with the correct person using two identifiers.  Location: Patient: Home Provider: Office   I discussed the limitations, risks, security and privacy concerns of performing an evaluation and management service by telephone and the availability of in person appointments. I also discussed with the patient that there may be a patient responsible charge related to this service. The patient expressed understanding and agreed to proceed.   History of Present Illness: Follow-up visit for [name].  She is 61 with very severe COPD and associated hypoxemic respiratory failure.  We have been adjusting her medications over the last several visits as she is approaching end-stage.  I now have her on chronic prednisone 10 mg daily.  She is on [place], Pulmicort, Yupelri.  We now have coverage for the [facility] which was profoundly expensive.   Observations/Objective: She is feeling better - less breathless. She has gained about 30 lbs since we started the prednisone, doesn't seem to be edema or swelling. She is using albuterol about 2x a week.   Assessment and Plan: Severe COPD w hypoxemia. She is better on the pred 10mg , but has had significant wt gain. We can consider changing the pred dose going forward if the wt gain continues, impacts other aspects of her health. The BD regimen is working well and she now has coverage for the [place]. Plan to continue this + Brovana / Pulmicort. At some point going forward we will need to discuss palliative care involvement, but I believe we can defer for now. We have established DNR status.   Hypoxemic respiratory failure > continue same O2.   Follow Up Instructions: 2 months or prn   I discussed the assessment and treatment plan with the patient. The patient was provided an opportunity to ask questions and all were answered. The  patient agreed with the plan and demonstrated an understanding of the instructions.   The patient was advised to call back or seek an in-person evaluation if the symptoms worsen or if the condition fails to improve as anticipated.  I provided 15 minutes of non-face-to-face time during this encounter.   Collene Gobble, MD ;l

## 2019-08-25 DIAGNOSIS — E7800 Pure hypercholesterolemia, unspecified: Secondary | ICD-10-CM | POA: Diagnosis not present

## 2019-08-25 DIAGNOSIS — I10 Essential (primary) hypertension: Secondary | ICD-10-CM | POA: Diagnosis not present

## 2019-08-25 DIAGNOSIS — M8580 Other specified disorders of bone density and structure, unspecified site: Secondary | ICD-10-CM | POA: Diagnosis not present

## 2019-08-25 DIAGNOSIS — I2510 Atherosclerotic heart disease of native coronary artery without angina pectoris: Secondary | ICD-10-CM | POA: Diagnosis not present

## 2019-08-25 DIAGNOSIS — F331 Major depressive disorder, recurrent, moderate: Secondary | ICD-10-CM | POA: Diagnosis not present

## 2019-08-25 DIAGNOSIS — E785 Hyperlipidemia, unspecified: Secondary | ICD-10-CM | POA: Diagnosis not present

## 2019-08-25 DIAGNOSIS — J449 Chronic obstructive pulmonary disease, unspecified: Secondary | ICD-10-CM | POA: Diagnosis not present

## 2019-08-25 DIAGNOSIS — I4891 Unspecified atrial fibrillation: Secondary | ICD-10-CM | POA: Diagnosis not present

## 2019-09-20 DIAGNOSIS — H04129 Dry eye syndrome of unspecified lacrimal gland: Secondary | ICD-10-CM | POA: Diagnosis not present

## 2019-09-20 DIAGNOSIS — I10 Essential (primary) hypertension: Secondary | ICD-10-CM | POA: Diagnosis not present

## 2019-09-20 DIAGNOSIS — H35033 Hypertensive retinopathy, bilateral: Secondary | ICD-10-CM | POA: Diagnosis not present

## 2019-10-02 DIAGNOSIS — I10 Essential (primary) hypertension: Secondary | ICD-10-CM | POA: Diagnosis not present

## 2019-10-02 DIAGNOSIS — E7800 Pure hypercholesterolemia, unspecified: Secondary | ICD-10-CM | POA: Diagnosis not present

## 2019-10-02 DIAGNOSIS — I4891 Unspecified atrial fibrillation: Secondary | ICD-10-CM | POA: Diagnosis not present

## 2019-10-02 DIAGNOSIS — E785 Hyperlipidemia, unspecified: Secondary | ICD-10-CM | POA: Diagnosis not present

## 2019-10-02 DIAGNOSIS — J449 Chronic obstructive pulmonary disease, unspecified: Secondary | ICD-10-CM | POA: Diagnosis not present

## 2019-10-02 DIAGNOSIS — I2510 Atherosclerotic heart disease of native coronary artery without angina pectoris: Secondary | ICD-10-CM | POA: Diagnosis not present

## 2019-10-02 DIAGNOSIS — F331 Major depressive disorder, recurrent, moderate: Secondary | ICD-10-CM | POA: Diagnosis not present

## 2019-10-02 DIAGNOSIS — M8580 Other specified disorders of bone density and structure, unspecified site: Secondary | ICD-10-CM | POA: Diagnosis not present

## 2019-11-07 ENCOUNTER — Other Ambulatory Visit: Payer: Self-pay | Admitting: Emergency Medicine

## 2019-11-07 DIAGNOSIS — I4891 Unspecified atrial fibrillation: Secondary | ICD-10-CM | POA: Diagnosis not present

## 2019-11-07 DIAGNOSIS — J449 Chronic obstructive pulmonary disease, unspecified: Secondary | ICD-10-CM | POA: Diagnosis not present

## 2019-11-07 DIAGNOSIS — I2510 Atherosclerotic heart disease of native coronary artery without angina pectoris: Secondary | ICD-10-CM | POA: Diagnosis not present

## 2019-11-07 DIAGNOSIS — I10 Essential (primary) hypertension: Secondary | ICD-10-CM | POA: Diagnosis not present

## 2019-11-07 DIAGNOSIS — E785 Hyperlipidemia, unspecified: Secondary | ICD-10-CM | POA: Diagnosis not present

## 2019-11-07 DIAGNOSIS — E7800 Pure hypercholesterolemia, unspecified: Secondary | ICD-10-CM | POA: Diagnosis not present

## 2019-11-07 DIAGNOSIS — M8580 Other specified disorders of bone density and structure, unspecified site: Secondary | ICD-10-CM | POA: Diagnosis not present

## 2019-11-07 DIAGNOSIS — F331 Major depressive disorder, recurrent, moderate: Secondary | ICD-10-CM | POA: Diagnosis not present

## 2019-12-06 ENCOUNTER — Other Ambulatory Visit: Payer: Self-pay | Admitting: *Deleted

## 2019-12-06 DIAGNOSIS — J449 Chronic obstructive pulmonary disease, unspecified: Secondary | ICD-10-CM

## 2019-12-06 MED ORDER — BUDESONIDE 0.5 MG/2ML IN SUSP: 0.5000 mg | Freq: Two times a day (BID) | RESPIRATORY_TRACT | 5 refills | Status: DC

## 2019-12-20 ENCOUNTER — Encounter: Payer: Self-pay | Admitting: Cardiovascular Disease

## 2019-12-20 ENCOUNTER — Ambulatory Visit (INDEPENDENT_AMBULATORY_CARE_PROVIDER_SITE_OTHER): Payer: Medicare Other | Admitting: Cardiovascular Disease

## 2019-12-20 ENCOUNTER — Other Ambulatory Visit: Payer: Self-pay

## 2019-12-20 VITALS — BP 104/56 | HR 94 | Ht 66.0 in | Wt 163.0 lb

## 2019-12-20 DIAGNOSIS — I480 Paroxysmal atrial fibrillation: Secondary | ICD-10-CM | POA: Diagnosis not present

## 2019-12-20 DIAGNOSIS — J449 Chronic obstructive pulmonary disease, unspecified: Secondary | ICD-10-CM

## 2019-12-20 MED ORDER — POTASSIUM CHLORIDE ER 10 MEQ PO TBCR: 10.0000 meq | EXTENDED_RELEASE_TABLET | Freq: Every day | ORAL | 3 refills | Status: AC

## 2019-12-20 MED ORDER — FUROSEMIDE 20 MG PO TABS: 20.0000 mg | ORAL_TABLET | Freq: Every day | ORAL | 3 refills | Status: AC

## 2019-12-20 MED ORDER — RIVAROXABAN 20 MG PO TABS: 20.0000 mg | ORAL_TABLET | Freq: Every day | ORAL | 3 refills | Status: AC

## 2019-12-20 NOTE — Patient Instructions (Signed)
Medication Instructions:  1) DISCONTINUE Eliquis 2) START Xarelto 20mg  once daily  3) DECREASE Furosemide to 20mg  once daily 4) DECREASE Potassium to 10 meq once daily  *If you need a refill on your cardiac medications before your next appointment, please call your pharmacy*   Lab Work: BMET and CBC in 1 month  If you have labs (blood work) drawn today and your tests are completely normal, you will receive your results only by: Marland Kitchen MyChart Message (if you have MyChart) OR . A paper copy in the mail If you have any lab test that is abnormal or we need to change your treatment, we will call you to review the results.   Testing/Procedures: None   Follow-Up: At [facility], you and your health needs are our priority.  As part of our continuing mission to provide you with exceptional heart care, we have created designated Provider Care Teams.  These Care Teams include your primary Cardiologist (physician) and Advanced Practice Providers (APPs -  Physician Assistants and Nurse Practitioners) who all work together to provide you with the care you need, when you need it.  We recommend signing up for the patient portal called "MyChart".  Sign up information is provided on this After Visit Summary.  MyChart is used to connect with patients for Virtual Visits (Telemedicine).  Patients are able to view lab/test results, encounter notes, upcoming appointments, etc.  Non-urgent messages can be sent to your provider as well.   To learn more about what you can do with MyChart, go to [email or web address].    Your next appointment:   3-4 month(s)  The format for your next appointment:   In Person  Provider:   You may see Mertie Moores, MD or one of the following Advanced Practice Providers on your designated Care Team:    Richardson Dopp, PA-C  Robbie Lis, [place]    Other Instructions

## 2019-12-20 NOTE — Progress Notes (Signed)
Cardiology Office Note   Date:  12/20/2019   ID:  Ariel Gonzalez, DOB 03-30-1941, MRN [id number]  PCP:  Gaynelle Arabian, MD  Cardiologist:    Mertie Moores, MD   Chief Complaint  Patient presents with  . Atrial Fibrillation   Problem List 1. COPD 2. Coronary Artery Calcification 3.  Paroxysmal Atrial fib   Previous notes.  Ariel Gonzalez is a 79 y.o. female who presents for coronary artery calcifications . Mother of Ariel Gonzalez .  Records from Dr. Marisue Humble were reviewed.  She has a history of smoking. A CT scan was recently performed to evaluate her for the possibility of lung cancer. She was found have coronary artery calcifications.  Has had a chronic cough.  Does not exercise  - has COPD  Uses home O2 at times - especially after being in the shower.   Stays active.  No syncope.  Lives at the [facility] in [place], laundry, shopping - never has CP .  stopped smoking - 2 years   12/18/2015: Ariel Gonzalez  presents today for follow-up of her coronary calcifications. She's had a stress Myoview study that was low risk and she has no ischemia. She has normal left trigger systolic function with an ejection fraction of 66%.  No CP , chronic COPD - unable to walk  April 14, 2019:  And is seen today after 3-year absence.   Ariel Gonzalez's mother )   We were asked to see her by Dr. Marisue Humble for further eval and management of her paroxysmal atrial fib.   She has a history of COPD.  She was at pulmonary rehab and the rehab nurse noted an elevated heart rate.  She was asymptomatic.  She was sent to her PCP and she was confirmed to have atrial fibrillation.  Her heart rate was 140.  She converted to sinus rhythm with a Cardizem bolus.  She was started on Eliquis 5 mg twice a day and metoprolol 25 mg twice a day with follow-up in the A. fib clinic.  She was seen by [name] on September 14.  She was in sinus rhythm at that time.  Sees [name] for pulmonary .  Developed a  cough this past month .  Was wheezing   Started using a nebulizer which has helped   Echo from Sept. 20, 2020 .  Shows normal LV systolic function Mild diastolic dysfunction  June 15, 2019:  Ariel Gonzalez is seen today for follow-up visit.  She is had lots of leg edema that has improved with increasing Lasix doses and using the lounge doctor leg rest.  He continues to have severe shortness of breath related to her COPD.  Saw R.  Byrum 2 days ago (virtual visit )  Added prednisone 10 mg a day   Leg edema has resolved.  Back to Lasix 40 mg a day , Kdur 10 meq a day  Is using the Lounge Doctor regularly . Is still wheezing quite a bit   December 20, 2019:  Dwanna is seen today for follow up of her PAF, HLD ,  and leg edema She has severe COPD She has had some anxeity and some COPD exacerbation  Took an urgent albuterol treatment  Has been on chronic prednisone Has gained 35 lbs since I last saw here In her take her Xarelto at [place] 12 hours after this 1 time transition to just a one-time double blood thinner on at the same time so just for  that 1day 12 hours apart and the last has elected just to get 11 have not been admitted with 6 extremity to think it is early she can images plan on try to get that started Xarelto roughly 12 hours after that first and then then you just been from but so the next that it was at 637 its been 23 however states he will be fine we will get this with the largest meal of the most most people at supper and MRI: Started with what you in the evening is with him he only ate a meal and after Xarelto  Past Medical History:  Diagnosis Date  . Allergic rhinitis   . Arthritis    CERVICAL AND LUMBER DDD  . COPD (chronic obstructive pulmonary disease) ([place])    HFA 75% p coaching July 17, 2010  . HTN (hypertension)   . Migraine   . Osteopenia   . Overactive bladder   . Panic attacks   . Squamous cell carcinoma of skin     Past Surgical History:  Procedure Laterality  Date  . No Name  2010  . CATARACTS Bilateral 2009  . CHOLECYSTECTOMY    . JAW BONE GRAFT  2009  . TOTAL ABDOMINAL HYSTERECTOMY W/ BILATERAL SALPINGOOPHORECTOMY       Current Outpatient Medications  Medication Sig Dispense Refill  . albuterol (PROVENTIL) (2.5 MG/3ML) 0.083% nebulizer solution Take 2.5 mg by nebulization daily.     Marland Kitchen arformoterol (BROVANA) 15 MCG/2ML NEBU Take 2 mLs (15 mcg total) by nebulization 2 (two) times daily. 120 mL 12  . atorvastatin (LIPITOR) 40 MG tablet TAKE 1 TABLET BY MOUTH ONCE DAILY 30 tablet 0  . budesonide (PULMICORT) 0.5 MG/2ML nebulizer solution Take 2 mLs (0.5 mg total) by nebulization 2 (two) times daily. 120 mL 5  . buPROPion (WELLBUTRIN XL) 150 MG 24 hr tablet Take 1 tablet by mouth daily.    . Cholecalciferol (PA VITAMIN D-3) 2000 UNITS CAPS Take 2,000 Units by mouth daily.     . citalopram (CELEXA) 20 MG tablet Take 2 tablets (40 mg total) by mouth daily. Future refills through PCP. 60 tablet 0  . clonazePAM (KLONOPIN) 1 MG tablet Half tablet to whole tablet twice a day for anxiety    . diltiazem (CARDIZEM CD) 120 MG 24 hr capsule Take 1 capsule (120 mg total) by mouth daily. 90 capsule 3  . furosemide (LASIX) 20 MG tablet Take 1 tablet (20 mg total) by mouth daily. 90 tablet 3  . montelukast (SINGULAIR) 10 MG tablet Take 1 tablet (10 mg total) by mouth at bedtime. 30 tablet 3  . Multiple Vitamin (MULTIVITAMIN) tablet Take 1 tablet by mouth daily.      . OXYGEN Inhale 4 L into the lungs continuous.     . potassium chloride (KLOR-CON) 10 MEQ tablet Take 1 tablet (10 mEq total) by mouth daily. 90 tablet 3  . predniSONE (DELTASONE) 10 MG tablet Take 1 tablet by mouth once daily with breakfast 30 tablet 5  . Revefenacin (YUPELRI) 175 MCG/3ML SOLN Inhale 3 mLs into the lungs daily. 90 mL 11  . valsartan (DIOVAN) 80 MG tablet Take 1 tablet (80 mg total) by mouth daily. 30 tablet 11  . VENTOLIN HFA 108 (90 Base) MCG/ACT inhaler INHALE 2 PUFFS  EVERY 4 HOURS AS NEEDED 54 g 1  . rivaroxaban (XARELTO) 20 MG TABS tablet Take 1 tablet (20 mg total) by mouth daily with supper. 90 tablet 3   No current  facility-administered medications for this visit.    Allergies:   Cefaclor, Clarithromycin, Erythromycin, and Iodine    Social History:  The patient  reports that she quit smoking about 11 years ago. Her smoking use included cigarettes. She has a 50.00 pack-year smoking history. She has never used smokeless tobacco. She reports that she does not drink alcohol or use drugs.   Family History:  The patient's family history includes Breast cancer in her mother and sister; Cancer in her mother and sister; Coronary artery disease in her father; Heart attack in her father.   Father died at age 55 due to MI .    ROS:  Please see the history of present illness.    Physical Exam: Blood pressure (!) 104/56, pulse 94, height 5\' 6"  (1.676 m), weight 163 lb (73.9 kg), SpO2 97 %.  GEN:  Chronically ill female,   Mild respiratory distress.  HEENT: Normal NECK: No JVD; No carotid bruits LYMPHATICS: No lymphadenopathy CARDIAC: RRR   RESPIRATORY:   bilat wheezing  ABDOMEN: Soft, non-tender, non-distended MUSCULOSKELETAL:  No edema; No deformity  SKIN: Warm and dry NEUROLOGIC:  Alert and oriented x 3    EKG December 20, 2019: Normal sinus rhythm at 93.  No ST or T wave changes.  Recent Labs: 04/26/2019: ALT 26; B Natriuretic Peptide 145.9 04/27/2019: TSH 0.311 05/02/2019: Hemoglobin 12.9; Platelets 246 06/15/2019: BUN 17; Creatinine, Ser 0.75; Potassium 4.3; Sodium 145    Lipid Panel    Component Value Date/Time   CHOL 137 12/12/2015 0919   TRIG 75 12/12/2015 0919   HDL 86 12/12/2015 0919   CHOLHDL 1.6 12/12/2015 0919   VLDL 15 12/12/2015 0919   LDLCALC 36 12/12/2015 0919     Labs from [place] office shows an LDL of 135, total of 230   Wt Readings from Last 3 Encounters:  12/20/19 163 lb (73.9 kg)  06/20/19 151 lb (68.5 kg)    06/15/19 151 lb (68.5 kg)      Other studies Reviewed: Additional studies/ records that were reviewed today include: . Review of the above records demonstrates:    ASSESSMENT AND PLAN:  1.  Coronary artery calcifications:  No angina  .    2. Hyperlipidemia:    Stable.  Cont meds.   3.  Atrial fibrillation:   She is maintained normal sinus rhythm.  Her insurance prefers Xarelto instead of Eliquis.  Will transition from Eliquis to [place].  We will have her take her morning dose of Eliquis and then take the evening dose of Xarelto approximately 12 hours after that morning Eliquis.  I reviewed the transition with her daughter and she understands.  We will check a basic metabolic profile and CBC in 1 month to make sure her labs are stable.  4. COPD :     5.  Chronic diastolic congestive heart failure: She is is somewhat volume depleted.  I think that she would do better on a low-dose of Lasix.  We will decrease the Lasix to 20 mg a day and decrease the potassium chloride to 10 mEq a day.  I have reviewed with the patient and her daughter that we still need her to urinate some but hopefully not quite as much as she did on her dose.  I will see her in 3 to 4 months for follow-up visit.  Current medicines are reviewed at length with the patient today.  The patient does not have concerns regarding medicines.  The following changes have been made:  no change  Labs/ tests ordered today include:   Orders Placed This Encounter  Procedures  . Basic metabolic panel  . CBC  . EKG 12-Lead      Mertie Moores, MD  12/20/2019 5:16 PM    [facility] [address], [place], [address]  [address] Phone: [phone]; Fax: [phone]

## 2020-01-02 DIAGNOSIS — J449 Chronic obstructive pulmonary disease, unspecified: Secondary | ICD-10-CM | POA: Diagnosis not present

## 2020-01-02 DIAGNOSIS — E785 Hyperlipidemia, unspecified: Secondary | ICD-10-CM | POA: Diagnosis not present

## 2020-01-02 DIAGNOSIS — E7800 Pure hypercholesterolemia, unspecified: Secondary | ICD-10-CM | POA: Diagnosis not present

## 2020-01-02 DIAGNOSIS — I4891 Unspecified atrial fibrillation: Secondary | ICD-10-CM | POA: Diagnosis not present

## 2020-01-02 DIAGNOSIS — M8580 Other specified disorders of bone density and structure, unspecified site: Secondary | ICD-10-CM | POA: Diagnosis not present

## 2020-01-02 DIAGNOSIS — F331 Major depressive disorder, recurrent, moderate: Secondary | ICD-10-CM | POA: Diagnosis not present

## 2020-01-02 DIAGNOSIS — I10 Essential (primary) hypertension: Secondary | ICD-10-CM | POA: Diagnosis not present

## 2020-01-02 DIAGNOSIS — I2510 Atherosclerotic heart disease of native coronary artery without angina pectoris: Secondary | ICD-10-CM | POA: Diagnosis not present

## 2020-01-18 ENCOUNTER — Other Ambulatory Visit: Payer: Medicare Other

## 2020-01-19 ENCOUNTER — Other Ambulatory Visit: Payer: Medicare Other | Admitting: *Deleted

## 2020-01-19 ENCOUNTER — Other Ambulatory Visit: Payer: Self-pay

## 2020-01-19 DIAGNOSIS — I480 Paroxysmal atrial fibrillation: Secondary | ICD-10-CM

## 2020-01-20 LAB — BASIC METABOLIC PANEL
BUN/Creatinine Ratio: 18 (ref 12–28)
BUN: 16 mg/dL (ref 8–27)
CO2: 31 mmol/L — ABNORMAL HIGH (ref 20–29)
Calcium: 8.9 mg/dL (ref 8.7–10.3)
Chloride: 100 mmol/L (ref 96–106)
Creatinine, Ser: 0.89 mg/dL (ref 0.57–1.00)
GFR calc Af Amer: 72 mL/min/{1.73_m2} (ref >59)
GFR calc non Af Amer: 62 mL/min/{1.73_m2} (ref >59)
Glucose: 157 mg/dL — ABNORMAL HIGH (ref 65–99)
Potassium: 4.7 mmol/L (ref 3.5–5.2)
Sodium: 142 mmol/L (ref 134–144)

## 2020-01-20 LAB — CBC
Hematocrit: 40.9 % (ref 34.0–46.6)
Hemoglobin: 13.4 g/dL (ref 11.1–15.9)
MCH: 31.5 pg (ref 26.6–33.0)
MCHC: 32.8 g/dL (ref 31.5–35.7)
MCV: 96 fL (ref 79–97)
Platelets: 268 10*3/uL (ref 150–450)
RBC: 4.25 x10E6/uL (ref 3.77–5.28)
RDW: 11.8 % (ref 11.7–15.4)
WBC: 8.5 10*3/uL (ref 3.4–10.8)

## 2020-02-03 DIAGNOSIS — I4891 Unspecified atrial fibrillation: Secondary | ICD-10-CM | POA: Diagnosis not present

## 2020-02-03 DIAGNOSIS — J449 Chronic obstructive pulmonary disease, unspecified: Secondary | ICD-10-CM | POA: Diagnosis not present

## 2020-02-03 DIAGNOSIS — E785 Hyperlipidemia, unspecified: Secondary | ICD-10-CM | POA: Diagnosis not present

## 2020-02-03 DIAGNOSIS — M8580 Other specified disorders of bone density and structure, unspecified site: Secondary | ICD-10-CM | POA: Diagnosis not present

## 2020-02-03 DIAGNOSIS — E7800 Pure hypercholesterolemia, unspecified: Secondary | ICD-10-CM | POA: Diagnosis not present

## 2020-02-03 DIAGNOSIS — F331 Major depressive disorder, recurrent, moderate: Secondary | ICD-10-CM | POA: Diagnosis not present

## 2020-02-03 DIAGNOSIS — I10 Essential (primary) hypertension: Secondary | ICD-10-CM | POA: Diagnosis not present

## 2020-02-03 DIAGNOSIS — I2510 Atherosclerotic heart disease of native coronary artery without angina pectoris: Secondary | ICD-10-CM | POA: Diagnosis not present

## 2020-02-05 ENCOUNTER — Telehealth: Payer: Self-pay | Admitting: Emergency Medicine

## 2020-02-05 NOTE — Telephone Encounter (Signed)
I agree that she needs an office visit either with me, Wendi Snipes, possibly as a virtual visit.  I would like to discuss palliative care, hospice care options since this would give her more support in the home.

## 2020-02-05 NOTE — Telephone Encounter (Signed)
Spoke with patient's daughter Ariel Gonzalez and says that they got a call from the Critical care pharmacy and told Ariel Gonzalez to reach out to our office to let us know that she is getting a little worse. Patient uses albuterol nebs very first and was only doing them once a week but is now twice a day. But does better with nebs than inhalers. She is also on Yupelri and takes [place] and Pulmicort together. They feel like she is declining and not sure what else to do for her. Patient falling, bumping into things Brusing badly. Daughter is also going to reach out to Cardiologist due to her recent blood thinner being changed. Patient was on Eliquis and is now on Xarelto.   Patient has seen Aaron Edelman and [place] before if she needs to come in and be seen. Last seen on 08/24/19 which was a TELEVISIT and was told to follow up in 2 months.   Dr. Lamonte Sakai please advise

## 2020-02-05 NOTE — Telephone Encounter (Signed)
Spoke with the pt's daughter and scheduled appt with TP for Thurs at 1:30 in office per her request.

## 2020-02-05 NOTE — Telephone Encounter (Signed)
LMTCB x 1 

## 2020-02-08 ENCOUNTER — Ambulatory Visit: Payer: Medicare Other | Admitting: Adult Health

## 2020-02-14 ENCOUNTER — Telehealth: Payer: Self-pay | Admitting: Pulmonary Disease

## 2020-02-14 NOTE — Telephone Encounter (Signed)
Patient's daughter called to ask if appointment could be a Televisit. Per Dr. Agustina Caroli note patient needs to be seen in the office. Daughter verbalized understanding of plan.

## 2020-02-16 ENCOUNTER — Ambulatory Visit: Payer: Medicare Other | Admitting: Pulmonary Disease

## 2020-02-29 ENCOUNTER — Ambulatory Visit (INDEPENDENT_AMBULATORY_CARE_PROVIDER_SITE_OTHER): Payer: Medicare Other | Admitting: Adult Health

## 2020-02-29 ENCOUNTER — Encounter: Payer: Self-pay | Admitting: Adult Health

## 2020-02-29 ENCOUNTER — Ambulatory Visit (INDEPENDENT_AMBULATORY_CARE_PROVIDER_SITE_OTHER): Payer: Medicare Other

## 2020-02-29 ENCOUNTER — Other Ambulatory Visit: Payer: Self-pay

## 2020-02-29 VITALS — BP 120/56 | HR 94 | Temp 98.5°F | Ht 66.5 in | Wt 163.2 lb

## 2020-02-29 DIAGNOSIS — J439 Emphysema, unspecified: Secondary | ICD-10-CM | POA: Diagnosis not present

## 2020-02-29 DIAGNOSIS — J9611 Chronic respiratory failure with hypoxia: Secondary | ICD-10-CM

## 2020-02-29 DIAGNOSIS — J449 Chronic obstructive pulmonary disease, unspecified: Secondary | ICD-10-CM

## 2020-02-29 DIAGNOSIS — R0602 Shortness of breath: Secondary | ICD-10-CM | POA: Diagnosis not present

## 2020-02-29 MED ORDER — PREDNISONE 20 MG PO TABS: 20.0000 mg | ORAL_TABLET | Freq: Every day | ORAL | 0 refills | Status: DC

## 2020-02-29 NOTE — Assessment & Plan Note (Signed)
Continue on O2 

## 2020-02-29 NOTE — Progress Notes (Signed)
@Patient  ID: Ariel Gonzalez, female    DOB: 17-Jul-1940, 79 y.o.   MRN: [id number]  Chief Complaint  Patient presents with   Follow-up    COPD     Referring provider: Gaynelle Arabian, MD  HPI: 79 year old female former smoker followed for very severe COPD with associated hypoxic respiratory failure on oxygen.  She is on chronic steroids with prednisone 10 mg daily. Medical history significant for A. fib on chronic anticoagulation with Xarelto. TEST/EVENTS :  Low-dose CT screening August 11, 2018 lung RADS 2 benign appearance.,  Emphysema, stable bilateral pulmonary nodules.  Spirometry April 2018 FEV1 31%, 38, FVC 62%.  02/29/2020 Follow up ; COPD , O2 RF  Patient presents for a 77-month follow-up.  Patient has underlying very severe COPD Maintained on budesonide and Brovana nebulizer twice daily.  She is on chronic steroids 10 mg daily.  And is on Yupelri daily. Patient is oxygen dependent at 4.5l/m . Oxygen demands have increased over couple of years.  Since last visit patient patient says her breathing seems to be getting slowly worse , gets winded with minimal activity. More wheezing last few weeks on/off.  Cough is better overall no more coughing fits.     Allergies  Allergen Reactions   Cefaclor     rash   Clarithromycin     rash   Erythromycin     rash   Iodine     rash    Immunization History  Administered Date(s) Administered   Fluad Quad(high Dose 65+) 03/08/2019   Influenza Split 05/13/2012   Influenza Whole 05/25/2011   Influenza, High Dose Seasonal PF 07/13/2016, 04/07/2018   Influenza,inj,Quad PF,6+ Mos 04/18/2013, 05/04/2014, 06/13/2015   Influenza-Unspecified 07/13/2016   PFIZER SARS-COV-2 Vaccination 07/27/2019, 08/14/2019   Pneumococcal Conjugate-13 06/18/2014   Pneumococcal Polysaccharide-23 11/23/2017    Past Medical History:  Diagnosis Date   Allergic rhinitis    Arthritis    CERVICAL AND LUMBER DDD   COPD (chronic  obstructive pulmonary disease) (HCC)    HFA 75% p coaching July 17, 2010   HTN (hypertension)    Migraine    Osteopenia    Overactive bladder    Panic attacks    Squamous cell carcinoma of skin     Tobacco History: Social History   Tobacco Use  Smoking Status Former Smoker   Packs/day: 1.00   Years: 50.00   Pack years: 50.00   Types: Cigarettes   Quit date: 04/01/2008   Years since quitting: 11.9  Smokeless Tobacco Never Used  Tobacco Comment   Counseled to remain smoke free   Counseling given: Not Answered Comment: Counseled to remain smoke free   Outpatient Medications Prior to Visit  Medication Sig Dispense Refill   albuterol (PROVENTIL) (2.5 MG/3ML) 0.083% nebulizer solution Take 2.5 mg by nebulization daily.      arformoterol (BROVANA) 15 MCG/2ML NEBU Take 2 mLs (15 mcg total) by nebulization 2 (two) times daily. 120 mL 12   atorvastatin (LIPITOR) 40 MG tablet TAKE 1 TABLET BY MOUTH ONCE DAILY 30 tablet 0   budesonide (PULMICORT) 0.5 MG/2ML nebulizer solution Take 2 mLs (0.5 mg total) by nebulization 2 (two) times daily. 120 mL 5   buPROPion (WELLBUTRIN XL) 150 MG 24 hr tablet Take 1 tablet by mouth daily.     Cholecalciferol (PA VITAMIN D-3) 2000 UNITS CAPS Take 2,000 Units by mouth daily.      citalopram (CELEXA) 20 MG tablet Take 2 tablets (40 mg total) by mouth daily.  Future refills through PCP. 60 tablet 0   clonazePAM (KLONOPIN) 1 MG tablet Half tablet to whole tablet twice a day for anxiety  1 tab q am for anxiety as of 02/29/2020     diltiazem (CARDIZEM CD) 120 MG 24 hr capsule Take 1 capsule (120 mg total) by mouth daily. 90 capsule 3   furosemide (LASIX) 20 MG tablet Take 1 tablet (20 mg total) by mouth daily. 90 tablet 3   Melatonin 5 MG CAPS Take 2 each by mouth. 2 tabs each night     montelukast (SINGULAIR) 10 MG tablet Take 1 tablet (10 mg total) by mouth at bedtime. 30 tablet 3   Multiple Vitamin (MULTIVITAMIN) tablet Take 1  tablet by mouth daily.       OXYGEN Inhale 4 L into the lungs continuous.      potassium chloride (KLOR-CON) 10 MEQ tablet Take 1 tablet (10 mEq total) by mouth daily. 90 tablet 3   predniSONE (DELTASONE) 10 MG tablet Take 1 tablet by mouth once daily with breakfast 30 tablet 5   Revefenacin (YUPELRI) 175 MCG/3ML SOLN Inhale 3 mLs into the lungs daily. 90 mL 11   rivaroxaban (XARELTO) 20 MG TABS tablet Take 1 tablet (20 mg total) by mouth daily with supper. 90 tablet 3   valsartan (DIOVAN) 80 MG tablet Take 1 tablet (80 mg total) by mouth daily. 30 tablet 11   VENTOLIN HFA 108 (90 Base) MCG/ACT inhaler INHALE 2 PUFFS EVERY 4 HOURS AS NEEDED 54 g 1   No facility-administered medications prior to visit.     Review of Systems:   Constitutional:   No  weight loss, night sweats,  Fevers, chills,  +fatigue, or  lassitude.  HEENT:   No headaches,  Difficulty swallowing,  Tooth/dental problems, or  Sore throat,                No sneezing, itching, ear ache, nasal congestion, post nasal drip,   CV:  No chest pain,  Orthopnea, PND, swelling in lower extremities, anasarca, dizziness, palpitations, syncope.   GI  No heartburn, indigestion, abdominal pain, nausea, vomiting, diarrhea, change in bowel habits, loss of appetite, bloody stools.   Resp:   No chest wall deformity  Skin: no rash or lesions.  GU: no dysuria, change in color of urine, no urgency or frequency.  No flank pain, no hematuria   MS:  No joint pain or swelling.  No decreased range of motion.  No back pain.    Physical Exam  BP (!) 120/56    Pulse 94    Temp 98.5 F (36.9 C) (Temporal)    Ht 5' 6.5" (1.689 m)    Wt 163 lb 3.2 oz (74 kg)    BMI 25.95 kg/m   GEN: A/Ox3; pleasant , NAD, elderly on O2    HEENT:  [place]/AT,   NOSE-clear, THROAT-clear, no lesions, no postnasal drip or exudate noted.   NECK:  Supple w/ fair ROM; no JVD; normal carotid impulses w/o bruits; no thyromegaly or nodules palpated; no  lymphadenopathy.    RESP  Scattered rhonchi , exp wheezing  no accessory muscle use, no dullness to percussion  CARD:  RRR, no m/r/g, tr  peripheral edema, pulses intact, no cyanosis or clubbing.  GI:   Soft & nt; nml bowel sounds; no organomegaly or masses detected.   Musco: Warm bil, no deformities or joint swelling noted.   Neuro: alert, no focal deficits noted.    Skin: Warm,  no lesions or rashes    Lab Results:  CBC     ProBNP No results found for: PROBNP  Imaging: DG Chest 2 View  Result Date: 02/29/2020 CLINICAL DATA:  Shortness of breath EXAM: CHEST - 2 VIEW COMPARISON:  04/26/2019, CT 08/11/2018 FINDINGS: Hyperinflation with emphysematous disease. Mild chronic bronchitic changes. No acute airspace consolidation, pleural effusion or pneumothorax. Stable cardiomediastinal silhouette with aortic atherosclerosis. IMPRESSION: No active cardiopulmonary disease. Emphysematous disease. Electronically Signed   By: Donavan Foil M.D.   On: 02/29/2020 16:13      No flowsheet data found.  No results found for: NITRICOXIDE      Assessment & Plan:   COPD (chronic obstructive pulmonary disease) ([facility]) Mild flare -use short course of steroid burst Check cxr  Plan  Patient Instructions  Continue on Pulmicort and Brovana nebulizer twice daily Continue on Yupelri nebulizer daily  Increase Prednisone 20mg  daily for 1 week.  Continue on Oxygen 4.5l/m  Activity as tolerated Continue on Flutter valve Twice daily .  Abluterol inhaler or neb As needed   Chest xray today .  Follow-up in 3 months with Dr. Lamonte Sakai and as needed Please contact office for sooner follow up if symptoms do not improve or worsen or seek emergency care        Chronic respiratory failure with hypoxia ([place]) Continue on O2.      Rexene Edison, NP 02/29/2020

## 2020-02-29 NOTE — Assessment & Plan Note (Signed)
Mild flare -use short course of steroid burst Check cxr  Plan  Patient Instructions  Continue on Pulmicort and Brovana nebulizer twice daily Continue on Yupelri nebulizer daily  Increase Prednisone 20mg  daily for 1 week.  Continue on Oxygen 4.5l/m  Activity as tolerated Continue on Flutter valve Twice daily .  Abluterol inhaler or neb As needed   Chest xray today .  Follow-up in 3 months with Dr. Lamonte Sakai and as needed Please contact office for sooner follow up if symptoms do not improve or worsen or seek emergency care

## 2020-02-29 NOTE — Patient Instructions (Addendum)
Continue on Pulmicort and Brovana nebulizer twice daily Continue on Yupelri nebulizer daily  Increase Prednisone 20mg  daily for 1 week.  Continue on Oxygen 4.5l/m  Activity as tolerated Continue on Flutter valve Twice daily .  Abluterol inhaler or neb As needed   Chest xray today .  Follow-up in 3 months with Dr. Lamonte Sakai and as needed Please contact office for sooner follow up if symptoms do not improve or worsen or seek emergency care

## 2020-03-01 NOTE — Progress Notes (Signed)
Spoke with patient and provided results to patient per Rexene Edison NP.  She verbalized understanding.  Nothing further needed.

## 2020-03-07 DIAGNOSIS — I10 Essential (primary) hypertension: Secondary | ICD-10-CM | POA: Diagnosis not present

## 2020-03-07 DIAGNOSIS — J449 Chronic obstructive pulmonary disease, unspecified: Secondary | ICD-10-CM | POA: Diagnosis not present

## 2020-03-07 DIAGNOSIS — E785 Hyperlipidemia, unspecified: Secondary | ICD-10-CM | POA: Diagnosis not present

## 2020-03-07 DIAGNOSIS — F331 Major depressive disorder, recurrent, moderate: Secondary | ICD-10-CM | POA: Diagnosis not present

## 2020-03-07 DIAGNOSIS — I4891 Unspecified atrial fibrillation: Secondary | ICD-10-CM | POA: Diagnosis not present

## 2020-03-07 DIAGNOSIS — E7800 Pure hypercholesterolemia, unspecified: Secondary | ICD-10-CM | POA: Diagnosis not present

## 2020-03-07 DIAGNOSIS — I2510 Atherosclerotic heart disease of native coronary artery without angina pectoris: Secondary | ICD-10-CM | POA: Diagnosis not present

## 2020-03-07 DIAGNOSIS — M8580 Other specified disorders of bone density and structure, unspecified site: Secondary | ICD-10-CM | POA: Diagnosis not present

## 2020-03-27 ENCOUNTER — Telehealth: Payer: Self-pay | Admitting: Emergency Medicine

## 2020-03-27 ENCOUNTER — Other Ambulatory Visit: Payer: Self-pay | Admitting: Emergency Medicine

## 2020-03-27 DIAGNOSIS — J441 Chronic obstructive pulmonary disease with (acute) exacerbation: Secondary | ICD-10-CM

## 2020-03-27 NOTE — Telephone Encounter (Signed)
Called and spoke with patient's daughter, [name], listed on [place].  She is requesting a nebulizer machine.  Advised I would contact Dr. Lamonte Sakai and request an order for a nebulizer machine and if received I would send it to [facility].  Dr. Lamonte Sakai, can we order the patient a new nebulizer machine?  Please advise.

## 2020-03-27 NOTE — Telephone Encounter (Signed)
I'm Ok with ordering a new nebulizer machine

## 2020-03-27 NOTE — Telephone Encounter (Signed)
Spoke with sandy regarding order for patient's nebulizer machine sent into [facility] ok per Dr.Byrum . Sandy's voice is understanding. Nothing else further needed.

## 2020-03-29 ENCOUNTER — Other Ambulatory Visit: Payer: Self-pay

## 2020-03-29 ENCOUNTER — Telehealth: Payer: Self-pay | Admitting: Emergency Medicine

## 2020-03-29 DIAGNOSIS — J449 Chronic obstructive pulmonary disease, unspecified: Secondary | ICD-10-CM

## 2020-03-29 DIAGNOSIS — J441 Chronic obstructive pulmonary disease with (acute) exacerbation: Secondary | ICD-10-CM

## 2020-03-29 MED ORDER — ALBUTEROL SULFATE (2.5 MG/3ML) 0.083% IN NEBU
INHALATION_SOLUTION | RESPIRATORY_TRACT | 3 refills | Status: AC
Start: 2020-03-29 — End: ?

## 2020-03-29 NOTE — Telephone Encounter (Signed)
Spoke with patient's pharmacy regarding  needs diagnosis code to fill albuterol nebulizer solution.Advised pharmacy I will send in a new script for patient. Nothing else further needed.

## 2020-04-11 DIAGNOSIS — I2510 Atherosclerotic heart disease of native coronary artery without angina pectoris: Secondary | ICD-10-CM | POA: Diagnosis not present

## 2020-04-11 DIAGNOSIS — E7800 Pure hypercholesterolemia, unspecified: Secondary | ICD-10-CM | POA: Diagnosis not present

## 2020-04-11 DIAGNOSIS — F331 Major depressive disorder, recurrent, moderate: Secondary | ICD-10-CM | POA: Diagnosis not present

## 2020-04-11 DIAGNOSIS — M8580 Other specified disorders of bone density and structure, unspecified site: Secondary | ICD-10-CM | POA: Diagnosis not present

## 2020-04-11 DIAGNOSIS — I10 Essential (primary) hypertension: Secondary | ICD-10-CM | POA: Diagnosis not present

## 2020-04-11 DIAGNOSIS — J449 Chronic obstructive pulmonary disease, unspecified: Secondary | ICD-10-CM | POA: Diagnosis not present

## 2020-04-11 DIAGNOSIS — I4891 Unspecified atrial fibrillation: Secondary | ICD-10-CM | POA: Diagnosis not present

## 2020-04-11 DIAGNOSIS — E785 Hyperlipidemia, unspecified: Secondary | ICD-10-CM | POA: Diagnosis not present

## 2020-04-13 ENCOUNTER — Other Ambulatory Visit: Payer: Self-pay | Admitting: Cardiovascular Disease

## 2020-04-15 ENCOUNTER — Ambulatory Visit: Payer: Medicare Other | Admitting: Cardiovascular Disease

## 2020-04-17 ENCOUNTER — Telehealth: Payer: Self-pay | Admitting: Emergency Medicine

## 2020-04-17 DIAGNOSIS — J449 Chronic obstructive pulmonary disease, unspecified: Secondary | ICD-10-CM

## 2020-04-17 NOTE — Telephone Encounter (Signed)
Spoke with pt's daughter Ariel Gonzalez who stated pt is sleeping a lot more and having more frequent coughing episodes. Pt would like Dr. Lamonte Sakai to consider placing an order for Pallitive care. Family would like to use [facility].  Dr. Lamonte Sakai please Vicente Males. Thank you.

## 2020-04-18 NOTE — Telephone Encounter (Signed)
Yes I am ok with this referral. Thanks.

## 2020-04-18 NOTE — Telephone Encounter (Signed)
Referral has been made. Pt's daughter is aware. Nothing further was needed.

## 2020-04-22 ENCOUNTER — Telehealth: Payer: Self-pay

## 2020-04-22 NOTE — Telephone Encounter (Signed)
(  2:50p) SW attempted to contact patient's daughter [name] to schedule initial palliative care visit. SW received her voicemail. A message was left, requesting a call back.

## 2020-04-23 ENCOUNTER — Other Ambulatory Visit: Payer: Self-pay

## 2020-04-23 MED ORDER — VALSARTAN 80 MG PO TABS
80.0000 mg | ORAL_TABLET | Freq: Every day | ORAL | 8 refills | Status: DC
Start: 2020-04-23 — End: 2020-08-12

## 2020-04-29 ENCOUNTER — Other Ambulatory Visit: Payer: Medicare Other | Admitting: *Deleted

## 2020-04-29 ENCOUNTER — Other Ambulatory Visit: Payer: Self-pay

## 2020-04-29 ENCOUNTER — Non-Acute Institutional Stay: Payer: Medicare Other

## 2020-04-29 VITALS — BP 123/73 | HR 84 | Temp 97.9°F | Resp 20

## 2020-04-29 DIAGNOSIS — Z515 Encounter for palliative care: Secondary | ICD-10-CM

## 2020-04-29 NOTE — Progress Notes (Signed)
COMMUNITY PALLIATIVE CARE SW NOTE  PATIENT NAME: Ariel Gonzalez DOB: January 15, 1941 MRN: [id number]  PRIMARY CARE PROVIDER: Gaynelle Arabian, MD  RESPONSIBLE PARTY:  Acct ID - Guarantor Home Phone Work Phone Relationship Acct Type  [id number] Auburn [phone]  Self P/F     [place] [employer] [place], [place], [place] [address]     PLAN OF CARE and INTERVENTIONS:             1. GOALS OF CARE/ ADVANCE CARE PLANNING: Goal is for patient to remain in her home, as independent as possible. Patent is a DNR, form in the home.  2. SOCIAL/EMOTIONAL/SPIRITUAL ASSESSMENT/ INTERVENTIONS:  SW and RN- Daryl Eastern completed initial visit with patient at her home. Her daughter-Sandy was present with her. The team provided education to the patient and her daughter about the palliative care program, the team's role in patient's care and visit frequency. The patient provided verbal consent for ongoing palliative care services. Patient and her daughter provided medical status and social history. Patient lives alone with daily contact from friends, family and neighbors. She ambulates independently in her home and occasionally uses her walker. Patient is on continuous 02 (4 1/2 liters). She has increased fatigue and weakness and decreased energy. She naps during the day. Patient is easily short of breath with activity where she needs a few minutes to recover. She independent of personal care needs, but has anxiety( patient describes as a panic attack) when she get in the shower. She has a private personal care aide that assist her with showers, personal care and housekeeping tasks 1x week (Wednesday, 12-4 pm).  Patient had a fall a few months ago. SW observed that patient was wheezing and coughed throughout the visit. Her appetite is good where she is eating 2-3 meals per day and she has added an Ensure to her daily diet. Patient was born and raised in [place]. She attended business college. She has three children. She is  widowed. Patient worked in Nurse, children's. She is [employer] by [employer]. Her son-Charlie serves as her POA/HCPOA. Patient has a DNR and MOST form (DNR, Limited Hospitalization, ABT, IV, No feeding tube). SW provided supportive presence, active listening, assessment of needs, comfort, safety and coping, education and reassurance of support. SW will provide ongoing supportive visits to patient and family.  3. PATIENT/CAREGIVER EDUCATION/ COPING:  Patient is alert and oriented x3. She was engaged and realistic about her condition and is receptive to support. Patient appears to be coping well. Patient has a supportive network of family, friends and neighbors.  4. PERSONAL EMERGENCY PLAN: 911 can be accessed for emergencies. The family is assessing modifications to patient's home for additional security and family access.  5. COMMUNITY RESOURCES COORDINATION/ HEALTH CARE NAVIGATION: Patient have a private caregiver 1x weeke 6. FINANCIAL/LEGAL CONCERNS/INTERVENTIONS:  None     SOCIAL HX:  Social History   Tobacco Use  . Smoking status: Former Smoker    Packs/day: 1.00    Years: 50.00    Pack years: 50.00    Types: Cigarettes    Quit date: 04/01/2008    Years since quitting: 12.0  . Smokeless tobacco: Never Used  . Tobacco comment: Counseled to remain smoke free  Substance Use Topics  . Alcohol use: No    Alcohol/week: 0.0 standard drinks    CODE STATUS: DNR ADVANCED DIRECTIVES: Yes MOST FORM COMPLETE: Completed, form on file HOSPICE EDUCATION PROVIDED: No  PPS: Patient is alert and oriented x3. She ambulates independently. Patient is  independent for personal care and ADLs, but requires assistance with showers.     Duration of visit and documentation: 60 minutes.  [address] [place],

## 2020-05-01 NOTE — Progress Notes (Signed)
COMMUNITY PALLIATIVE CARE RN NOTE  PATIENT NAME: Ariel Gonzalez DOB: 06-Nov-1940 MRN: [id number]  PRIMARY CARE PROVIDER: Gaynelle Arabian, MD  RESPONSIBLE PARTY: Ariel Gonzalez (daughter) Acct ID - Guarantor Home Phone Work Phone Relationship Acct Type  [id number] Auburn [phone]  Self P/F     [place], [place], [place] [address]   Covid-19 Pre-screening Negative  PLAN OF CARE and INTERVENTION:  1. ADVANCE CARE PLANNING/GOALS OF CARE: Goal is for patient to remain in her home. She has a DNR and a MOST form. 2. PATIENT/CAREGIVER EDUCATION: Explained Palliative care services, symptom management, safe mobility/transfers, dyspnea management 3. DISEASE STATUS: Joint visit made with Palliative care SW, Ariel Gonzalez for initial visit. Met with patient in her home. Daughter Ariel Gonzalez is present. Patient is alert and oriented x 4, but is forgetful at times. Pleasant mood. She denies pain at this time but does experience pain usually in her lower back. She is oxygen dependent. She does become short of breath during conversation and with exertion. She is currently on 4.5 L. She says she has permission to increase up to 6L with exertion. She has two nebulizer machines, one in her room and one in her dining room. In the mornings, she must take her Albuterol treatment in her room before being able to make it into the dining room where her medications are. It takes her about an hour to do all of her neb treatments and take her medications in the morning. She used to only require her Albuterol inhaler 1-2 times per week, but is now requiring 3-4 times per day. She has been using her flutter valve after she takes her medications and this has been helpful. She has a productive cough. Phlegm expectorated is clear/white. Expiratory wheezing noted after ambulation today. This is a chronic issue. She ambulates independently while holding onto furniture. She does have a walker, but feels that it hurts her back  when she uses it. Her last fall was a couple of months ago in her bedroom. She says she had the lights off and thought she could navigate her room without them. She requires assistance with her showers. Taking showers causes patient very high anxiety. She takes Klonopin every morning. She is continent of both bowel and bladder. She has [facility] on Wednesdays from 12p-4p to help with her shower and perform light housework duties.. She has much less energy and takes naps during the day. Her appetite is good. She says that since she has been taking Prednisone, her weight has increased from 131 lbs to 165 lbs. This bothers her because she is unable to fit into her clothes. She eats 2-3 meals/day and will drink an Ensure if she doesn't feel like eating one of those meals. She is agreeable to future Palliative care visits. Will continue to monitor.  HISTORY OF PRESENT ILLNESS:  This is a 79 yo female with a history of COPD, Afib, hyperlipidemia and anxiety. Palliative care team was requested by patient's daughter Ariel Gonzalez for additional support. Will visit patient monthly and PRN.  CODE STATUS: DNR ADVANCED DIRECTIVES: Y MOST FORM: yes PPS: 50%   PHYSICAL EXAM:   VITALS: Today's Vitals   04/29/20 1333  BP: 123/73  Pulse: 84  Resp: 20  Temp: 97.9 F (36.6 C)  TempSrc: Temporal  SpO2: 96%  PainSc: 0-No pain    LUNGS: expiratory wheezes bilaterally CARDIAC: Cor RRR EXTREMITIES: No edema SKIN: thin/frail skin; bruise on left hand  NEURO: Alert and oriented  x 4, forgetful, pleasant mood, generalized weakness, ambulatory   (Duration of visit and documentation 90 minutes)   Ariel Eastern, RN BSN

## 2020-05-09 DIAGNOSIS — I2510 Atherosclerotic heart disease of native coronary artery without angina pectoris: Secondary | ICD-10-CM | POA: Diagnosis not present

## 2020-05-09 DIAGNOSIS — E7800 Pure hypercholesterolemia, unspecified: Secondary | ICD-10-CM | POA: Diagnosis not present

## 2020-05-09 DIAGNOSIS — F331 Major depressive disorder, recurrent, moderate: Secondary | ICD-10-CM | POA: Diagnosis not present

## 2020-05-09 DIAGNOSIS — I10 Essential (primary) hypertension: Secondary | ICD-10-CM | POA: Diagnosis not present

## 2020-05-09 DIAGNOSIS — I4891 Unspecified atrial fibrillation: Secondary | ICD-10-CM | POA: Diagnosis not present

## 2020-05-09 DIAGNOSIS — M8580 Other specified disorders of bone density and structure, unspecified site: Secondary | ICD-10-CM | POA: Diagnosis not present

## 2020-05-09 DIAGNOSIS — J449 Chronic obstructive pulmonary disease, unspecified: Secondary | ICD-10-CM | POA: Diagnosis not present

## 2020-05-09 DIAGNOSIS — E785 Hyperlipidemia, unspecified: Secondary | ICD-10-CM | POA: Diagnosis not present

## 2020-05-11 ENCOUNTER — Other Ambulatory Visit: Payer: Self-pay

## 2020-05-11 ENCOUNTER — Other Ambulatory Visit: Payer: Self-pay | Admitting: Emergency Medicine

## 2020-05-11 ENCOUNTER — Emergency Department (HOSPITAL_COMMUNITY)
Admission: EM | Admit: 2020-05-11 | Discharge: 2020-05-11 | Disposition: A | Discharge status: Home / Self Care | Payer: Medicare Other | Attending: Emergency Medicine | Admitting: Emergency Medicine

## 2020-05-11 ENCOUNTER — Emergency Department (HOSPITAL_COMMUNITY): Payer: Medicare Other

## 2020-05-11 ENCOUNTER — Encounter (HOSPITAL_COMMUNITY): Payer: Self-pay

## 2020-05-11 DIAGNOSIS — J449 Chronic obstructive pulmonary disease, unspecified: Secondary | ICD-10-CM | POA: Insufficient documentation

## 2020-05-11 DIAGNOSIS — I10 Essential (primary) hypertension: Secondary | ICD-10-CM | POA: Insufficient documentation

## 2020-05-11 DIAGNOSIS — W19XXXA Unspecified fall, initial encounter: Secondary | ICD-10-CM | POA: Diagnosis not present

## 2020-05-11 DIAGNOSIS — Z79899 Other long term (current) drug therapy: Secondary | ICD-10-CM | POA: Diagnosis not present

## 2020-05-11 DIAGNOSIS — M47812 Spondylosis without myelopathy or radiculopathy, cervical region: Secondary | ICD-10-CM | POA: Diagnosis not present

## 2020-05-11 DIAGNOSIS — S22080A Wedge compression fracture of T11-T12 vertebra, initial encounter for closed fracture: Secondary | ICD-10-CM | POA: Diagnosis not present

## 2020-05-11 DIAGNOSIS — M549 Dorsalgia, unspecified: Secondary | ICD-10-CM | POA: Diagnosis not present

## 2020-05-11 DIAGNOSIS — R0602 Shortness of breath: Secondary | ICD-10-CM | POA: Diagnosis not present

## 2020-05-11 DIAGNOSIS — W010XXA Fall on same level from slipping, tripping and stumbling without subsequent striking against object, initial encounter: Secondary | ICD-10-CM | POA: Insufficient documentation

## 2020-05-11 DIAGNOSIS — Z87891 Personal history of nicotine dependence: Secondary | ICD-10-CM | POA: Diagnosis not present

## 2020-05-11 DIAGNOSIS — R52 Pain, unspecified: Secondary | ICD-10-CM | POA: Diagnosis not present

## 2020-05-11 DIAGNOSIS — M545 Low back pain, unspecified: Secondary | ICD-10-CM | POA: Diagnosis not present

## 2020-05-11 DIAGNOSIS — S299XXA Unspecified injury of thorax, initial encounter: Secondary | ICD-10-CM | POA: Diagnosis present

## 2020-05-11 MED ORDER — TRAMADOL HCL 50 MG PO TABS: 50.0000 mg | ORAL_TABLET | Freq: Four times a day (QID) | ORAL | 0 refills | Status: AC | PRN

## 2020-05-11 MED ORDER — HYDROCODONE-ACETAMINOPHEN 5-325 MG PO TABS
1.0000 | ORAL_TABLET | Freq: Once | ORAL | Status: AC
  Administered 2020-05-11: 1 via ORAL
  Filled 2020-05-11: qty 1

## 2020-05-11 NOTE — ED Triage Notes (Signed)
PT arrived [facility], Per EMS PT fell yesterday, caught the walker on the side of a chair, Lives alone, wheezing on arrival and pt used inhaler. NO loss of consciousness. Pt has COPD and cant talk for long periods of time without becoming shob.  Vitals 138/70 94 % on 4L HR 90

## 2020-05-11 NOTE — ED Provider Notes (Signed)
[facility] DEPT Provider Note   CSN: [id number] Arrival date & time: 05/11/20  0840     History Chief Complaint  Patient presents with  . Fall    left side upper back/shoulder pain    Ariel Gonzalez is a 79 y.o. female.  HPI She was walking in her home yesterday, tripped on her oxygen cord and fell forward injuring her hands and upper back.  She was able to get up on her own.  She is having trouble sleeping because of the back pain.  She denies headache or neck pain.  She is using her usual medications including nebulizer prior to coming here.  She has palliative care status, DNR, due to end-stage COPD.  She is receiving in-home consultations by palliative care staff.  She denies recent illnesses.  She is taking her usual medications.  There are no other known modifying factors.    Past Medical History:  Diagnosis Date  . Allergic rhinitis   . Arthritis    CERVICAL AND LUMBER DDD  . COPD (chronic obstructive pulmonary disease) ([place])    HFA 75% p coaching July 17, 2010  . HTN (hypertension)   . Migraine   . Osteopenia   . Overactive bladder   . Panic attacks   . Squamous cell carcinoma of skin     Patient Active Problem List   Diagnosis Date Noted  . Former smoker 05/10/2019  . Medication management 05/10/2019  . Anxiety 05/10/2019  . Pressure injury of skin 05/01/2019  . COPD exacerbation ([place]) 04/26/2019  . Atrial fibrillation with RVR ([place]) 04/26/2019  . Paroxysmal atrial fibrillation ([place]) 04/14/2019  . Coronary artery calcification 10/02/2015  . Hyperlipidemia 10/02/2015  . Cough 06/13/2015  . Allergic rhinitis 10/05/2012  . UPPER RESPIRATORY INFECTION 08/28/2010  . Chronic respiratory failure with hypoxia ([place]) 07/17/2010  . COPD (chronic obstructive pulmonary disease) ([place]) 04/06/2007    Past Surgical History:  Procedure Laterality Date  . [place]  2010  . CATARACTS Bilateral 2009  . CHOLECYSTECTOMY    . JAW  BONE GRAFT  2009  . TOTAL ABDOMINAL HYSTERECTOMY W/ BILATERAL SALPINGOOPHORECTOMY       OB History   No obstetric history on file.     Family History  Problem Relation Age of Onset  . Cancer Mother   . Breast cancer Mother   . Heart attack Father   . Coronary artery disease Father   . Cancer Sister   . Breast cancer Sister     Social History   Tobacco Use  . Smoking status: Former Smoker    Packs/day: 1.00    Years: 50.00    Pack years: 50.00    Types: Cigarettes    Quit date: 04/01/2008    Years since quitting: 12.1  . Smokeless tobacco: Never Used  . Tobacco comment: Counseled to remain smoke free  Vaping Use  . Vaping Use: Never used  Substance Use Topics  . Alcohol use: No    Alcohol/week: 0.0 standard drinks  . Drug use: No    Home Medications Prior to Admission medications   Medication Sig Start Date End Date Taking? Authorizing Provider  albuterol (PROVENTIL) (2.5 MG/3ML) 0.083% nebulizer solution USE 1 VIAL IN NEBULIZER EVERY 6 HOURS AS NEEDED FOR WHEEZING OR SHORTNESS OF BREATH 03/29/20   Collene Gobble, MD  arformoterol (BROVANA) 15 MCG/2ML NEBU Take 2 mLs (15 mcg total) by nebulization 2 (two) times daily. 06/20/19   Collene Gobble, MD  atorvastatin (LIPITOR) 40 MG tablet TAKE 1 TABLET BY MOUTH ONCE DAILY 01/18/17   Nahser, Wonda Cheng, MD  budesonide (PULMICORT) 0.5 MG/2ML nebulizer solution Take 2 mLs (0.5 mg total) by nebulization 2 (two) times daily. 12/06/19   Collene Gobble, MD  buPROPion (WELLBUTRIN XL) 150 MG 24 hr tablet Take 1 tablet by mouth daily. 03/16/19   [provider]  Cholecalciferol (PA VITAMIN D-3) 2000 UNITS CAPS Take 2,000 Units by mouth daily.     [provider]  citalopram (CELEXA) 20 MG tablet Take 2 tablets (40 mg total) by mouth daily. Future refills through PCP. 07/04/19   Collene Gobble, MD  clonazePAM Bobbye Charleston) 1 MG tablet Half tablet to whole tablet twice a day for anxiety  1 tab q am for anxiety as of 02/29/2020  08/03/16   [provider]  diltiazem (CARDIZEM CD) 120 MG 24 hr capsule Take 1 capsule by mouth once daily 04/16/20   Nahser, Wonda Cheng, MD  furosemide (LASIX) 20 MG tablet Take 1 tablet (20 mg total) by mouth daily. 12/20/19   Nahser, Wonda Cheng, MD  Melatonin 5 MG CAPS Take 2 each by mouth. 2 tabs each night    [provider]  montelukast (SINGULAIR) 10 MG tablet Take 1 tablet (10 mg total) by mouth at bedtime. 09/14/16   Javier Glazier, MD  Multiple Vitamin (MULTIVITAMIN) tablet Take 1 tablet by mouth daily.      [provider]  OXYGEN Inhale 4 L into the lungs continuous.     [provider]  potassium chloride (KLOR-CON) 10 MEQ tablet Take 1 tablet (10 mEq total) by mouth daily. 12/20/19   Nahser, Wonda Cheng, MD  predniSONE (DELTASONE) 10 MG tablet Take 1 tablet by mouth once daily with breakfast 11/07/19   Collene Gobble, MD  predniSONE (DELTASONE) 20 MG tablet Take 1 tablet (20 mg total) by mouth daily with breakfast. 02/29/20   Parrett, Fonnie Mu, NP  Revefenacin (YUPELRI) 175 MCG/3ML SOLN Inhale 3 mLs into the lungs daily. 07/27/19   Collene Gobble, MD  rivaroxaban (XARELTO) 20 MG TABS tablet Take 1 tablet (20 mg total) by mouth daily with supper. 12/20/19   Nahser, Wonda Cheng, MD  traMADol (ULTRAM) 50 MG tablet Take 1 tablet (50 mg total) by mouth every 6 (six) hours as needed for moderate pain. 05/11/20   Daleen Bo, MD  valsartan (DIOVAN) 80 MG tablet Take 1 tablet (80 mg total) by mouth daily. 04/23/20   Nahser, Wonda Cheng, MD  VENTOLIN HFA 108 [phone] Base) MCG/ACT inhaler INHALE 2 PUFFS EVERY 4 HOURS AS NEEDED 02/04/16   Collene Gobble, MD    Allergies    Cefaclor, Clarithromycin, Erythromycin, and Iodine  Review of Systems   Review of Systems  All other systems reviewed and are negative.   Physical Exam Updated Vital Signs BP 121/75 (BP Location: Right Arm)   Pulse 84   Temp 98.6 F (37 C) (Oral)   Resp 18   Ht 5' 6.5" (1.689 m)   Wt 73.9 kg    SpO2 91%   BMI 25.91 kg/m   Physical Exam Vitals and nursing note reviewed.  Constitutional:      General: She is not in acute distress.    Appearance: She is well-developed. She is not ill-appearing, toxic-appearing or diaphoretic.  HENT:     Head: Normocephalic and atraumatic.     Right Ear: External ear normal.     Left Ear: External  ear normal.  Eyes:     Conjunctiva/sclera: Conjunctivae normal.     Pupils: Pupils are equal, round, and reactive to light.  Neck:     Trachea: Phonation normal.  Cardiovascular:     Rate and Rhythm: Normal rate and regular rhythm.     Heart sounds: Normal heart sounds.  Pulmonary:     Effort: Pulmonary effort is normal.     Comments: Decreased air movement bilaterally with scattered rhonchi and a few end expiratory wheezes.  She has audible external wheezing, and increased work of breathing with pursed lips noticed while taking breaths.  She is not in respiratory distress. Abdominal:     Palpations: Abdomen is soft.     Tenderness: There is no abdominal tenderness.  Musculoskeletal:        General: Normal range of motion.     Cervical back: Normal range of motion and neck supple.     Comments: Diffusely tender along the thoracic spine, lumbar spine and paravertebral musculature of the lumbar region.  No deformity appreciated.  She is able to sit up on the stretcher without significant pain.  Skin:    General: Skin is warm and dry.     Comments: Superficial linear abrasion left upper back, over the left scapula.  No bleeding or swelling at this area.  Neurological:     Mental Status: She is alert and oriented to person, place, and time.     Cranial Nerves: No cranial nerve deficit.     Sensory: No sensory deficit.     Motor: No abnormal muscle tone.     Coordination: Coordination normal.  Psychiatric:        Mood and Affect: Mood normal.        Behavior: Behavior normal.        Thought Content: Thought content normal.        Judgment:  Judgment normal.     ED Results / Procedures / Treatments   Labs (all labs ordered are listed, but only abnormal results are displayed) Labs Reviewed - No data to display  EKG None  Radiology DG Thoracic Spine 2 View  Result Date: 05/11/2020 CLINICAL DATA:  79 year old with recent fall and pain. EXAM: THORACIC SPINE 2 VIEWS COMPARISON:  Chest radiograph 02/29/2020 and lumbar spine radiographs on 05/11/2020 FINDINGS: Compression deformity along the superior endplate of K74 is new from the prior chest radiograph. This compression deformity is approximately 25%. Findings are compatible with a vertebral body compression fracture. Alignment of the thoracic spine is within normal limits. Evidence for degenerative changes in lower cervical spine. IMPRESSION: New vertebral body compression fracture involving the superior endplate of Q59. Approximately 25% loss of vertebral body height at this level. Electronically Signed   By: Markus Daft M.D.   On: 05/11/2020 10:36   DG Lumbar Spine Complete  Result Date: 05/11/2020 CLINICAL DATA:  Fall with pain. EXAM: LUMBAR SPINE - COMPLETE 4+ VIEW COMPARISON:  Thoracic spine radiographs 05/11/2020. CT of the abdomen and pelvis 02/24/2019 FINDINGS: Minimal curvature in the lumbar spine. Negative for a pars defect. There is superior compression deformity involving the T12 vertebral body that is better visualized on the recent thoracic spine radiographs. There is vertebral body height loss involving the L3 vertebral body. Compression deformity along the inferior endplate of L3 is new since 2020 but age indeterminate. Approximately 25% vertebral body height loss at L3. Again noted is disc space narrowing at L4-L5 and L5-S1. Atherosclerotic calcifications in the aorta. IMPRESSION: 1. T12  vertebral body compression fracture. Suspect this fracture is relatively new since it was not present on a chest radiograph from 02/29/2020. 2. L3 vertebral body compression fracture is  age indeterminate but new since 2020. Based on the sclerotic margins, favor chronic injury but recommend clinical correlation. Compression fractures could be better evaluated with MRI to evaluate for acute versus chronic injury. Electronically Signed   By: Markus Daft M.D.   On: 05/11/2020 10:43    Procedures Procedures (including critical care time)  Medications Ordered in ED Medications  HYDROcodone-acetaminophen (NORCO/VICODIN) 5-325 MG per tablet 1 tablet (1 tablet Oral Given 05/11/20 2956)    ED Course  I have reviewed the triage vital signs and the nursing notes.  Pertinent labs & imaging results that were available during my care of the patient were reviewed by me and considered in my medical decision making (see chart for details).    MDM Rules/Calculators/A&P                           Patient Vitals for the past 24 hrs:  BP Temp Temp src Pulse Resp SpO2 Height Weight  05/11/20 1017 121/75 -- -- 84 18 91 % -- --  05/11/20 0902 -- -- -- -- -- -- 5' 6.5" (1.689 m) 73.9 kg  05/11/20 0855 (!) 135/95 -- -- -- -- -- -- --  05/11/20 0851 -- 98.6 F (37 C) Oral (!) 110 -- 96 % -- --    At discharge- reevaluation with update and discussion. After initial assessment and treatment, an updated evaluation reveals she is more comfortable resting easily.  Findings discussed with the patient and all questions were answered. Daleen Bo   Medical Decision Making:  This patient is presenting for evaluation of injury to back yesterday during a mechanical fall, which does require a range of treatment options, and is a complaint that involves a moderate risk of morbidity and mortality. The differential diagnoses include contusion, fracture, visceral injury. I decided to review old records, and in summary elderly patient with end-stage COPD, on palliative care, presenting with injuries to back after fall yesterday.  I did not require additional historical information from  anyone.   Radiologic Tests Ordered, included thoracic and lumbar spine images.  I independently Visualized: Radiographic images, which show T12 compression fracture, likely new, about 25% compression    Critical Interventions-clinical evaluation, radiology imaging, observation reassessment  After These Interventions, the Patient was reevaluated and was found improved, stable for discharge.  Pain likely secondary to new compression fracture T12, without signs of neurologic compromise.  Patient can be treated symptomatically, with expectant management as an outpatient.  CRITICAL CARE-no Performed by: Daleen Bo  Nursing Notes Reviewed/ Care Coordinated Applicable Imaging Reviewed Interpretation of Laboratory Data incorporated into ED treatment  The patient appears reasonably screened and/or stabilized for discharge and I doubt any other medical condition or other [facility] requiring further screening, evaluation, or treatment in the ED at this time prior to discharge.  Plan: Home Medications-continue usual; Home Treatments-gradually increase activity; return here if the recommended treatment, does not improve the symptoms; Recommended follow up-PCP follow-up 1 week, and as needed     Final Clinical Impression(s) / ED Diagnoses Final diagnoses:  Compression fracture of T12 vertebra, initial encounter ([place])  Chronic obstructive pulmonary disease, unspecified COPD type ([place])    Rx / DC Orders ED Discharge Orders         Ordered    traMADol Veatrice Bourbon)  50 MG tablet  Every 6 hours PRN        05/11/20 1139           Daleen Bo, MD 05/11/20 1227

## 2020-05-11 NOTE — ED Notes (Signed)
Pt in bed resting, respirations even and unlabored, denies any needs at this time.

## 2020-05-11 NOTE — Discharge Instructions (Addendum)
You have sustained a T12 compression fracture that is causing your back pain.  Treatment for this includes rest, and being careful when walking.  We are prescribing a pain reliever, tramadol to use if needed for severe pain.  You also might get benefit from using Tylenol for pain.  Please call your doctor to arrange a follow-up appointment to be seen in a week or so to get a checkup.

## 2020-05-14 ENCOUNTER — Telehealth: Payer: Self-pay | Admitting: Emergency Medicine

## 2020-05-14 ENCOUNTER — Telehealth: Payer: Self-pay | Admitting: *Deleted

## 2020-05-14 MED ORDER — PREDNISONE 10 MG PO TABS: ORAL_TABLET | ORAL | 5 refills | Status: AC

## 2020-05-14 NOTE — Telephone Encounter (Signed)
Pt is requesting refill on predniSONE 10 MG Oral Tablet next ov 05/29/20

## 2020-05-14 NOTE — Telephone Encounter (Signed)
Spoke with daughter, needs refill on maintenance 10mg  daily prednisone.  This has been sent to preferred pharmacy.  Nothing further needed at this time- will close encounter.

## 2020-05-14 NOTE — Telephone Encounter (Signed)
Received a message that patient's daughter, [name], called requesting a return call. Her mom had a fall on Friday 10/29 and was sent to [facility]. She was found to have a T-12 compression fracture. She was discharged with an order for Tramadol 50 mg q6h prn. She continued to have pain so was given a prescription for Norco 5/325mg , 1-2 tablets Q6h prn from on-call MD with Dr. Lamonte Sakai. Daughter is requesting a visit from Palliative care to discuss care options at home. Visit scheduled for 11/5 at 1230p.

## 2020-05-16 ENCOUNTER — Telehealth: Payer: Self-pay | Admitting: *Deleted

## 2020-05-16 NOTE — Telephone Encounter (Signed)
Received a communication from patient's daughter, [name], stating that her mother had another fall yesterday while the hired caregiver was present, who comes every Wednesday. Her fall did not warrant another visit to the ED, however she did sustain some cuts on her arms and legs. She did also want to inform me that on our palliative visit scheduled tomorrow at 12:30p, her brother Dionicio Stall will be joining [place].

## 2020-05-17 ENCOUNTER — Other Ambulatory Visit: Payer: Medicare Other | Admitting: *Deleted

## 2020-05-17 ENCOUNTER — Other Ambulatory Visit: Payer: Medicare Other

## 2020-05-17 ENCOUNTER — Other Ambulatory Visit: Payer: Self-pay

## 2020-05-17 DIAGNOSIS — Z515 Encounter for palliative care: Secondary | ICD-10-CM

## 2020-05-17 NOTE — Progress Notes (Signed)
COMMUNITY PALLIATIVE CARE SW NOTE  PATIENT NAME: Ariel Gonzalez DOB: 07-15-1940 MRN: [id number]  PRIMARY CARE PROVIDER: Gaynelle Arabian, MD  RESPONSIBLE PARTY:  Acct ID - Guarantor Home Phone Work Phone Relationship Acct Type  [id number] Auburn [phone]  Self P/F     [address] [place], [place],  [address]     PLAN OF CARE and INTERVENTIONS:             1. GOALS OF CARE/ ADVANCE CARE PLANNING: Goal is for patient to remain in her home, as independent as possible. Patent is a DNR, form in the home. 2. SOCIAL/EMOTIONAL/SPIRITUAL ASSESSMENT/ INTERVENTIONS:  SW and RN-Monishia Nadara Mustard completed a home visit. SW and RN sat and talked with patient's daughter-Sandy at length about recet changes to patient's condition: patient had two recent falls and sustained several skin tears (2nd fall), ongoing back pain, patient has a compression fracture(first fall); increased care needs due to the falls and overall decline. Patient's breathing issues persist. Patient was in bed sleeping during visit and team observed patient sleeping and did not appear to be in any pain or distress. Patient's daughter had questions regarding DME, pain and anxiety management and increased care needs. Daughter has a caregiver through [employer] agency on Wednesdays and she will seek to increase those hours. Daughter interested in a hospital bed, Rolator and purewick urinary drainage system for night-time use and a life alert button.Team provided education and support of DME to help ease PCG assistance personal care. Patient needs stand assistance as she is no longer to stand independently. Patient requires assistance with ambulation with her walker. SW provided supportive presence, active listening, assessment of needs, comfort, education, safety and coping, education and reassurance of support. SW will provide ongoing supportive visits to patient and family.  3. PATIENT/CAREGIVER EDUCATION/ COPING:  Patient was sleeping  during this visit. Her daughter is being proactive in seeking resources and support for patient. PCG is coping adequately. Patient has supportive family, neighbors and extended family members. 4. PERSONAL EMERGENCY PLAN:  911 can be activated for emergencies. Patient has family nearby. 5. COMMUNITY RESOURCES COORDINATION/ HEALTH CARE NAVIGATION: Patient has a private caregiver 1x week through [employer]. PCG is seeking to extend that. 6. FINANCIAL/LEGAL CONCERNS/INTERVENTIONS:  None.     SOCIAL HX:  Social History   Tobacco Use  . Smoking status: Former Smoker    Packs/day: 1.00    Years: 50.00    Pack years: 50.00    Types: Cigarettes    Quit date: 04/01/2008    Years since quitting: 12.1  . Smokeless tobacco: Never Used  . Tobacco comment: Counseled to remain smoke free  Substance Use Topics  . Alcohol use: No    Alcohol/week: 0.0 standard drinks    CODE STATUS: DNR ADVANCED DIRECTIVES: Yes MOST FORM COMPLETE:  Yes HOSPICE EDUCATION PROVIDED: No  PPS: Patient requires assistance with ambulation. Patient is dependent for personal care and ADLs.  Duration of visit and documentation:  120 minutes      Katheren Puller, LCSW

## 2020-05-20 NOTE — Progress Notes (Addendum)
COMMUNITY PALLIATIVE CARE RN NOTE  PATIENT NAME: Ariel Gonzalez DOB: 04-Jan-1941 MRN: [id number]  PRIMARY CARE PROVIDER: Gaynelle Arabian, MD  RESPONSIBLE PARTY: Ariel Gonzalez (daughter) Acct ID - Guarantor Home Phone Work Phone Relationship Acct Type  [id number] Auburn [phone]  Self P/F     [place], [place], [place] [address]   Covid-19 Pre-screening Negative  PLAN OF CARE and INTERVENTION:  1. ADVANCE CARE PLANNING/GOALS OF CARE: Goal is for patient to remain in her home.  2. PATIENT/CAREGIVER EDUCATION: Symptom management, dyspnea management, safe mobility, fall prevention, disease progression 3. DISEASE STATUS: Joint visit made with Palliative care SW, Ariel Gonzalez. Met with patient and daughter, Ariel Gonzalez, in patient's home. Patient remained asleep in her room throughout visit. Daughter is concerned regarding patient's overall condition. She had a recent fall where she sustained a T12 compression fracture followed by another fall 2 days ago resulting in a large skin tear to her left shin, left knee and right forearm. She has rearranged patient's furniture to help with home safety and to prevent falls. She was initially prescribed Tramadol after her ED visit, but this was ineffective in managing pain. She now has Norco that she is taking about every 4 hours, which is working well. Patient is weaker and no longer able to stand without assistance. She is able to ambulate while her daughter supports her waist. She requires assistance with all ADLs. She continues on her nebulizers to help with her breathing and daily Klonopin for her anxiety. She becomes very short of breath with any exertion. We discussed several things to help care for patient more effectively such as a rollator walker, hospital bed, increasing care giver hours and a Life alert system. Her daughter is interested in purchasing a Mabel urinary drainage system for patient to utilize during the night. Daughter states  that last night it took her 45 minutes to assist patient to and from the bathroom to urinate. She is intermittently incontinent and wears Depends. She has a hired caregiver on Wednesdays for 4 hours, (12p-4p) through [employer] home agency. We reviewed her current medications and regimen. Daughter is currently staying with patient 24/7 with the assistance of family members to give her some relief. She lives close-by. She is also putting in for FMLA to have more time to assist patient. Went into patient's room at end of visit. She is sleeping soundly without any physical indicators of pain. No respiratory distress noted. She is wearing her oxygen on at 4.5L on a continuous basis. Current dressings for skin tears are dry and intact. Daughter is appreciative of visit. Will continue to monitor.   HISTORY OF PRESENT ILLNESS: This is a 79 yo female with a history of COPD, Afib, hyperlipidemia and anxiety. Palliative care team continues to follow patient. Will visit patient monthly and PRN.    CODE STATUS: DNR ADVANCED DIRECTIVES: Y MOST FORM: yes PPS: 30%    (Duration of visit and documentation 120 minutes)   Daryl Eastern, RN BSN

## 2020-06-04 ENCOUNTER — Telehealth: Payer: Self-pay | Admitting: Emergency Medicine

## 2020-06-04 DIAGNOSIS — J449 Chronic obstructive pulmonary disease, unspecified: Secondary | ICD-10-CM

## 2020-06-04 MED ORDER — BUDESONIDE 0.5 MG/2ML IN SUSP: 0.5000 mg | Freq: Two times a day (BID) | RESPIRATORY_TRACT | 5 refills | Status: AC

## 2020-06-04 NOTE — Telephone Encounter (Signed)
Spoke with pt's daughter, [name]. States that pt needs a new rx for budesonide nebs. This has been sent in. Nothing further was needed.

## 2020-06-11 DIAGNOSIS — M8580 Other specified disorders of bone density and structure, unspecified site: Secondary | ICD-10-CM | POA: Diagnosis not present

## 2020-06-11 DIAGNOSIS — I10 Essential (primary) hypertension: Secondary | ICD-10-CM | POA: Diagnosis not present

## 2020-06-11 DIAGNOSIS — J449 Chronic obstructive pulmonary disease, unspecified: Secondary | ICD-10-CM | POA: Diagnosis not present

## 2020-06-11 DIAGNOSIS — I2510 Atherosclerotic heart disease of native coronary artery without angina pectoris: Secondary | ICD-10-CM | POA: Diagnosis not present

## 2020-06-11 DIAGNOSIS — I4891 Unspecified atrial fibrillation: Secondary | ICD-10-CM | POA: Diagnosis not present

## 2020-06-11 DIAGNOSIS — E785 Hyperlipidemia, unspecified: Secondary | ICD-10-CM | POA: Diagnosis not present

## 2020-06-11 DIAGNOSIS — E7800 Pure hypercholesterolemia, unspecified: Secondary | ICD-10-CM | POA: Diagnosis not present

## 2020-06-11 DIAGNOSIS — F331 Major depressive disorder, recurrent, moderate: Secondary | ICD-10-CM | POA: Diagnosis not present

## 2020-06-25 ENCOUNTER — Telehealth: Payer: Self-pay | Admitting: *Deleted

## 2020-06-25 NOTE — Telephone Encounter (Signed)
Called and left a message with patient's daughter, [name], to schedule a Palliative care home visit. Contact information left for return call.

## 2020-07-10 ENCOUNTER — Other Ambulatory Visit: Payer: Medicare Other | Admitting: *Deleted

## 2020-07-10 ENCOUNTER — Other Ambulatory Visit: Payer: Self-pay

## 2020-07-10 DIAGNOSIS — Z515 Encounter for palliative care: Secondary | ICD-10-CM

## 2020-07-18 DIAGNOSIS — F331 Major depressive disorder, recurrent, moderate: Secondary | ICD-10-CM | POA: Diagnosis not present

## 2020-07-18 DIAGNOSIS — J449 Chronic obstructive pulmonary disease, unspecified: Secondary | ICD-10-CM | POA: Diagnosis not present

## 2020-07-18 DIAGNOSIS — E7800 Pure hypercholesterolemia, unspecified: Secondary | ICD-10-CM | POA: Diagnosis not present

## 2020-07-18 DIAGNOSIS — M8580 Other specified disorders of bone density and structure, unspecified site: Secondary | ICD-10-CM | POA: Diagnosis not present

## 2020-07-18 DIAGNOSIS — I2510 Atherosclerotic heart disease of native coronary artery without angina pectoris: Secondary | ICD-10-CM | POA: Diagnosis not present

## 2020-07-18 DIAGNOSIS — I4891 Unspecified atrial fibrillation: Secondary | ICD-10-CM | POA: Diagnosis not present

## 2020-07-18 DIAGNOSIS — I10 Essential (primary) hypertension: Secondary | ICD-10-CM | POA: Diagnosis not present

## 2020-07-18 DIAGNOSIS — E785 Hyperlipidemia, unspecified: Secondary | ICD-10-CM | POA: Diagnosis not present

## 2020-07-23 NOTE — Progress Notes (Signed)
COMMUNITY PALLIATIVE CARE RN NOTE  PATIENT NAME: LORELLA GOMEZ DOB: 1940-08-02 MRN: [id number]  PRIMARY CARE PROVIDER: Gaynelle Arabian, MD  RESPONSIBLE PARTY: Coralee Rud (daughter) Acct ID - Guarantor Home Phone Work Phone Relationship Acct Type  [id number] Auburn [phone]  Self P/F     [address] [place], [place], [place] [address]   Due to the COVID-19 crisis, this virtual check-in visit was done via telephone from my office and it was initiated and consent by this patient and or family.  PLAN OF CARE and INTERVENTION:  1. ADVANCE CARE PLANNING/GOALS OF CARE: Goal is for patient to remain in her home and avoid hospitalizations. She has a DNR. 2. PATIENT/CAREGIVER EDUCATION: Symptom management, pain management, safe mobility/transfers, fall prevention 3. DISEASE STATUS: Virtual check-in visit completed via telephone. Patient had a recent fall and sustained a skin tear to her arm. Her pain is controlled with Norco when needed. She ambulates using her rollator walker. She requires 1 person assistance with ADLs. She is using a bedside commode during the night to help prevent falls. She is also intermittently incontinent of urine and wears Depends. Patient continues to become short of breath with any exertion. She is oxygen dependent and wears at 4.5L/min via nasal cannula. She continues on routine nebulizer treatments and anti-anxiety medication to help. They have increased her hired caregiver to 3x/week to assist her with personal care, household chores and for companionship. She is sleeping more overall. She does get up daily to sit in her recliner in her sunroom. Will continue to monitor.   HISTORY OF PRESENT ILLNESS: This is a 81 yo female with a history of COPD, Afib, hyperlipidemia and anxiety. Palliative care team continues to follow patient. Will visit patient monthly and PRN   CODE STATUS: DNR  ADVANCED DIRECTIVES: Y MOST FORM: yes PPS: 30%   (Duration of visit and  documentation 30 minutes)   Daryl Eastern, RN BSN

## 2020-07-24 ENCOUNTER — Other Ambulatory Visit: Payer: Medicare Other | Admitting: *Deleted

## 2020-07-24 ENCOUNTER — Other Ambulatory Visit: Payer: Self-pay

## 2020-07-24 ENCOUNTER — Telehealth: Payer: Self-pay | Admitting: Adult Health

## 2020-07-24 DIAGNOSIS — Z515 Encounter for palliative care: Secondary | ICD-10-CM

## 2020-07-24 MED ORDER — PREDNISONE 10 MG PO TABS: ORAL_TABLET | ORAL | 0 refills | Status: AC

## 2020-07-24 NOTE — Telephone Encounter (Signed)
Spoke with patient's daughter [name]. She stated that she has noticed several changes in the patient over the last week. Patient has had an increase in SOB, deceased energy and interest in doing things that she normally likes to do. She has been using her Candiss Norse and Pulmicort as prescribed. She is also on 4.5L of O2. O2 stats today have been 93-94% on O2.   She wanted to know if it was a good idea to increase the clonazepam to 1mg  in the morning and 1mg  at bedtime. She is currently taking 1/2 tablet in the morning and bedtime. She seems to be more anxious. She is wondering if the anxiety is affecting her breathing.   She also reported that the patient has been using her albuterol neb more than prescribed. She will often take 2 breathing treatments back to back if she feels the first treatment did not help her SOB. I explained to her that the breathing treatments needed to be spaced out more, per the RX, she can do one treatment every 6 hours as needed. She wanted to know if anything else beside albuterol for her to use, such as DuoNeb.   They have contacted the palliative care nurse and the nurse advised that she call here.   RB, can you please advise? Thanks!

## 2020-07-24 NOTE — Telephone Encounter (Signed)
If Ariel Gonzalez is having more SOB then we could consider a brief increase in her prednisone and then back down to her usual 10mg . Difficult to know if her anxiety is driving her SOB or vice versa. I think we should only change one med at a time so we are clear on their effects >> I would propose giving a brief pred taper to see how she responds, but if no change in her sx then would be ok increasing the clonazepam as [name] has said.   Pred taper >> Take 40mg  daily for 3 days, then 30mg  daily for 3 days, then 20mg  daily for 3 days, then back to usual 10mg 

## 2020-07-24 NOTE — Telephone Encounter (Signed)
Called and spoke with pt's daughter [name] letting her know the info stated by RB and that we were going to send in a short pred taper to pharmacy and then pt will continue on 10mg  dialy. Sandy verbalized understanding. Verified preferred pharmacy and sent Rx in for pt. Nothing further needed.

## 2020-08-07 ENCOUNTER — Telehealth: Payer: Self-pay | Admitting: Emergency Medicine

## 2020-08-07 ENCOUNTER — Other Ambulatory Visit: Payer: Medicare Other | Admitting: *Deleted

## 2020-08-07 ENCOUNTER — Other Ambulatory Visit: Payer: Self-pay

## 2020-08-07 ENCOUNTER — Other Ambulatory Visit: Payer: Self-pay | Admitting: Cardiovascular Disease

## 2020-08-07 ENCOUNTER — Telehealth: Payer: Self-pay | Admitting: *Deleted

## 2020-08-07 VITALS — BP 139/100 | HR 171 | Temp 98.0°F | Resp 24

## 2020-08-07 DIAGNOSIS — Z515 Encounter for palliative care: Secondary | ICD-10-CM

## 2020-08-07 MED ORDER — YUPELRI 175 MCG/3ML IN SOLN: 175.0000 ug | Freq: Every day | RESPIRATORY_TRACT | 0 refills | Status: AC

## 2020-08-07 MED ORDER — DILTIAZEM HCL 30 MG PO TABS: 30.0000 mg | ORAL_TABLET | Freq: Four times a day (QID) | ORAL | 6 refills | Status: AC | PRN

## 2020-08-07 NOTE — Progress Notes (Signed)
Blanka has had a decline in her health recently Is being seen by paliative care HR has been rapid - I'm sure this is rapid atrial fib BP is elevated  Her daughter [name]) called me to discuss She has given her some extra Klonipin I offered to see Lelon Frohlich tomorrow at 3 but [name] said that at this point it would be a tremendous challenge to get her to the office. As such, we will continue empiric treatments and use our home health and palliative care nurses as our main on site providers.  Will add Diltiazem 30 mg tabs to take 4 times a day as needed for tachycardia ( assuming BP is high enough)   [name] will keep [place] informed of her status.  She may need a consult to hospice at some point in the near future.    Mertie Moores, MD  08/07/2020 5:38 PM    [address],  [place], [place]  [address] Phone: [phone]; Fax: [phone]

## 2020-08-07 NOTE — Telephone Encounter (Signed)
TP please advise on this message since you last seen her on 02/29/2020 and RB is out of the office today.  Pt is having increased anxiety and panic attacks.  Monisha from [facility] feels that her [facility] and COPD is getting worse.  They are wanting rx for liquid morphine to be sent in for the pt to help with this.  TP please advise. Thanks   Allergies  Allergen Reactions  . Cefaclor     rash  . Clarithromycin     rash  . Erythromycin     rash  . Iodine     rash

## 2020-08-07 NOTE — Telephone Encounter (Signed)
Form has been signed by CY in place of RB.     I have left a sample up front for the pts daughter to come by and pick up of the Yuperlri so she does not run out before her shipment is sent.    Palliative care is coming to see the pt at 10"30 this morning and they are going to discuss her anxiety at that time.

## 2020-08-07 NOTE — Telephone Encounter (Signed)
4:18p  Received a communication from patient's daughter, [name], asking if I had heard anything from Pulmonology. I advised that I reviewed Epic and the notes state that Dr. Lamonte Sakai is out of the office today and that the call was re-routed to the PA so we are awaiting a return call. Communication also stated that patient's heart rate is back elevated ranging between 120s to 140s for the past 2 hours while she was napping after I visited with her this am. During my earlier visit her HR was 173 and after taking her Cardizem came down to 90s-120s.   4:37p I called and spoke with [name] to advise that we need to reach out to her Cardiologist d/t her elevated HR since she has a history of afib. Educated on all the side effects of uncontrolled afib with elevated HR and possible negative outcomes if her HR continues at this pace. [name] advised she will reach out to her Cardiologist now to see what he recommends. She is appreciative.

## 2020-08-07 NOTE — Progress Notes (Addendum)
COMMUNITY PALLIATIVE CARE RN NOTE  PATIENT NAME: Ariel Gonzalez DOB: 1941-03-06 MRN: [id number]  PRIMARY CARE PROVIDER: Gaynelle Arabian, MD  RESPONSIBLE PARTY: Olam Idler (daughter) Acct ID - Guarantor Home Phone Work Phone Relationship Acct Type  [id number] Auburn [phone]  Self P/F     [place], [place], [place] [address]   Covid-19 Pre-screening Negative  PLAN OF CARE and INTERVENTION:  1. ADVANCE CARE PLANNING/GOALS OF CARE: Patient wants to remain at home and avoid hospitalizations.  2. PATIENT/CAREGIVER EDUCATION: Symptom management, dyspnea/anxiety management, bowel regimen and s/s of infection 3. DISEASE STATUS: PRN RN visit requested by daughter. Patient has been experiencing increased shortness of breath and anxiety. Reports patient is having panic attacks. She denies pain. Upon arrival, she is laying in her hospital bed with head of bed elevated. She reports shortness of breath at rest, during conversation and with exertion and it is apparent. Audible wheezing noted in all lung fields. She recently completed a prednisone taper but feels that this time she has not noticed any difference/improvement in her breathing as she has noted in the past. She is oxygen dependent at 4.5 L/min via [place]. Her oxygen level is 95%. Her pulse is also elevated at 170-173. She had not taken her am medications other than her Klonopin as of yet. I had her take this medication while I am present. She had an Albuterol nebulizer treatment at 4am and her Klonopin at [facility]. She does not feel that the Klonopin is really effective in managing her anxiety or ease of breathing. She also took her Maretta Bees this am then her Brovana and Pulmicort. She is using her Albuterol neb about every 6 hours. She denies pain. She is also c/o stomach upset. She took some Pepto Bismol which helped some. She had 2 small bowel movements this morning. She reports needing to use the bathroom in her room. Her daughter had to  assist her and transport her into the bathroom using the rollator walker as patient is progressively weaker and more short of breath with any exertion. She has a semi-productive cough. Phlegm is clear. She occasionally has coughing spells which usually occurs after exertion or nebulizer treatments. Sometimes she will take Delsym to help. She reports not having an appetite and that her intake is declining. She does try to eat something so she will not take her medications on an empty stomach. She is spending most of her time in bed, but does get up to kitchen or recliner about once daily. [name] has contacted Pulmonology to let them know about her anxiety and panic attacks and is awaiting a return call. I will reach out to Pulmonology as well to inform of my visit. Will continue to monitor.   UPDATE: Rechecked pulse 35 min after Cardizem was given and pulse has improved to 90s-120s. However patient continues with shortness of breath at rest. I also contacted and spoke with receptionist with Pulmonology to advise that patient feels that her anti-anxiety medication is not effective. She is taking Klonopin 1mg  twice daily. Asked to see if MD feels that a medication adjustment/change would be helpful and if adding Roxanol may be beneficial. Awaiting return call.   HISTORY OF PRESENT ILLNESS:This is a 80 yo female with a history of COPD, Afib, hyperlipidemia and anxiety. Palliative care teamcontinues to follow patient. Will visit patient monthly and PRN     CODE STATUS: DNR ADVANCED DIRECTIVES: Y MOST FORM: yes PPS: 30%   PHYSICAL EXAM:   VITALS: Today's  Vitals   08/07/20 1102  BP: (!) 139/100  Pulse: (!) 171  Resp: (!) 24  Temp: 98 F (36.7 C)  TempSrc: Temporal  SpO2: 95%  PainSc: 0-No pain    LUNGS: expiratory wheezes bilaterally CARDIAC: Cor Tachy EXTREMITIES: No edema SKIN: Thin/frail skin, scattered bruising on both arms  NEURO: Alert and oriented x 2, intermittent confusion, forgetful,  increased generalized weakness, at times ambulatory w/rollator and other times requires transport d/t dyspnea and weakness    (Duration of visit and documentation 75 minutes)   Daryl Eastern, RN BSN

## 2020-08-07 NOTE — Telephone Encounter (Signed)
Last OV note has been faxed to the [facility] pharmacy.

## 2020-08-08 ENCOUNTER — Other Ambulatory Visit: Payer: Self-pay

## 2020-08-08 ENCOUNTER — Other Ambulatory Visit: Payer: Medicare Other | Admitting: *Deleted

## 2020-08-08 ENCOUNTER — Telehealth: Payer: Self-pay | Admitting: Emergency Medicine

## 2020-08-08 ENCOUNTER — Other Ambulatory Visit: Payer: Self-pay | Admitting: *Deleted

## 2020-08-08 ENCOUNTER — Non-Acute Institutional Stay: Payer: Medicare Other | Admitting: Nurse Practitioner

## 2020-08-08 VITALS — BP 118/100 | HR 141 | Temp 97.4°F | Resp 22

## 2020-08-08 DIAGNOSIS — Z515 Encounter for palliative care: Secondary | ICD-10-CM | POA: Diagnosis not present

## 2020-08-08 DIAGNOSIS — J449 Chronic obstructive pulmonary disease, unspecified: Secondary | ICD-10-CM

## 2020-08-08 DIAGNOSIS — J9611 Chronic respiratory failure with hypoxia: Secondary | ICD-10-CM | POA: Diagnosis not present

## 2020-08-08 NOTE — Progress Notes (Signed)
COMMUNITY PALLIATIVE CARE RN NOTE  PATIENT NAME: Ariel Gonzalez DOB: 1940/07/18 MRN: [id number]  PRIMARY CARE PROVIDER: Gaynelle Arabian, MD  RESPONSIBLE PARTY: Ariel Gonzalez (daughter) Acct ID - Guarantor Home Phone Work Phone Relationship Acct Type  [id number] Auburn [phone]  Self P/F     [place], [place], [place] [address]   Covid-19 Pre-screening Negative  PLAN OF CARE and INTERVENTION:  1. ADVANCE CARE PLANNING/GOALS OF CARE: Goal is for patient to remain in her home with comfort measures in place. She has a DNR. 2. PATIENT/CAREGIVER EDUCATION: Symptom management, Roxanol education, hospice education 3. DISEASE STATUS: PRN RN visit made. Met with patient and daughter, Ariel Gonzalez, in patient's home. Patient has been on a progressive overall functional decline. She continues with anxiety and shortness of breath at rest, during conversation and with any exertion. She is currently on oxygen at 4.5L via [place]. Her oxygen level is 82-88%. She continues to have issues with her Afib and elevated HR. Her Cardiologist placed her on an immediate release Cardizem 30 mg that patient can take up to 4x/day. The medication is effective temporarily, but then her HR will elevate again. During visit it is currently 141. Daughter gave another Cardizem tablet to help. Audible wheezing noted with exhalation. Abdominal breathing noticed. Patient states that she had a "very difficult" night. Daughter received a call from [place] with Pulmonology who provided patient education on her current condition and what to expect and also placed an order for a hospice consult. This order was faxed to [facility] today. Daughter feels that patient needs additional comfort medications in place to help ease patient's breathing and anxiety. Video call completed with Palliative care NP, Loyal Gambler today during my visit. She has placed an order for Roxanol 69m every 4 hours as needed for shortness of breath or pain. I contacted  [facility] prior to this to make sure they had the medication in stock and they do. Daughter will pick up medication tonight. Reviewed medication, dosage, frequency, indications of use, and possible side effects. Daughter verbalized understanding. She also had a friend present who is a CImmunologistthat also understood this education and will be present to assist daughter in administering Roxanol if needed. [facility] called and stated that they are sending a hospice admission's nurse tomorrow 08/09/20 at 10a to admit patient. Daughter is aPatent attorney   HISTORY OF PRESENT ILLNESS:  This is a 80yo female with a history of COPD, Afib, hyperlipidemia and anxiety. Palliative care teamfollowing patient but hospice order received today and AV is scheduled for tomorrow at 10a.    CODE STATUS: DNR ADVANCED DIRECTIVES: Y MOST FORM: yes PPS: 30%   PHYSICAL EXAM:   VITALS: Today's Vitals   08/08/20 1709  BP: (!) 118/100  Pulse: (!) 141  Resp: (!) 22  Temp: (!) 97.4 F (36.3 C)  TempSrc: Temporal  SpO2: (!) 82%  PainSc: 0-No pain    LUNGS: expiratory wheezes bilaterally CARDIAC: Cor Tachy EXTREMITIES: No edema SKIN: Scattered bruising to both arms  NEURO: Alert and oriented to person/place, intermittent confusion, forgetul, some anxiety, generalized weakness, requires assistance with transfers   (Duration of visit and documentation 60 minutes)   MDaryl Eastern RN BSN

## 2020-08-08 NOTE — Progress Notes (Signed)
[facility] Consult Note Telephone: [phone]  Fax: [phone]  PATIENT NAME: Ariel Gonzalez [id number] [phone] (home)  DOB: 1941/05/04 MRN: [id number]  PRIMARY CARE PROVIDER:    Gaynelle Arabian, MD,  [place] [address] [place] [address] [phone]  REFERRING PROVIDER:   Gaynelle Arabian, MD [address] [place],   [place] [address] [phone]  RESPONSIBLE PARTY:   Extended Emergency Contact Information Primary Emergency Contact: Ariel Gonzalez, [place] Mobile Phone: [phone] Relation: Daughter Secondary Emergency Contact: Ariel Gonzalez,Ariel          Gonzalez, Ariel Gonzalez of [place] Phone: [phone] Relation: Son  This visit was done via [employer] (audio-visual) from my office and it was initiated and consent by this patient and her family. HIPPA policies of confidentially were discussed. Palliative care registered nurse present and at bedside during visit.   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 Patient followed by Palliative care registered nurse for medical management of chronic issues. Nurse report patient with worsening shortness of breath at rest, symptoms ongoing and worsening in the last 2 days. Oxygen saturation reported to be mostly in the low 80s. Patient current medication include scheduled Yupelri, Brovana, Pulmicort and Albuterol as needed. She is on Prednisone 10mg  daily, recently completed Prednisone taper. Takes Klonopin 1mg  twice a day as needed for anxiety. Patient and family report poor symptoms relief with current regimen. On visual exam today, patient appeared dyspneic, stops to catch breath intermittently during interview.  No report of fever, chills or uncontrolled pain. Vital signs  completed by Nurse at bedside T 97.4, BP 118/100, P 140, 88% on 4.5L oxygen via nasal canula. Patient referred for Hospice service by pulmonology today, she is pending admission to Hospice care.  Plan: Start Morphine sulfate 20mg /ml, take 0.1ml (5mg ) every 4 hrs as needed for SOB, Quantity #22ml. Prescription sent to [facility] on [address].  I spent 25 minutes providing this consultation, time includes time spent with patient on phone, chart review, provider coordination, and documentation. More than 50% of the time in this consultation was spent counseling and coordinating communication.   CHIEF COMPLAINT: Shortness of breath at rest  HISTORY OF PRESENT ILLNESS:  [name] is a 80 y.o. year old female with multiple medical problems including COPD, Paroxysmal Afib, HTN, Osteopenia, panic attacks.  Palliative Care was asked to follow this patient by consultation request of Ariel Arabian, MD to help with symptom management.  CODE STATUS: DNR  PPS: 30%  HOSPICE ELIGIBILITY/DIAGNOSIS: Yes/ end stage COPD  PHYSICAL EXAM / ROS:  Physical exam not completed due to Tele-health visit  General: chronically ill and frail appearing, sitting up in bed, appeared uncomfortable Cardiovascular: no chest pain reported Pulmonary: no acute cough, increased SOB Psych: anxious affect  History obtained from review of EMR, interview with patient and palliative care nurse. Records reviewed and summarized bellow.  PAST MEDICAL HISTORY:  Past Medical History:  Diagnosis Date  . Allergic rhinitis   . Arthritis    CERVICAL AND LUMBER DDD  . COPD (chronic obstructive pulmonary disease) ([place])    HFA 75% p coaching July 17, 2010  . HTN (hypertension)   . Migraine   . Osteopenia   . Overactive bladder   .  Panic attacks   . Squamous cell carcinoma of skin     SOCIAL HX:  Social History   Tobacco Use  . Smoking status: Former Smoker    Packs/day: 1.00    Years:  50.00    Pack years: 50.00    Types: Cigarettes    Quit date: 04/01/2008    Years since quitting: 12.3  . Smokeless tobacco: Never Used  . Tobacco comment: Counseled to remain smoke free  Substance Use Topics  . Alcohol use: No    Alcohol/week: 0.0 standard drinks   FAMILY HX:  Family History  Problem Relation Age of Onset  . Cancer Mother   . Breast cancer Mother   . Heart attack Father   . Coronary artery disease Father   . Cancer Sister   . Breast cancer Sister     ALLERGIES:  Allergies  Allergen Reactions  . Cefaclor     rash  . Clarithromycin     rash  . Erythromycin     rash  . Iodine     rash     PERTINENT MEDICATIONS:  Outpatient Encounter Medications as of 08/08/2020  Medication Sig  . albuterol (PROVENTIL) (2.5 MG/3ML) 0.083% nebulizer solution USE 1 VIAL IN NEBULIZER EVERY 6 HOURS AS NEEDED FOR WHEEZING OR SHORTNESS OF BREATH  . arformoterol (BROVANA) 15 MCG/2ML NEBU Take 2 mLs (15 mcg total) by nebulization 2 (two) times daily.  Marland Kitchen atorvastatin (LIPITOR) 40 MG tablet TAKE 1 TABLET BY MOUTH ONCE DAILY  . budesonide (PULMICORT) 0.5 MG/2ML nebulizer solution Take 2 mLs (0.5 mg total) by nebulization 2 (two) times daily.  Marland Kitchen buPROPion (WELLBUTRIN XL) 150 MG 24 hr tablet Take 1 tablet by mouth daily.  . Cholecalciferol (PA VITAMIN D-3) 2000 UNITS CAPS Take 2,000 Units by mouth daily.   . citalopram (CELEXA) 20 MG tablet Take 2 tablets (40 mg total) by mouth daily. Future refills through PCP.  Marland Kitchen clonazePAM (KLONOPIN) 1 MG tablet Half tablet to whole tablet twice a day for anxiety  1 tab q am for anxiety as of 02/29/2020  . diltiazem (CARDIZEM CD) 120 MG 24 hr capsule Take 1 capsule by mouth once daily  . diltiazem (CARDIZEM) 30 MG tablet Take 1 tablet (30 mg total) by mouth 4 (four) times daily as needed.  . furosemide (LASIX) 20 MG tablet Take 1 tablet (20 mg total) by mouth daily.  . Melatonin 5 MG CAPS Take 2 each by mouth. 2 tabs each night  . montelukast  (SINGULAIR) 10 MG tablet Take 1 tablet (10 mg total) by mouth at bedtime.  . Multiple Vitamin (MULTIVITAMIN) tablet Take 1 tablet by mouth daily.    . OXYGEN Inhale 4 L into the lungs continuous.   . potassium chloride (KLOR-CON) 10 MEQ tablet Take 1 tablet (10 mEq total) by mouth daily.  . predniSONE (DELTASONE) 10 MG tablet Take 1 tablet by mouth once daily with breakfast  . revefenacin (YUPELRI) 175 MCG/3ML nebulizer solution Take 3 mLs (175 mcg total) by nebulization daily.  . Revefenacin (YUPELRI) 175 MCG/3ML SOLN Inhale 3 mLs into the lungs daily.  . rivaroxaban (XARELTO) 20 MG TABS tablet Take 1 tablet (20 mg total) by mouth daily with supper.  . traMADol (ULTRAM) 50 MG tablet Take 1 tablet (50 mg total) by mouth every 6 (six) hours as needed for moderate pain.  . valsartan (DIOVAN) 80 MG tablet Take 1 tablet (80 mg total) by mouth daily.  . VENTOLIN HFA 108 (90 Base) MCG/ACT  inhaler INHALE 2 PUFFS EVERY 4 HOURS AS NEEDED   No facility-administered encounter medications on file as of 08/08/2020.    Thank you for the opportunity to participate in the care of [name] The palliative care team will continue to follow. Please call our office at [phone] if we can be of additional assistance.  Jari Favre DNP, AGPCNP-BC

## 2020-08-08 NOTE — Telephone Encounter (Signed)
Spoke with patient's daughter [name]. She stated that her mother has a long history of anxiety. For the past few days, her anxiety has been steadily increasing as well as her HR. She checked her heart rate yesterday and noticed it was in the 170s, even after taking the diltiazem 120 extended release capsule. She called cardiology and they prescribed an immediate release version of the diltiazem at 30mg . So far it has been working for her HR.   She has also been giving her her scheduled dose of clonazepam 1mg  in the mornings as well as night PRN.   I asked her if her mom was having any pulmonary symptoms during these episodes. She stated that only when her HR is elevated, she complains of increased SOB. She is on 4L of O2. Her O2 stats have always been in the 90s. She is still using the Yupelri, Pulmicort and Brovana as prescribed. She is also using the albuterol solution as needed for SOB. She had been advised that this will also cause an elevation of HR and she wonders if this needs to be changed to something else.   She is not sure if these are panic attacks.   TP, can you please advise since RB is not in the office? Thanks!

## 2020-08-08 NOTE — Telephone Encounter (Signed)
Patient with progressive decline with End Stage COPD  Now with tachycardia unclear etiology (suspect AFIB with RVR ) , has reached out to cardiology , Cardizem 30mg  Four times a day  Added .Marland Kitchen HR comes down but goes back into 170s  Does not want to go to hosptial  Declines ER or office visit, Can not get out of bed. Wants to be at home . Wants hospice to help with meds to ease struggle with breathing and severe anxiety .  No increased cough, congestion , hemoptysis, chest pain, fever .   Spoke with [place] daughter.   Advised :  Can we transition from Pallative to [place] for additional instruction for tachycardia .  Cont pulmonary meds.   Please contact office for sooner follow up if symptoms do not improve or worsen or seek emergency care

## 2020-08-08 NOTE — Telephone Encounter (Signed)
Amb referral to hospice order placed.  Reached out to [facility] and [facility] coordinated with TP and Hospice to get patient seen.  Nothing further needed.

## 2020-08-08 NOTE — Telephone Encounter (Signed)
Printed last OV, will fax OV to [facility].

## 2020-08-08 NOTE — Telephone Encounter (Signed)
Order for hospice placed and to see patient tomorrow  Spoke with daughter, patient more calm this evening and improved.

## 2020-08-09 ENCOUNTER — Other Ambulatory Visit: Payer: Self-pay

## 2020-08-09 DIAGNOSIS — R159 Full incontinence of feces: Secondary | ICD-10-CM | POA: Diagnosis not present

## 2020-08-09 DIAGNOSIS — Z6825 Body mass index (BMI) 25.0-25.9, adult: Secondary | ICD-10-CM | POA: Diagnosis not present

## 2020-08-09 DIAGNOSIS — M8589 Other specified disorders of bone density and structure, multiple sites: Secondary | ICD-10-CM | POA: Diagnosis not present

## 2020-08-09 DIAGNOSIS — J302 Other seasonal allergic rhinitis: Secondary | ICD-10-CM | POA: Diagnosis not present

## 2020-08-09 DIAGNOSIS — Z92240 Personal history of inhaled steroid therapy: Secondary | ICD-10-CM | POA: Diagnosis not present

## 2020-08-09 DIAGNOSIS — G43909 Migraine, unspecified, not intractable, without status migrainosus: Secondary | ICD-10-CM | POA: Diagnosis not present

## 2020-08-09 DIAGNOSIS — R32 Unspecified urinary incontinence: Secondary | ICD-10-CM | POA: Diagnosis not present

## 2020-08-09 DIAGNOSIS — J9611 Chronic respiratory failure with hypoxia: Secondary | ICD-10-CM | POA: Diagnosis not present

## 2020-08-09 DIAGNOSIS — Z9981 Dependence on supplemental oxygen: Secondary | ICD-10-CM | POA: Diagnosis not present

## 2020-08-09 DIAGNOSIS — S22009D Unspecified fracture of unspecified thoracic vertebra, subsequent encounter for fracture with routine healing: Secondary | ICD-10-CM | POA: Diagnosis not present

## 2020-08-09 DIAGNOSIS — I4891 Unspecified atrial fibrillation: Secondary | ICD-10-CM | POA: Diagnosis not present

## 2020-08-09 DIAGNOSIS — J449 Chronic obstructive pulmonary disease, unspecified: Secondary | ICD-10-CM | POA: Diagnosis not present

## 2020-08-09 DIAGNOSIS — N3281 Overactive bladder: Secondary | ICD-10-CM | POA: Diagnosis not present

## 2020-08-09 DIAGNOSIS — F410 Panic disorder [episodic paroxysmal anxiety] without agoraphobia: Secondary | ICD-10-CM | POA: Diagnosis not present

## 2020-08-09 DIAGNOSIS — H3320 Serous retinal detachment, unspecified eye: Secondary | ICD-10-CM | POA: Diagnosis not present

## 2020-08-09 DIAGNOSIS — I10 Essential (primary) hypertension: Secondary | ICD-10-CM | POA: Diagnosis not present

## 2020-08-09 DIAGNOSIS — Z87891 Personal history of nicotine dependence: Secondary | ICD-10-CM | POA: Diagnosis not present

## 2020-08-10 DIAGNOSIS — F410 Panic disorder [episodic paroxysmal anxiety] without agoraphobia: Secondary | ICD-10-CM | POA: Diagnosis not present

## 2020-08-10 DIAGNOSIS — I10 Essential (primary) hypertension: Secondary | ICD-10-CM | POA: Diagnosis not present

## 2020-08-10 DIAGNOSIS — J449 Chronic obstructive pulmonary disease, unspecified: Secondary | ICD-10-CM | POA: Diagnosis not present

## 2020-08-10 DIAGNOSIS — S22009D Unspecified fracture of unspecified thoracic vertebra, subsequent encounter for fracture with routine healing: Secondary | ICD-10-CM | POA: Diagnosis not present

## 2020-08-10 DIAGNOSIS — I4891 Unspecified atrial fibrillation: Secondary | ICD-10-CM | POA: Diagnosis not present

## 2020-08-10 DIAGNOSIS — J9611 Chronic respiratory failure with hypoxia: Secondary | ICD-10-CM | POA: Diagnosis not present

## 2020-08-12 ENCOUNTER — Other Ambulatory Visit: Payer: Self-pay

## 2020-08-12 MED ORDER — VALSARTAN 80 MG PO TABS: 80.0000 mg | ORAL_TABLET | Freq: Every day | ORAL | 1 refills | Status: AC

## 2020-08-13 NOTE — Progress Notes (Signed)
[facility] PALLIATIVE CARE RN NOTE  PATIENT NAME: Ariel Gonzalez DOB: 1941/05/23 MRN: [id number]  PRIMARY CARE PROVIDER: Gaynelle Arabian, MD  RESPONSIBLE PARTY: Olam Idler (daughter) Acct ID - Guarantor Home Phone Work Phone Relationship Acct Type  [id number] [phone]  Self P/F     [address], [place], [place] [address]   Due to the COVID-19 crisis, this virtual check-in visit was done via telephone from my office and it was initiated and consent by this patient and or family.  PLAN OF CARE and INTERVENTION:  1. ADVANCE CARE PLANNING/GOALS OF CARE: Goal is for patient to remain at home and avoid hospitalizations. She has a DNR. 2. PATIENT/CAREGIVER EDUCATION: Symptom management, dyspnea management, safe mobility, fall prevention 3. DISEASE STATUS: Virtual check-in visit completed via telephone. Patient denies pain at this time. Daughter states that patient is declining more and spending more time in bed. She is progressively weaker and had another fall recently and sustained a skin tear to her leg. She is requiring more assistance with ADLs. She is becoming more dyspneic with any activity. She is oxygen dependent at 4.5L/min via [place]. Daughter states that she is trying to get patient to walk to maintain strength. Explained that she should shift to ways to conserve her energy such as transporting her vs having her ambulate. She is having periods of increased anxiety also. She had a recent episode of shortness of breath and continual shaking with just transferring. She is currently taking Klonopin 1 mg daily in the morning. Per instructions she may take twice daily. Recommended that she try doing this routinely to help. She is also taking her Pulmicort, Brovana and Budesonide nebulizers as directed. She takes her Albuterol 1-2x daily. Discussed with daughter that she may have this every 6 hours as needed. She is becoming more forgetful and confused. Patient has finally agreed to  having 24/7 caregivers. Her brother has stayed with patient for the past couple of nights. Will continue to monitor.   HISTORY OF PRESENT ILLNESS: This is a 80 yo female with a history of COPD, Afib, hyperlipidemia and anxiety. Palliative care team continues to follow patient. Will visit patient monthly and PRN.     CODE STATUS: DNR ADVANCED DIRECTIVES: Y MOST FORM: yes PPS: 30%   (Duration of visit and documentation 30 minutes)   Daryl Eastern, RN BSN

## 2020-08-13 DEATH — deceased
